# Patient Record
Sex: Female | Born: 1970 | Race: Black or African American | Hispanic: No | Smoking: Never smoker
Health system: Southern US, Community
[De-identification: ages and names within clinical notes are randomized; demographics above are authoritative.]

## PROBLEM LIST (undated history)

## (undated) DIAGNOSIS — M26629 Arthralgia of temporomandibular joint, unspecified side: Secondary | ICD-10-CM

## (undated) DIAGNOSIS — E559 Vitamin D deficiency, unspecified: Secondary | ICD-10-CM

## (undated) DIAGNOSIS — I2699 Other pulmonary embolism without acute cor pulmonale: Secondary | ICD-10-CM

## (undated) DIAGNOSIS — E669 Obesity, unspecified: Secondary | ICD-10-CM

## (undated) DIAGNOSIS — G9332 Myalgic encephalomyelitis/chronic fatigue syndrome: Secondary | ICD-10-CM

## (undated) DIAGNOSIS — M2550 Pain in unspecified joint: Secondary | ICD-10-CM

## (undated) DIAGNOSIS — D649 Anemia, unspecified: Secondary | ICD-10-CM

## (undated) DIAGNOSIS — F419 Anxiety disorder, unspecified: Secondary | ICD-10-CM

## (undated) DIAGNOSIS — H15009 Unspecified scleritis, unspecified eye: Secondary | ICD-10-CM

## (undated) DIAGNOSIS — F329 Major depressive disorder, single episode, unspecified: Secondary | ICD-10-CM

## (undated) DIAGNOSIS — R0600 Dyspnea, unspecified: Secondary | ICD-10-CM

## (undated) DIAGNOSIS — R600 Localized edema: Secondary | ICD-10-CM

## (undated) DIAGNOSIS — E119 Type 2 diabetes mellitus without complications: Secondary | ICD-10-CM

## (undated) DIAGNOSIS — M549 Dorsalgia, unspecified: Secondary | ICD-10-CM

## (undated) DIAGNOSIS — D219 Benign neoplasm of connective and other soft tissue, unspecified: Secondary | ICD-10-CM

## (undated) DIAGNOSIS — R5382 Chronic fatigue, unspecified: Secondary | ICD-10-CM

## (undated) DIAGNOSIS — J45909 Unspecified asthma, uncomplicated: Secondary | ICD-10-CM

## (undated) DIAGNOSIS — K219 Gastro-esophageal reflux disease without esophagitis: Secondary | ICD-10-CM

## (undated) DIAGNOSIS — K5900 Constipation, unspecified: Secondary | ICD-10-CM

## (undated) DIAGNOSIS — F32A Depression, unspecified: Secondary | ICD-10-CM

## (undated) DIAGNOSIS — G43909 Migraine, unspecified, not intractable, without status migrainosus: Secondary | ICD-10-CM

## (undated) DIAGNOSIS — R7303 Prediabetes: Secondary | ICD-10-CM

## (undated) DIAGNOSIS — E8881 Metabolic syndrome: Secondary | ICD-10-CM

## (undated) DIAGNOSIS — M1710 Unilateral primary osteoarthritis, unspecified knee: Secondary | ICD-10-CM

## (undated) HISTORY — DX: Localized edema: R60.0

## (undated) HISTORY — DX: Myalgic encephalomyelitis/chronic fatigue syndrome: G93.32

## (undated) HISTORY — DX: Prediabetes: R73.03

## (undated) HISTORY — DX: Dyspnea, unspecified: R06.00

## (undated) HISTORY — PX: SHOULDER SURGERY: SHX246

## (undated) HISTORY — DX: Depression, unspecified: F32.A

## (undated) HISTORY — DX: Constipation, unspecified: K59.00

## (undated) HISTORY — DX: Unspecified scleritis, unspecified eye: H15.009

## (undated) HISTORY — DX: Pain in unspecified joint: M25.50

## (undated) HISTORY — DX: Unilateral primary osteoarthritis, unspecified knee: M17.10

## (undated) HISTORY — DX: Vitamin D deficiency, unspecified: E55.9

## (undated) HISTORY — DX: Migraine, unspecified, not intractable, without status migrainosus: G43.909

## (undated) HISTORY — DX: Anxiety disorder, unspecified: F41.9

## (undated) HISTORY — DX: Metabolic syndrome: E88.810

## (undated) HISTORY — PX: ABLATION: SHX5711

## (undated) HISTORY — DX: Gastro-esophageal reflux disease without esophagitis: K21.9

## (undated) HISTORY — DX: Chronic fatigue, unspecified: R53.82

## (undated) HISTORY — DX: Metabolic syndrome: E88.81

## (undated) HISTORY — DX: Dorsalgia, unspecified: M54.9

---
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
## 1997-11-25 ENCOUNTER — Emergency Department (HOSPITAL_COMMUNITY): Admission: EM | Admit: 1997-11-25 | Discharge: 1997-11-25 | Payer: Self-pay | Admitting: Emergency Medicine

## 1997-11-25 ENCOUNTER — Encounter: Payer: Self-pay | Admitting: Emergency Medicine

## 1999-05-06 ENCOUNTER — Encounter: Payer: Self-pay | Admitting: Obstetrics & Gynecology

## 1999-05-06 ENCOUNTER — Ambulatory Visit (HOSPITAL_COMMUNITY): Admission: RE | Admit: 1999-05-06 | Discharge: 1999-05-06 | Payer: Self-pay | Admitting: Obstetrics & Gynecology

## 1999-07-21 ENCOUNTER — Inpatient Hospital Stay (HOSPITAL_COMMUNITY): Admission: AD | Admit: 1999-07-21 | Discharge: 1999-07-21 | Payer: Self-pay | Admitting: *Deleted

## 1999-07-22 ENCOUNTER — Inpatient Hospital Stay (HOSPITAL_COMMUNITY): Admission: AD | Admit: 1999-07-22 | Discharge: 1999-07-24 | Payer: Self-pay | Admitting: Obstetrics and Gynecology

## 1999-08-29 ENCOUNTER — Emergency Department (HOSPITAL_COMMUNITY): Admission: EM | Admit: 1999-08-29 | Discharge: 1999-08-29 | Payer: Self-pay | Admitting: *Deleted

## 2000-02-18 ENCOUNTER — Emergency Department (HOSPITAL_COMMUNITY): Admission: EM | Admit: 2000-02-18 | Discharge: 2000-02-18 | Payer: Self-pay | Admitting: Emergency Medicine

## 2000-02-18 ENCOUNTER — Encounter: Payer: Self-pay | Admitting: Emergency Medicine

## 2000-09-07 ENCOUNTER — Other Ambulatory Visit: Admission: RE | Admit: 2000-09-07 | Discharge: 2000-09-07 | Payer: Self-pay | Admitting: Obstetrics and Gynecology

## 2000-10-04 ENCOUNTER — Inpatient Hospital Stay (HOSPITAL_COMMUNITY): Admission: AD | Admit: 2000-10-04 | Discharge: 2000-10-04 | Payer: Self-pay | Admitting: Obstetrics and Gynecology

## 2000-10-16 ENCOUNTER — Inpatient Hospital Stay (HOSPITAL_COMMUNITY): Admission: AD | Admit: 2000-10-16 | Discharge: 2000-10-16 | Payer: Self-pay | Admitting: Obstetrics and Gynecology

## 2000-11-27 ENCOUNTER — Ambulatory Visit (HOSPITAL_COMMUNITY): Admission: RE | Admit: 2000-11-27 | Discharge: 2000-11-27 | Payer: Self-pay | Admitting: Obstetrics and Gynecology

## 2000-11-27 ENCOUNTER — Encounter: Payer: Self-pay | Admitting: Obstetrics and Gynecology

## 2001-01-03 HISTORY — PX: TUBAL LIGATION: SHX77

## 2001-01-12 ENCOUNTER — Encounter: Payer: Self-pay | Admitting: Obstetrics and Gynecology

## 2001-01-12 ENCOUNTER — Ambulatory Visit (HOSPITAL_COMMUNITY): Admission: RE | Admit: 2001-01-12 | Discharge: 2001-01-12 | Payer: Self-pay | Admitting: Obstetrics and Gynecology

## 2001-03-09 ENCOUNTER — Encounter: Payer: Self-pay | Admitting: Obstetrics and Gynecology

## 2001-03-09 ENCOUNTER — Ambulatory Visit (HOSPITAL_COMMUNITY): Admission: RE | Admit: 2001-03-09 | Discharge: 2001-03-09 | Payer: Self-pay | Admitting: Obstetrics and Gynecology

## 2001-04-26 ENCOUNTER — Inpatient Hospital Stay (HOSPITAL_COMMUNITY): Admission: AD | Admit: 2001-04-26 | Discharge: 2001-04-28 | Payer: Self-pay | Admitting: Obstetrics and Gynecology

## 2001-06-15 ENCOUNTER — Ambulatory Visit (HOSPITAL_COMMUNITY): Admission: RE | Admit: 2001-06-15 | Discharge: 2001-06-15 | Payer: Self-pay | Admitting: Obstetrics and Gynecology

## 2001-08-26 ENCOUNTER — Emergency Department (HOSPITAL_COMMUNITY): Admission: EM | Admit: 2001-08-26 | Discharge: 2001-08-26 | Payer: Self-pay | Admitting: Emergency Medicine

## 2002-07-09 ENCOUNTER — Inpatient Hospital Stay (HOSPITAL_COMMUNITY): Admission: AD | Admit: 2002-07-09 | Discharge: 2002-07-09 | Payer: Self-pay | Admitting: Obstetrics and Gynecology

## 2003-04-11 ENCOUNTER — Emergency Department (HOSPITAL_COMMUNITY): Admission: EM | Admit: 2003-04-11 | Discharge: 2003-04-11 | Payer: Self-pay | Admitting: Emergency Medicine

## 2003-04-17 ENCOUNTER — Encounter: Admission: RE | Admit: 2003-04-17 | Discharge: 2003-05-01 | Payer: Self-pay | Admitting: Orthopedic Surgery

## 2005-03-19 ENCOUNTER — Emergency Department (HOSPITAL_COMMUNITY): Admission: AD | Admit: 2005-03-19 | Discharge: 2005-03-19 | Payer: Self-pay | Admitting: Family Medicine

## 2008-01-31 ENCOUNTER — Emergency Department (HOSPITAL_COMMUNITY): Admission: EM | Admit: 2008-01-31 | Discharge: 2008-01-31 | Payer: Self-pay | Admitting: Emergency Medicine

## 2008-02-01 ENCOUNTER — Emergency Department (HOSPITAL_COMMUNITY): Admission: EM | Admit: 2008-02-01 | Discharge: 2008-02-01 | Payer: Self-pay | Admitting: Family Medicine

## 2008-02-01 ENCOUNTER — Emergency Department (HOSPITAL_COMMUNITY): Admission: EM | Admit: 2008-02-01 | Discharge: 2008-02-01 | Payer: Self-pay | Admitting: Emergency Medicine

## 2008-12-29 ENCOUNTER — Encounter: Admission: RE | Admit: 2008-12-29 | Discharge: 2008-12-29 | Payer: Self-pay | Admitting: Internal Medicine

## 2009-10-19 ENCOUNTER — Emergency Department (HOSPITAL_COMMUNITY): Admission: EM | Admit: 2009-10-19 | Discharge: 2009-10-20 | Payer: Self-pay | Admitting: Emergency Medicine

## 2010-05-21 NOTE — Op Note (Signed)
[facility]  Patient:    Jaime Castaneda, Jaime Castaneda Visit Number: [id number] MRN: [id number]          Service Type: DSU Location: [facility] Attending Physician:  Jaymes Graff A Dictated by:   Pierre Bali Normand Sloop, M.D. Proc. Date: 06/15/01 Admit Date:  06/15/2001                             Operative Report  PREOPERATIVE DIAGNOSIS:       Multiparity, desires sterilization.  POSTOPERATIVE DIAGNOSIS:      Multiparity, desires sterilization.  PROCEDURE:                    Tubal ligation with bipolar cautery using Klepingers.  SURGEON:                      Naima A. Normand Sloop, M.D.  ANESTHESIA:                   General endotracheal.  ESTIMATED BLOOD LOSS:         Minimal.  COMPLICATIONS:                None.  FINDINGS:                     Normal pelvic anatomy.  Normal-appearing, anteverted uterus.  Normal-appearing tubes and ovaries.  Normal appendix. Normal-appearing abdominal anatomy.  Normal-appearing liver.  On exam, the vulva and vagina were within normal limits.  The patient had a normal-sized, anteverted uterus with no adnexal masses.  PROCEDURE IN DETAIL:          The patient was taken to the operating room, where she was given general anesthesia, placed in the dorsal lithotomy position, prepped and draped in a normal sterile fashion.  A bivalve speculum was placed into the vagina.  The anterior lip of the cervix was grasped with a single-tooth tenaculum.  An acorn manipulator was then placed into the cervix as a means of manipulating the uterus.  The bivalve speculum was then removed.  Attention was then turned to the patients abdomen, where a 10-mm infraumbilical horizontal incision was made with a knife.  A Veress needle was placed into the abdominal cavity while tenting the abdominal wall at a 45-degree angle.  Intra-abdominal placement was confirmed with a fluid-filled syringe and decrease in CO2 pressure.  The patient was insufflated with about 4  L of CO2 gas.  A 10-mm trocar was then placed without difficulty. Intra-abdominal pressure was confirmed with the laparoscope.  Before the incision was made, 7 cc of 0.25% Marcaine was injected into the skin incision site about 2 cm above the symphysis pubis.  A total of 3 cc of 0.25% Marcaine was injected.  A small, 5-mm infraumbilical incision was made and a 5-mm trocar was placed under direct visualization with the laparoscope.  The abdominal anatomy was as above.  The right fallopian tube was identified, followed out to the fimbriated end and cauterized x3 in the midisthmic portion of the tube.  Hemostasis was assured.  The left fallopian tube was identified, followed out to the fimbriated end and, in a similar fashion, was cauterized x3.  Hemostasis was assured.  All instruments were removed from the abdomen. The trocars were removed under direct visualization with the laparoscope and noted to be hemostatic.  The umbilical fascia was closed with figure-of-eight stitch.  The skin incisions were  closed with subcuticular stitches of 3-0 Vicryl.  Again, the patient had normal tubes, ovaries and normal uterus. Sponge, lap and needle counts were correct x2.  The tenaculum and acorn were removed, with good hemostasis noted at the cervix and tenaculum site.  The patient went to the recovery room in stable condition. Dictated by:   Pierre Bali. Normand Sloop, M.D. Attending Physician:  Jaymes Graff A DD:  06/15/01 TD:  06/17/01 Job: [id number]

## 2010-05-21 NOTE — H&P (Signed)
[facility]  Patient:    Jaime Castaneda, Jaime Castaneda Visit Number: [id number] MRN: [id number]          Service Type: OBS Location: 910A 9118 01 Attending Physician:  Leonard Schwartz Dictated by:   Saverio Danker, C.N.M. Admit Date:  04/26/2001 Discharge Date: 04/28/2001                           History and Physical  HISTORY OF PRESENT ILLNESS:   Jaime Castaneda is a 40 year old married black female, gravida 2, para 1-0-0-1, at 39-5/7ths weeks, who presents complaining of uterine contractions every 3-5 minutes from about 2 a.m. and leaking clear fluid since about 9 p.m.  She denies any nausea, vomiting, headaches or visual disturbances.  She reports positive fetal movement.  Her pregnancy has been followed at [facility] by the certified nurse midwife service and has been at risk for: 1. Obesity.  2. History of rapid labor.  3. History of questionable LMP and irregular cycles.  4. History of hyperemesis with this pregnancy.  5. History of placenta previa, now resolved.  Her group B strep is negative with this pregnancy.  OBSTETRICAL/GYNECOLOGICAL HISTORY:                      She is a gravida 2, para 1-0-0-1 with an EDC of April 28, 2001, by ultrasound.  She delivered a viable female infant in July of 2001 who weighed 8 pounds 5 ounces at 41-2/7ths weeks after a five-hour labor. She delivered vaginally with no complications and actually delivered precipitously and was attended in labor by Dr. Trudie Reed.  OTHER GYNECOLOGICAL HISTORY:  She reports a history of abnormal Pap several years ago; her repeat, however, was normal and no further treatment was necessary.  She reports occasional yeast infections.  GENERAL MEDICAL HISTORY:      She has no known drug allergies.  She reports having had the usual childhood diseases.  She has a history of anemia in the past and her only surgery was a hernia repair at age 20 months and her  only hospitalization was for childbirth.  FAMILY HISTORY:               Significant only for maternal grandfather with mitral valve prolapse and mother and aunt with chronic hypertension.  GENETIC HISTORY:              Negative.  SOCIAL HISTORY:               She is married to Dionicia Abler who is involved and supportive.  They are both employed full time.  They are of the [place] faith.  They deny any illicit drug use, alcohol or smoking with this pregnancy.  PRENATAL LABORATORY:          Her blood type is A positive.  Her antibody screen is negative.  Sickle cell trait is negative.  Syphilis is nonreactive. Rubella is immune.  Hepatitis B surface antigen is negative.  GC and Chlamydia are both negative.  Pap was within normal limits.  Her one-hour Glucola is 101 and her 36-week beta strep was negative.  PHYSICAL EXAMINATION:  VITAL SIGNS:                  Her vital signs are stable, she is afebrile.  HEENT:  Grossly within normal limits.  HEART:                        Regular rhythm and rate.  CHEST:                        Clear.  BREASTS:                      Soft and nontender.  ABDOMEN:                      Gravid with uterine contractions every 3-5 minutes.  Her fetal heart rate is reactive and reassuring.  PELVIC EXAM:                  Reveals clear fluid, fern and Nitrazine positive and the cervix is 3 cm, 50%, vertex at a -2 station.  EXTREMITIES:                  Within normal limits.  ASSESSMENT:                   1. Intrauterine pregnancy at term.                               2. Spontaneous rupture of membranes since about                                  9 p.m. with clear fluid.                               3. Negative group B Streptococcus.                               4. Early labor.  PLAN:                         Admit to labor and delivery, to follow routine CNM orders, to allow the patient to ambulate at this time to augment  labor, and the patient plans on an epidural as she progresses in labor.  We also notified Dr. Janine Limbo of patients admission. Dictated by:   Vance Gather Duplantis, C.N.M. Attending Physician:  Leonard Schwartz DD:  04/26/01 TD:  04/26/01 Job: [phone] [id number]

## 2010-05-21 NOTE — H&P (Signed)
[facility]  Patient:    Jaime Castaneda, Jaime Castaneda Visit Number: [id number] MRN: [id number]          Service Type: OBS Location: 910A 9118 01 Attending Physician:  Leonard Schwartz Dictated by:   Pierre Bali Normand Sloop, M.D. Admit Date:  04/26/2001 Discharge Date: 04/28/2001                           History and Physical  CHIEF COMPLAINT:              1. Desires sterilization.                               2. Status post vaginal delivery on                                  April 26, 2001.  HISTORY OF PRESENT ILLNESS:   The patient is a 40 year old, G2, P2, who desires sterilization.  All forms of birth control were reviewed, but the patient still desires sterilization.  ALLERGIES:                    No known drug allergies.  MEDICATIONS:                  Prenatal vitamins and iron sulfate.  PAST MEDICAL HISTORY:         Unremarkable.  FAMILY HISTORY:               Unremarkable.  PHYSICAL EXAMINATION:  VITAL SIGNS:                  Temperature 97.5, pulse 88, respiratory rate 16, blood pressure 139/66.  GENERAL:                      She is a well-developed, well-nourished female in no apparent distress.  HEENT:                        Head is normocephalic, atraumatic.  NECK:                         Free range of motion.  LUNGS:                        Clear to auscultation bilaterally.  HEART:                        Regular rate and rhythm.  ABDOMEN:                      Soft and nontender.  GENITAL:                      Deferred into OR but found to be normal in the office at her last exam.  Vulva and vaginal exam was normal.  Cervix without lesions, normal-appearing.  Uterus is normal size, anteverted, and nontender. Adnexa has no masses bilaterally.  EXTREMITIES:                  No clubbing, cyanosis, or edema.  SKIN:  Moist mucous membranes.  IMPRESSION:                   The patient is multiparity and  desires sterilization.  PLAN:                         Bilateral tubal ligation by way of laparoscopy. The risk of failure rate of about 1 in 200 to 1 in 300 was discussed with the patient.  The risk of the surgery was discussed with the patient as they are, but not limited to, bleeding, infection, damage to internal organs such as bowel and bladder.  All forms of birth control were reviewed.  Once again, the patient still chose to have tubal ligation. Dictated by:   Pierre Bali. Normand Sloop, M.D. Attending Physician:  Leonard Schwartz DD:  06/15/01 TD:  06/15/01 Job: [id number]

## 2010-05-21 NOTE — H&P (Signed)
[facility]  Patient:    Jaime Castaneda, Jaime Castaneda                        MRN: [id number] Adm. Date:  [id number] Attending:  Shaune Spittle Dictator:   Mack Guise, C.N.M.                         History and Physical  HISTORY OF PRESENT ILLNESS:   Ms. Jaime Castaneda is a 40 year old gravida 1, para 0 at 41-3/7th weeks, EDD July 13, 1999, who presents with increasing labor.  She is scheduled for induction of labor already at 6 a.m. this morning.  The patient had been seen previously in MAU at approximately midnight and was 2 cm at that time and went home with Ambien.  She reports positive fetal movement, no bleeding, no rupture of membranes.  Her pregnancy has been followed by the M.D. service at [facility] and has been unremarkable. She transferred to care to our practice in the second trimester and is group B strep negative.  This patient was initially evaluated at the office of [facility] on December 02, 1998 at approximately [redacted] weeks gestation.  Her pregnancy was confirmed with pregnancy ultrasonography and has remained essentially unremarkable.  She transferred care to [facility] in the early second trimester and her pregnancy has been unremarkable throughout the prenatal course.  The growth of the uterine fundus has been noted to be compatible in size with her dates.  Systolic blood pressure has ranged from 100 to 130 and diastolic blood pressure has ranged from 60 to 80.  There has been no significant proteinuria. At 36 weeks culture of the vaginal tract is negative for group B strep.  MEDICAL HISTORY:              Unremarkable.  OBJECTIVE DATA  VITAL SIGNS:                  Stable.  Afebrile.  HEENT:                        Unremarkable.  HEART:                        Regular rate and rhythm.  LUNGS:                        Clear.  ABDOMEN:                      Gravid in its contour.  Uterine fundus is noted to extend 40 cm above  the level of the pubic symphysis.  Leopold maneuver finds the infant to be in longitudinal lie, cephalic presentation and the estimated fetal weight is 8 pounds.  CERVICAL EXAM:                Digital exam of the cervix by R.N. in MAU finds it to be 3 to 4 cm dilated, 90% effaced with cephalic presenting part at a -1 station.  The baseline of the fetal heart rate monitor is 130 to 140 with average long-term variability.  Reactivity present with no periodic changes. The patient is contracting every 2 to 5 minutes.  ASSESSMENT:                   1. Intrauterine pregnancy at 41-3/7th weeks.  2. Early labor.  PLAN:                         Admit per Dr. Dierdre Forth.  The patient may ambulate.  She may have Stadol 1 mg plus Phenergan 12.5 mg IV x 1 as needed for pain.  Routine M.D. orders.  If no labor progress by 6 a.m., will begin Pitocin for augmentation of labor. DD:  07/22/99 TD:  07/22/99 Job: [id number]

## 2010-10-18 DIAGNOSIS — R030 Elevated blood-pressure reading, without diagnosis of hypertension: Secondary | ICD-10-CM | POA: Insufficient documentation

## 2010-11-08 ENCOUNTER — Emergency Department (HOSPITAL_BASED_OUTPATIENT_CLINIC_OR_DEPARTMENT_OTHER)
Admission: EM | Admit: 2010-11-08 | Discharge: 2010-11-08 | Disposition: A | Discharge status: Home / Self Care | Payer: Medicaid Other | Attending: Emergency Medicine | Admitting: Emergency Medicine

## 2010-11-08 ENCOUNTER — Encounter: Payer: Self-pay | Admitting: *Deleted

## 2010-11-08 ENCOUNTER — Emergency Department (INDEPENDENT_AMBULATORY_CARE_PROVIDER_SITE_OTHER): Payer: Medicaid Other

## 2010-11-08 DIAGNOSIS — R29810 Facial weakness: Secondary | ICD-10-CM

## 2010-11-08 HISTORY — DX: Arthralgia of temporomandibular joint, unspecified side: M26.629

## 2010-11-08 LAB — CBC
Hemoglobin: 9.7 g/dL — ABNORMAL LOW (ref 12.0–15.0)
MCH: 23.1 pg — ABNORMAL LOW (ref 26.0–34.0)
MCV: 76.4 fL — ABNORMAL LOW (ref 78.0–100.0)
RBC: 4.2 MIL/uL (ref 3.87–5.11)

## 2010-11-08 LAB — COMPREHENSIVE METABOLIC PANEL
Alkaline Phosphatase: 60 U/L (ref 39–117)
BUN: 6 mg/dL (ref 6–23)
CO2: 26 mEq/L (ref 19–32)
GFR calc Af Amer: >90 mL/min (ref >90)
GFR calc non Af Amer: >90 mL/min (ref >90)
Glucose, Bld: 93 mg/dL (ref 70–99)
Potassium: 3.2 mEq/L — ABNORMAL LOW (ref 3.5–5.1)
Total Protein: 8.5 g/dL — ABNORMAL HIGH (ref 6.0–8.3)

## 2010-11-08 LAB — DIFFERENTIAL
Basophils Relative: 0 % (ref 0–1)
Eosinophils Absolute: 0.1 10*3/uL (ref 0.0–0.7)
Eosinophils Relative: 1 % (ref 0–5)
Lymphs Abs: 2 10*3/uL (ref 0.7–4.0)
Monocytes Absolute: 0.3 10*3/uL (ref 0.1–1.0)
Monocytes Relative: 4 % (ref 3–12)
Neutrophils Relative %: 68 % (ref 43–77)

## 2010-11-08 MED ORDER — ALPRAZOLAM 0.5 MG PO TABS: 0.5000 mg | ORAL_TABLET | Freq: Every evening | ORAL | Status: AC | PRN

## 2010-11-08 NOTE — ED Notes (Signed)
MD at bedside. 

## 2010-11-08 NOTE — ED Provider Notes (Addendum)
History    Scribed for Geoffery Lyons, MD, the patient was seen in room MH08/MH08. This chart was scribed by Katha Cabal.   [name]: [id number] Arrival date & time: 11/08/2010  4:35 PM   First MD Initiated Contact with Patient 11/08/10 1638      Chief Complaint  Patient presents with  . Facial Droop    (Consider location/radiation/quality/duration/timing/severity/associated sxs/prior treatment) HPI Jaime Castaneda is a 40 y.o. female who presents to the Emergency Department complaining of sudden onset of mild right facial droop last night.  Episode is unchanged.  Patient denies all pain, numbness and weakness.  Patient denies blurred vision but repots seeing a "light on right side in my peripheral vision."    Patient states "jawline just wants to go that way"   Patient reports dental work last week with several numbing injections.  Patient evaluated by PCP who told patient she had elevated BP.  Patient reports elevated stress with small children at home and stress at her job.  Patient states "feel like I need a good night rest." Patient was unable to get appointment today and was referred to ED by [facility].  Patient was diagnosed with TMJ several years ago.       Past Medical History  Diagnosis Date  . TMJ arthralgia     Past Surgical History  Procedure Date  . Tubal ligation   . Shoulder surgery     No family history on file.  History  Substance Use Topics  . Smoking status: Never Smoker   . Smokeless tobacco: Not on file  . Alcohol Use: No    OB History    Grav Para Term Preterm Abortions TAB SAB Ect Mult Living                  Review of Systems  All other systems reviewed and are negative.    Allergies  Review of patient's allergies indicates no known allergies.  Home Medications   Current Outpatient Rx  Name Route Sig Dispense Refill  . HAIR/SKIN/NAILS PO TABS Oral Take 1 tablet by mouth daily.        BP 136/85  Pulse 76  Temp(Src) 98 F  (36.7 C) (Oral)  Resp 20  Ht 5\' 8"  (1.727 m)  Wt 293 lb (132.904 kg)  BMI 44.55 kg/m2  SpO2 100%  LMP 11/08/2010  Physical Exam  Constitutional: She is oriented to person, place, and time. She appears well-developed and well-nourished. No distress.  HENT:  Head: Normocephalic and atraumatic.  Mouth/Throat: Oropharynx is clear and moist and mucous membranes are normal.  Eyes: Conjunctivae and EOM are normal. Pupils are equal, round, and reactive to light.  Neck: Normal range of motion. Neck supple.  Cardiovascular: Normal rate and regular rhythm.   Pulmonary/Chest: Effort normal and breath sounds normal. No respiratory distress.  Abdominal: There is no tenderness.  Musculoskeletal: Normal range of motion.  Neurological: She is alert and oriented to person, place, and time. She has normal strength. No cranial nerve deficit. Coordination normal.       No facial droop, no asymmetry, teeth aligned,   Skin: Skin is warm, dry and intact.  Psychiatric: She has a normal mood and affect. Her speech is normal and behavior is normal.    ED Course  Procedures (including critical care time)   DIAGNOSTIC STUDIES: Oxygen Saturation is 100% on room air, normal by my interpretation.    COORDINATION OF CARE:  4:55 PM  Physical exam complete.  Will order CT Head.   Orders Placed This Encounter  Procedures  . CT Head Wo Contrast  . CBC  . Differential  . Comprehensive metabolic panel    LABS / RADIOLOGY:   Labs Reviewed  CBC - Abnormal; Notable for the following:    Hemoglobin 9.7 (*)    HCT 32.1 (*)    MCV 76.4 (*)    MCH 23.1 (*)    RDW 16.7 (*)    Platelets 407 (*)    All other components within normal limits  COMPREHENSIVE METABOLIC PANEL - Abnormal; Notable for the following:    Potassium 3.2 (*)    Total Protein 8.5 (*)    Total Bilirubin 0.2 (*)    All other components within normal limits  DIFFERENTIAL   Ct Head Wo Contrast  11/08/2010  *RADIOLOGY REPORT*  Clinical  Data: Left facial droop  CT HEAD WITHOUT CONTRAST  Technique:  Contiguous axial images were obtained from the base of the skull through the vertex without contrast.  Comparison: CT sinus dated 12/29/2008  Findings: Motion degraded images.  No evidence of parenchymal hemorrhage or extra-axial fluid collection. No mass lesion, mass effect, or midline shift.  No CT evidence of acute infarction.  The visualized paranasal sinuses are essentially clear. The mastoid air cells are unopacified.  No evidence of calvarial fracture.  IMPRESSION: Motion degraded images.  No evidence of acute intracranial abnormality.  Original Report Authenticated By: Charline Bills, M.D.         MDM   MDM:  There are no neurological deficits on exam.  This does not appear to be a Bell's, or a CVA.  Labs, CT appear normal except for anemia which she normally has.  Will discharge, follow up with pcp if not improving.     MEDICATIONS GIVEN IN THE E.D. Scheduled Meds:   Continuous Infusions:       IMPRESSION: No diagnosis found.   DISCHARGE MEDICATIONS: New Prescriptions   No medications on file      I personally performed the services described in this documentation, which was scribed in my presence. The recorded information has been reviewed and considered.   Scribe            Geoffery Lyons, MD 11/08/10 1806  Geoffery Lyons, MD 11/08/10 [phone]

## 2010-11-08 NOTE — ED Notes (Signed)
Pt c/o right side facial drawing that began last night and has worsen today. Pt reports numbness/tingling in her left hand only. Pt sts she is having to bite down to keep her face from drawing. Pt also c/o slight dizziness and blurred vision.

## 2010-11-08 NOTE — ED Notes (Signed)
Pt sts hse has been under a lot of stress lately and was sent here by her PMD ([facility]).

## 2011-05-15 ENCOUNTER — Encounter (HOSPITAL_BASED_OUTPATIENT_CLINIC_OR_DEPARTMENT_OTHER): Payer: Self-pay | Admitting: Emergency Medicine

## 2011-05-15 ENCOUNTER — Emergency Department (HOSPITAL_BASED_OUTPATIENT_CLINIC_OR_DEPARTMENT_OTHER)
Admission: EM | Admit: 2011-05-15 | Discharge: 2011-05-15 | Disposition: A | Discharge status: Home / Self Care | Payer: BC Managed Care – PPO | Attending: Emergency Medicine | Admitting: Emergency Medicine

## 2011-05-15 DIAGNOSIS — J069 Acute upper respiratory infection, unspecified: Secondary | ICD-10-CM

## 2011-05-15 DIAGNOSIS — J029 Acute pharyngitis, unspecified: Secondary | ICD-10-CM | POA: Insufficient documentation

## 2011-05-15 DIAGNOSIS — J3489 Other specified disorders of nose and nasal sinuses: Secondary | ICD-10-CM | POA: Insufficient documentation

## 2011-05-15 DIAGNOSIS — R059 Cough, unspecified: Secondary | ICD-10-CM | POA: Insufficient documentation

## 2011-05-15 DIAGNOSIS — R05 Cough: Secondary | ICD-10-CM

## 2011-05-15 LAB — RAPID STREP SCREEN (MED CTR MEBANE ONLY): Streptococcus, Group A Screen (Direct): NEGATIVE

## 2011-05-15 MED ORDER — BENZONATATE 100 MG PO CAPS: 100.0000 mg | ORAL_CAPSULE | Freq: Three times a day (TID) | ORAL | Status: AC

## 2011-05-15 NOTE — Discharge Instructions (Signed)
Cough, Adult   A cough is a reflex that helps clear your throat and airways. It can help heal the body or may be a reaction to an irritated airway. A cough may only last 2 or 3 weeks (acute) or may last more than 8 weeks (chronic).   CAUSES  Acute cough:  · Viral or bacterial infections.  Chronic cough:  · Infections.  · Allergies.  · Asthma.  · Post-nasal drip.  · Smoking.  · Heartburn or acid reflux.  · Some medicines.  · Chronic lung problems (COPD).  · Cancer.  SYMPTOMS   · Cough.  · Fever.  · Chest pain.  · Increased breathing rate.  · High-pitched whistling sound when breathing (wheezing).  · Colored mucus that you cough up (sputum).  TREATMENT   · A bacterial cough may be treated with antibiotic medicine.  · A viral cough must run its course and will not respond to antibiotics.  · Your caregiver may recommend other treatments if you have a chronic cough.  HOME CARE INSTRUCTIONS   · Only take over-the-counter or prescription medicines for pain, discomfort, or fever as directed by your caregiver. Use cough suppressants only as directed by your caregiver.  · Use a cold steam vaporizer or humidifier in your bedroom or home to help loosen secretions.  · Sleep in a semi-upright position if your cough is worse at night.  · Rest as needed.  · Stop smoking if you smoke.  SEEK IMMEDIATE MEDICAL CARE IF:   · You have pus in your sputum.  · Your cough starts to worsen.  · You cannot control your cough with suppressants and are losing sleep.  · You begin coughing up blood.  · You have difficulty breathing.  · You develop pain which is getting worse or is uncontrolled with medicine.  · You have a fever.  MAKE SURE YOU:   · Understand these instructions.  · Will watch your condition.  · Will get help right away if you are not doing well or get worse.  Document Released: 06/18/2010 Document Revised: 12/09/2010 Document Reviewed: 06/18/2010  ExitCare® Patient Information ©2012 [employer].  Upper Respiratory Infection,  Adult  An upper respiratory infection (URI) is also sometimes known as the common cold. The upper respiratory tract includes the nose, sinuses, throat, trachea, and bronchi. Bronchi are the airways leading to the lungs. Most people improve within 1 week, but symptoms can last up to 2 weeks. A residual cough may last even longer.   CAUSES  Many different viruses can infect the tissues lining the upper respiratory tract. The tissues become irritated and inflamed and often become very moist. Mucus production is also common. A cold is contagious. You can easily spread the virus to others by oral contact. This includes kissing, sharing a glass, coughing, or sneezing. Touching your mouth or nose and then touching a surface, which is then touched by another person, can also spread the virus.  SYMPTOMS   Symptoms typically develop 1 to 3 days after you come in contact with a cold virus. Symptoms vary from person to person. They may include:  · Runny nose.  · Sneezing.  · Nasal congestion.  · Sinus irritation.  · Sore throat.  · Loss of voice (laryngitis).  · Cough.  · Fatigue.  · Muscle aches.  · Loss of appetite.  · Headache.  · Low-grade fever.  DIAGNOSIS   You might diagnose your own cold based on familiar symptoms, since   most people get a cold 2 to 3 times a year. Your caregiver can confirm this based on your exam. Most importantly, your caregiver can check that your symptoms are not due to another disease such as strep throat, sinusitis, pneumonia, asthma, or epiglottitis. Blood tests, throat tests, and X-rays are not necessary to diagnose a common cold, but they may sometimes be helpful in excluding other more serious diseases. Your caregiver will decide if any further tests are required.  RISKS AND COMPLICATIONS   You may be at risk for a more severe case of the common cold if you smoke cigarettes, have chronic heart disease (such as heart failure) or lung disease (such as asthma), or if you have a weakened immune  system. The very young and very old are also at risk for more serious infections. Bacterial sinusitis, middle ear infections, and bacterial pneumonia can complicate the common cold. The common cold can worsen asthma and chronic obstructive pulmonary disease (COPD). Sometimes, these complications can require emergency medical care and may be life-threatening.  PREVENTION   The best way to protect against getting a cold is to practice good hygiene. Avoid oral or hand contact with people with cold symptoms. Wash your hands often if contact occurs. There is no clear evidence that vitamin C, vitamin E, echinacea, or exercise reduces the chance of developing a cold. However, it is always recommended to get plenty of rest and practice good nutrition.  TREATMENT   Treatment is directed at relieving symptoms. There is no cure. Antibiotics are not effective, because the infection is caused by a virus, not by bacteria. Treatment may include:  · Increased fluid intake. Sports drinks offer valuable electrolytes, sugars, and fluids.  · Breathing heated mist or steam (vaporizer or shower).  · Eating chicken soup or other clear broths, and maintaining good nutrition.  · Getting plenty of rest.  · Using gargles or lozenges for comfort.  · Controlling fevers with ibuprofen or acetaminophen as directed by your caregiver.  · Increasing usage of your inhaler if you have asthma.  Zinc gel and zinc lozenges, taken in the first 24 hours of the common cold, can shorten the duration and lessen the severity of symptoms. Pain medicines may help with fever, muscle aches, and throat pain. A variety of non-prescription medicines are available to treat congestion and runny nose. Your caregiver can make recommendations and may suggest nasal or lung inhalers for other symptoms.   HOME CARE INSTRUCTIONS   · Only take over-the-counter or prescription medicines for pain, discomfort, or fever as directed by your caregiver.  · Use a warm mist humidifier  or inhale steam from a shower to increase air moisture. This may keep secretions moist and make it easier to breathe.  · Drink enough water and fluids to keep your urine clear or pale yellow.  · Rest as needed.  · Return to work when your temperature has returned to normal or as your caregiver advises. You may need to stay home longer to avoid infecting others. You can also use a face mask and careful hand washing to prevent spread of the virus.  SEEK MEDICAL CARE IF:   · After the first few days, you feel you are getting worse rather than better.  · You need your caregiver's advice about medicines to control symptoms.  · You develop chills, worsening shortness of breath, or brown or red sputum. These may be signs of pneumonia.  · You develop yellow or brown nasal discharge or   pain in the face, especially when you bend forward. These may be signs of sinusitis.  · You develop a fever, swollen neck glands, pain with swallowing, or white areas in the back of your throat. These may be signs of strep throat.  SEEK IMMEDIATE MEDICAL CARE IF:   · You have a fever.  · You develop severe or persistent headache, ear pain, sinus pain, or chest pain.  · You develop wheezing, a prolonged cough, cough up blood, or have a change in your usual mucus (if you have chronic lung disease).  · You develop sore muscles or a stiff neck.  Document Released: 06/15/2000 Document Revised: 12/09/2010 Document Reviewed: 04/23/2010  ExitCare® Patient Information ©2012 [employer].

## 2011-05-15 NOTE — ED Provider Notes (Signed)
History     [name]: [id number]  Arrival date & time 05/15/11  1300   First MD Initiated Contact with Patient 05/15/11 1325      Chief Complaint  Patient presents with  . Cough    (Consider location/radiation/quality/duration/timing/severity/associated sxs/prior treatment) HPI Patient is a 41 year old female in no history of medical problems who presents today complaining of cough for 4 days. She also complains of nasal congestion and sore throat. Patient had left over amoxicillin at home and has been treating herself with this. She is also here with her 4 year old child who has similar upper respiratory symptoms. Patient denies any fevers. She has no GI or GU symptoms. She does feel that her antibiotics have made her better. Patient does feel she is improving. She reports 4/10 discomfort. There are no other associated or modifying factors. Past Medical History  Diagnosis Date  . TMJ arthralgia     Past Surgical History  Procedure Date  . Tubal ligation   . Shoulder surgery     History reviewed. No pertinent family history.  History  Substance Use Topics  . Smoking status: Never Smoker   . Smokeless tobacco: Not on file  . Alcohol Use: No    OB History    Grav Para Term Preterm Abortions TAB SAB Ect Mult Living                  Review of Systems  Constitutional: Positive for fatigue.  HENT: Positive for congestion and sore throat.   Eyes: Negative.   Respiratory: Positive for cough.   Cardiovascular: Negative.   Gastrointestinal: Negative.   Genitourinary: Negative.   Musculoskeletal: Negative.   Skin: Negative.   Neurological: Negative.   Hematological: Negative.   Psychiatric/Behavioral: Negative.   All other systems reviewed and are negative.    Allergies  Review of patient's allergies indicates no known allergies.  Home Medications   Current Outpatient Rx  Name Route Sig Dispense Refill  . BENZONATATE 100 MG PO CAPS Oral Take 1 capsule (100 mg  total) by mouth every 8 (eight) hours. 21 capsule 0  . HAIR/SKIN/NAILS PO TABS Oral Take 1 tablet by mouth daily.        BP 151/84  Pulse 88  Temp(Src) 98.2 F (36.8 C) (Oral)  Resp 16  SpO2 100%  LMP 05/06/2011  Physical Exam  Nursing note and vitals reviewed. GEN: Well-developed, well-nourished female in no distress HEENT: Atraumatic, normocephalic. Oropharynx with symmetric posterior swelling with occasional exudate noted on tonsils. Swab collected  EYES: PERRLA BL, no scleral icterus. NECK: Trachea midline, no meningismus CV: regular rate and rhythm. No murmurs, rubs, or gallops PULM: No respiratory distress.  No crackles, wheezes, or rales. GI: soft, non-tender. No guarding, rebound, or tenderness. + bowel sounds  GU: deferred Neuro: cranial nerves 2-12 intact, no abnormalities of strength or sensation, A and O x 3 MSK: Patient moves all 4 extremities symmetrically, no deformity, edema, or injury noted Skin: No rashes petechiae, purpura, or jaundice Psych: no abnormality of mood   ED Course  Procedures (including critical care time)   Labs Reviewed  RAPID STREP SCREEN   No results found.   1. Acute URI   2. Cough       MDM  Patient was evaluated by myself. Based on clinical exam swab for was performed. This was negative. Patient is already been self treating at home with amoxicillin. I do think patient's symptoms are likely viral. She does appear to be improving per  her report over the past 4 days. Patient was told that symptoms can last up to 7-10 days. She was given a prescription for [facility] for her cough to aid with sleeping at night. Patient did not require further testing today. She was discharged in good condition.        Cyndra Numbers, MD 05/15/11 [phone]

## 2011-05-15 NOTE — ED Notes (Signed)
Pt c/o cough, congestion since Thurs

## 2012-06-09 DIAGNOSIS — B3731 Acute candidiasis of vulva and vagina: Secondary | ICD-10-CM | POA: Insufficient documentation

## 2012-09-26 DIAGNOSIS — F5102 Adjustment insomnia: Secondary | ICD-10-CM | POA: Insufficient documentation

## 2012-10-23 DIAGNOSIS — D509 Iron deficiency anemia, unspecified: Secondary | ICD-10-CM

## 2013-01-27 ENCOUNTER — Encounter (HOSPITAL_COMMUNITY): Payer: Self-pay | Admitting: Emergency Medicine

## 2013-01-27 ENCOUNTER — Emergency Department (HOSPITAL_COMMUNITY)
Admission: EM | Admit: 2013-01-27 | Discharge: 2013-01-27 | Disposition: A | Discharge status: Home / Self Care | Payer: Medicaid Other | Source: Home / Self Care | Attending: Family Medicine | Admitting: Family Medicine

## 2013-01-27 DIAGNOSIS — J069 Acute upper respiratory infection, unspecified: Secondary | ICD-10-CM

## 2013-01-27 MED ORDER — DEXTROMETHORPHAN POLISTIREX 30 MG/5ML PO LQCR: 30.0000 mg | Freq: Two times a day (BID) | ORAL | Status: DC | PRN

## 2013-01-27 MED ORDER — IPRATROPIUM BROMIDE 0.06 % NA SOLN: 2.0000 | Freq: Four times a day (QID) | NASAL | Status: DC

## 2013-01-27 NOTE — Discharge Instructions (Signed)
Antibiotic Nonuse   Your caregiver felt that the infection or problem was not one that would be helped with an antibiotic.  Infections may be caused by viruses or bacteria. Only a caregiver can tell which one of these is the likely cause of an illness. A cold is the most common cause of infection in both adults and children. A cold is a virus. Antibiotic treatment will have no effect on a viral infection. Viruses can lead to many lost days of work caring for sick children and many missed days of school. Children may catch as many as 10 "colds" or "flus" per year during which they can be tearful, cranky, and uncomfortable. The goal of treating a virus is aimed at keeping the ill person comfortable.  Antibiotics are medications used to help the body fight bacterial infections. There are relatively few types of bacteria that cause infections but there are hundreds of viruses. While both viruses and bacteria cause infection they are very different types of germs. A viral infection will typically go away by itself within 7 to 10 days. Bacterial infections may spread or get worse without antibiotic treatment.  Examples of bacterial infections are:   Sore throats (like strep throat or tonsillitis).   Infection in the lung (pneumonia).   Ear and skin infections.  Examples of viral infections are:   Colds or flus.   Most coughs and bronchitis.   Sore throats not caused by Strep.   Runny noses.  It is often best not to take an antibiotic when a viral infection is the cause of the problem. Antibiotics can kill off the helpful bacteria that we have inside our body and allow harmful bacteria to start growing. Antibiotics can cause side effects such as allergies, nausea, and diarrhea without helping to improve the symptoms of the viral infection. Additionally, repeated uses of antibiotics can cause bacteria inside of our body to become resistant. That resistance can be passed onto harmful bacterial. The next time you have  an infection it may be harder to treat if antibiotics are used when they are not needed. Not treating with antibiotics allows our own immune system to develop and take care of infections more efficiently. Also, antibiotics will work better for us when they are prescribed for bacterial infections.  Treatments for a child that is ill may include:   Give extra fluids throughout the day to stay hydrated.   Get plenty of rest.   Only give your child over-the-counter or prescription medicines for pain, discomfort, or fever as directed by your caregiver.   The use of a cool mist humidifier may help stuffy noses.   Cold medications if suggested by your caregiver.  Your caregiver may decide to start you on an antibiotic if:   The problem you were seen for today continues for a longer length of time than expected.   You develop a secondary bacterial infection.  SEEK MEDICAL CARE IF:   Fever lasts longer than 5 days.   Symptoms continue to get worse after 5 to 7 days or become severe.   Difficulty in breathing develops.   Signs of dehydration develop (poor drinking, rare urinating, dark colored urine).   Changes in behavior or worsening tiredness (listlessness or lethargy).  Document Released: 02/28/2001 Document Revised: 03/14/2011 Document Reviewed: 08/27/2008  ExitCare Patient Information 2014 [employer].

## 2013-01-27 NOTE — ED Provider Notes (Signed)
[name]: [id number]     Arrival date & time 01/27/13  1128 History   First MD Initiated Contact with Patient 01/27/13 1226     Chief Complaint  Patient presents with  . URI   (Consider location/radiation/quality/duration/timing/severity/associated sxs/prior Treatment) HPI Comments: 3 days of cough rhinorrhea, nasal congestion sore throat. Multiple family members ill with same. No fever. Non-smoker. No myalgias, N/V/D.  PCP: [facility], [place]  Patient is a 43 y.o. female presenting with URI. The history is provided by the patient.  URI Presenting symptoms: congestion, cough, rhinorrhea and sore throat   Presenting symptoms: no ear pain   Associated symptoms: no myalgias, no sneezing and no wheezing     Past Medical History  Diagnosis Date  . TMJ arthralgia    Past Surgical History  Procedure Laterality Date  . Tubal ligation    . Shoulder surgery     No family history on file. History  Substance Use Topics  . Smoking status: Never Smoker   . Smokeless tobacco: Not on file  . Alcohol Use: No   OB History   Grav Para Term Preterm Abortions TAB SAB Ect Mult Living                 Review of Systems  Constitutional: Negative.   HENT: Positive for congestion, rhinorrhea and sore throat. Negative for ear pain, mouth sores, sinus pressure, sneezing, tinnitus and voice change.   Eyes: Negative.   Respiratory: Positive for cough. Negative for chest tightness and wheezing.   Cardiovascular: Negative.   Gastrointestinal: Negative.   Musculoskeletal: Negative for myalgias.  Skin: Negative.   Allergic/Immunologic: Negative for immunocompromised state.  Neurological: Negative.     Allergies  Review of patient's allergies indicates no known allergies.  Home Medications   Current Outpatient Rx  Name  Route  Sig  Dispense  Refill  . Multiple Vitamins-Minerals (HAIR/SKIN/NAILS) TABS   Oral   Take 1 tablet by mouth daily.            BP 162/84  Pulse 78   Temp(Src) 98.2 F (36.8 C) (Oral)  Resp 24  SpO2 97%  LMP 12/16/2012 Physical Exam  Nursing note and vitals reviewed. Constitutional: She is oriented to person, place, and time. She appears well-developed and well-nourished. No distress.  HENT:  Head: Normocephalic and atraumatic.  Right Ear: Hearing, tympanic membrane, external ear and ear canal normal.  Left Ear: Hearing, tympanic membrane, external ear and ear canal normal.  Nose: Nose normal. Right sinus exhibits no maxillary sinus tenderness and no frontal sinus tenderness. Left sinus exhibits no maxillary sinus tenderness and no frontal sinus tenderness.  Mouth/Throat: Uvula is midline, oropharynx is clear and moist and mucous membranes are normal.  Eyes: Conjunctivae are normal. Right eye exhibits no discharge. Left eye exhibits no discharge. No scleral icterus.  Neck: Normal range of motion. Neck supple. No thyromegaly present.  Cardiovascular: Normal rate, regular rhythm and normal heart sounds.   Pulmonary/Chest: Effort normal and breath sounds normal. No respiratory distress. She has no wheezes.  Abdominal: Soft. Bowel sounds are normal. She exhibits no distension. There is no tenderness.  Musculoskeletal: Normal range of motion.  Lymphadenopathy:    She has no cervical adenopathy.  Neurological: She is alert and oriented to person, place, and time.  Skin: Skin is warm and dry.  Psychiatric: She has a normal mood and affect. Her behavior is normal.    ED Course  Procedures (including critical care time) Labs  Review Labs Reviewed - No data to display Imaging Review No results found.  EKG Interpretation    Date/Time:    Ventricular Rate:    PR Interval:    QRS Duration:   QT Interval:    QTC Calculation:   R Axis:     Text Interpretation:              MDM  Exam consistent with URI. Will treat with atrovent nasal spray and delsym for cough. Advise PCP follow up if no improvement in 7-10 days or fever.      [place], [place] 01/27/13 1327

## 2013-01-27 NOTE — ED Notes (Signed)
States whole family has been ill with URIs and finally were placed on abx.  C/O productive cough with SOB since 1/22.  Unsure if fevers.  Has tried several OTC meds without relief.  Faint expiratory wheeze and ronchi noted on right side.

## 2013-01-27 NOTE — ED Notes (Signed)
Comfort measures offered.  Updated on wait.

## 2013-01-28 NOTE — ED Provider Notes (Signed)
Medical screening examination/treatment/procedure(s) were performed by resident physician or non-physician practitioner and as supervising physician I was immediately available for consultation/collaboration.   Pauline Good MD.   Billy Fischer, MD 01/28/13 [phone]

## 2013-01-30 ENCOUNTER — Emergency Department (HOSPITAL_BASED_OUTPATIENT_CLINIC_OR_DEPARTMENT_OTHER)
Admission: EM | Admit: 2013-01-30 | Discharge: 2013-01-30 | Disposition: A | Discharge status: Home / Self Care | Payer: Medicaid Other | Attending: Emergency Medicine | Admitting: Emergency Medicine

## 2013-01-30 ENCOUNTER — Emergency Department (HOSPITAL_BASED_OUTPATIENT_CLINIC_OR_DEPARTMENT_OTHER): Payer: Medicaid Other

## 2013-01-30 ENCOUNTER — Encounter (HOSPITAL_BASED_OUTPATIENT_CLINIC_OR_DEPARTMENT_OTHER): Payer: Self-pay | Admitting: Emergency Medicine

## 2013-01-30 DIAGNOSIS — Z8719 Personal history of other diseases of the digestive system: Secondary | ICD-10-CM | POA: Insufficient documentation

## 2013-01-30 DIAGNOSIS — Z79899 Other long term (current) drug therapy: Secondary | ICD-10-CM | POA: Insufficient documentation

## 2013-01-30 DIAGNOSIS — J40 Bronchitis, not specified as acute or chronic: Secondary | ICD-10-CM | POA: Insufficient documentation

## 2013-01-30 MED ORDER — ALBUTEROL SULFATE HFA 108 (90 BASE) MCG/ACT IN AERS
2.0000 | INHALATION_SPRAY | RESPIRATORY_TRACT | Status: DC | PRN
  Administered 2013-01-30: 2 via RESPIRATORY_TRACT
  Filled 2013-01-30: qty 6.7

## 2013-01-30 NOTE — ED Provider Notes (Signed)
[name]: [id number]     Arrival date & time 01/30/13  1119 History   First MD Initiated Contact with Patient 01/30/13 1142     Chief Complaint  Patient presents with  . URI   (Consider location/radiation/quality/duration/timing/severity/associated sxs/prior Treatment) Patient is a 43 y.o. female presenting with URI.  URI  Pt with no significant PMH reports 2-3 weeks of URI symptoms, initially severe but has improved some however she continues to have nagging cough. Seen at [facility] earlier this week for same, given Atrovent nasal spray and delsym which has helped some but other people in the household have been given Abx and she is concerned about Pneumonia. No fever, no CP no SOB. Symptoms are worse at night.   Past Medical History  Diagnosis Date  . TMJ arthralgia    Past Surgical History  Procedure Laterality Date  . Tubal ligation    . Shoulder surgery     No family history on file. History  Substance Use Topics  . Smoking status: Never Smoker   . Smokeless tobacco: Not on file  . Alcohol Use: No   OB History   Grav Para Term Preterm Abortions TAB SAB Ect Mult Living                 Review of Systems All other systems reviewed and are negative except as noted in HPI.   Allergies  Review of patient's allergies indicates no known allergies.  Home Medications   Current Outpatient Rx  Name  Route  Sig  Dispense  Refill  . dextromethorphan (DELSYM) 30 MG/5ML liquid   Oral   Take 5 mLs (30 mg total) by mouth 2 (two) times daily as needed for cough.   148 mL   0   . ipratropium (ATROVENT) 0.06 % nasal spray   Each Nare   Place 2 sprays into both nostrils 4 (four) times daily.   15 mL   0   . Multiple Vitamins-Minerals (HAIR/SKIN/NAILS) TABS   Oral   Take 1 tablet by mouth daily.            BP 150/86  Pulse 81  Temp(Src) 97.9 F (36.6 C) (Oral)  Resp 18  Ht 5' 6.5" (1.689 m)  Wt 309 lb (140.161 kg)  BMI 49.13 kg/m2  SpO2 100%  LMP 01/16/2013 Physical  Exam  Nursing note and vitals reviewed. Constitutional: She is oriented to person, place, and time. She appears well-developed and well-nourished.  HENT:  Head: Normocephalic and atraumatic.  Eyes: EOM are normal. Pupils are equal, round, and reactive to light.  Neck: Normal range of motion. Neck supple.  Cardiovascular: Normal rate, normal heart sounds and intact distal pulses.   Pulmonary/Chest: Effort normal and breath sounds normal. No respiratory distress. She has no wheezes. She has no rales.  Abdominal: Bowel sounds are normal. She exhibits no distension. There is no tenderness.  Musculoskeletal: Normal range of motion. She exhibits no edema and no tenderness.  Neurological: She is alert and oriented to person, place, and time. She has normal strength. No cranial nerve deficit or sensory deficit.  Skin: Skin is warm and dry. No rash noted.  Psychiatric: She has a normal mood and affect.    ED Course  Procedures (including critical care time) Labs Review Labs Reviewed - No data to display Imaging Review Dg Chest 2 View  01/30/2013   CLINICAL DATA:  Cough and congestion  EXAM: CHEST  2 VIEW  COMPARISON:  09/20/2012  FINDINGS: The  heart size and mediastinal contours are within normal limits. Both lungs are clear. The visualized skeletal structures are unremarkable.  IMPRESSION: No active cardiopulmonary disease.   Electronically Signed   By: Franchot Gallo M.D.   On: 01/30/2013 11:47    EKG Interpretation   None       MDM   1. Bronchitis     CXR neg. No PNA. Given Albuterol HFA, advised to continue symptomatic care at home. No indication for Abx, likely viral bronchitis with expected persistent cough.    Charles B. Karle Starch, MD 01/30/13 1208

## 2013-01-30 NOTE — Discharge Instructions (Signed)

## 2013-01-30 NOTE — ED Notes (Signed)
C/o cough, sob x 2 weeks-was seen at urgent care 3 days ago-dx with URI

## 2013-02-14 ENCOUNTER — Emergency Department (HOSPITAL_BASED_OUTPATIENT_CLINIC_OR_DEPARTMENT_OTHER)
Admission: EM | Admit: 2013-02-14 | Discharge: 2013-02-15 | Disposition: A | Discharge status: Home / Self Care | Payer: Medicaid Other | Attending: Emergency Medicine | Admitting: Emergency Medicine

## 2013-02-14 ENCOUNTER — Encounter (HOSPITAL_BASED_OUTPATIENT_CLINIC_OR_DEPARTMENT_OTHER): Payer: Self-pay | Admitting: Emergency Medicine

## 2013-02-14 DIAGNOSIS — L02412 Cutaneous abscess of left axilla: Secondary | ICD-10-CM

## 2013-02-14 DIAGNOSIS — Z8719 Personal history of other diseases of the digestive system: Secondary | ICD-10-CM | POA: Insufficient documentation

## 2013-02-14 DIAGNOSIS — IMO0002 Reserved for concepts with insufficient information to code with codable children: Secondary | ICD-10-CM | POA: Insufficient documentation

## 2013-02-14 NOTE — ED Notes (Signed)
Abscess under her left axilla.

## 2013-02-15 MED ORDER — HYDROCODONE-ACETAMINOPHEN 5-325 MG PO TABS: 1.0000 | ORAL_TABLET | Freq: Four times a day (QID) | ORAL | Status: DC | PRN

## 2013-02-15 NOTE — Discharge Instructions (Signed)
Abscess An abscess is an infected area that contains a collection of pus and debris.It can occur in almost any part of the body. An abscess is also known as a furuncle or boil. CAUSES  An abscess occurs when tissue gets infected. This can occur from blockage of oil or sweat glands, infection of hair follicles, or a minor injury to the skin. As the body tries to fight the infection, pus collects in the area and creates pressure under the skin. This pressure causes pain. People with weakened immune systems have difficulty fighting infections and get certain abscesses more often.  SYMPTOMS Usually an abscess develops on the skin and becomes a painful mass that is red, warm, and tender. If the abscess forms under the skin, you may feel a moveable soft area under the skin. Some abscesses break open (rupture) on their own, but most will continue to get worse without care. The infection can spread deeper into the body and eventually into the bloodstream, causing you to feel ill.  DIAGNOSIS  Your caregiver will take your medical history and perform a physical exam. A sample of fluid may also be taken from the abscess to determine what is causing your infection. TREATMENT  Your caregiver may prescribe antibiotic medicines to fight the infection. However, taking antibiotics alone usually does not cure an abscess. Your caregiver may need to make a small cut (incision) in the abscess to drain the pus. In some cases, gauze is packed into the abscess to reduce pain and to continue draining the area. HOME CARE INSTRUCTIONS   Only take over-the-counter or prescription medicines for pain, discomfort, or fever as directed by your caregiver.  If you were prescribed antibiotics, take them as directed. Finish them even if you start to feel better.  If gauze is used, follow your caregiver's directions for changing the gauze.  To avoid spreading the infection:  Keep your draining abscess covered with a  bandage.  Wash your hands well.  Do not share personal care items, towels, or whirlpools with others.  Avoid skin contact with others.  Keep your skin and clothes clean around the abscess.  Keep all follow-up appointments as directed by your caregiver. SEEK MEDICAL CARE IF:   You have increased pain, swelling, redness, fluid drainage, or bleeding.  You have muscle aches, chills, or a general ill feeling.  You have a fever. MAKE SURE YOU:   Understand these instructions.  Will watch your condition.  Will get help right away if you are not doing well or get worse. Document Released: 09/29/2004 Document Revised: 06/21/2011 Document Reviewed: 03/04/2011 [employer] Patient Information 2014 [employer].  

## 2013-02-15 NOTE — ED Provider Notes (Signed)
[name]: [id number]     Arrival date & time 02/14/13  2155 History   First MD Initiated Contact with Patient 02/15/13 0058     Chief Complaint  Patient presents with  . Abscess     (Consider location/radiation/quality/duration/timing/severity/associated sxs/prior Treatment) HPI This is a 43 year old female with a four-day history of a painful tender lump under her left axilla. She has had some drainage which was initially white and has become purulent. There is moderate to severe tenderness associated with the pain which radiates to her left shoulder area. She has had no fever or chills. She has no history of abscesses in the past.  Past Medical History  Diagnosis Date  . TMJ arthralgia    Past Surgical History  Procedure Laterality Date  . Tubal ligation    . Shoulder surgery     No family history on file. History  Substance Use Topics  . Smoking status: Never Smoker   . Smokeless tobacco: Not on file  . Alcohol Use: No   OB History   Grav Para Term Preterm Abortions TAB SAB Ect Mult Living                 Review of Systems  All other systems reviewed and are negative.      Allergies  Review of patient's allergies indicates no known allergies.  Home Medications   Current Outpatient Rx  Name  Route  Sig  Dispense  Refill  . dextromethorphan (DELSYM) 30 MG/5ML liquid   Oral   Take 5 mLs (30 mg total) by mouth 2 (two) times daily as needed for cough.   148 mL   0   . ipratropium (ATROVENT) 0.06 % nasal spray   Each Nare   Place 2 sprays into both nostrils 4 (four) times daily.   15 mL   0   . Multiple Vitamins-Minerals (HAIR/SKIN/NAILS) TABS   Oral   Take 1 tablet by mouth daily.            BP 149/88  Pulse 80  Temp(Src) 98.3 F (36.8 C) (Oral)  Resp 18  Ht 5' 6.5" (1.689 m)  Wt 309 lb (140.161 kg)  BMI 49.13 kg/m2  SpO2 100%  LMP 01/16/2013  Physical Exam General: Well-developed, well-nourished female in no acute distress; appearance  consistent with age of record HENT: normocephalic; atraumatic Eyes: pupils equal, round and reactive to light; extraocular muscles intact Neck: supple Heart: regular rate and rhythm Lungs: clear to auscultation bilaterally Abdomen: soft; nondistended Extremities: No deformity; full range of motion Neurologic: Awake, alert and oriented; motor function intact in all extremities and symmetric; no facial droop Skin: Warm and dry; abscess of left axilla without surrounding cellulitis Psychiatric: Normal mood and affect    ED Course  Procedures (including critical care time)  INCISION AND DRAINAGE Performed by: Shanon Rosser L Consent: Verbal consent obtained. Risks and benefits: risks, benefits and alternatives were discussed Type: abscess  Body area: Left axilla  Anesthesia: local infiltration  Incision was made with a scalpel.  Local anesthetic: lidocaine 2 % with epinephrine  Anesthetic total: 3 ml  Complexity: complex Blunt dissection to break up loculations  Drainage: purulent  Drainage amount: Copious   Packing material: Sterile gauze pledget   Patient tolerance: Patient tolerated the procedure well with no immediate complications.     MDM  Patient advised to remove gauze pledget and 2-3 days.   Wynetta Fines, MD 02/15/13 [phone]

## 2013-06-13 ENCOUNTER — Emergency Department (HOSPITAL_COMMUNITY)
Admission: EM | Admit: 2013-06-13 | Discharge: 2013-06-13 | Disposition: A | Discharge status: Home / Self Care | Payer: Medicaid Other | Source: Home / Self Care

## 2013-06-13 ENCOUNTER — Other Ambulatory Visit (HOSPITAL_COMMUNITY)
Admission: RE | Admit: 2013-06-13 | Discharge: 2013-06-13 | Disposition: A | Discharge status: Home / Self Care | Payer: Medicaid Other | Source: Ambulatory Visit | Attending: Family Medicine | Admitting: Family Medicine

## 2013-06-13 ENCOUNTER — Encounter (HOSPITAL_COMMUNITY): Payer: Self-pay | Admitting: Emergency Medicine

## 2013-06-13 DIAGNOSIS — Z113 Encounter for screening for infections with a predominantly sexual mode of transmission: Secondary | ICD-10-CM | POA: Insufficient documentation

## 2013-06-13 DIAGNOSIS — R350 Frequency of micturition: Secondary | ICD-10-CM

## 2013-06-13 DIAGNOSIS — N760 Acute vaginitis: Secondary | ICD-10-CM | POA: Insufficient documentation

## 2013-06-13 DIAGNOSIS — N898 Other specified noninflammatory disorders of vagina: Secondary | ICD-10-CM

## 2013-06-13 LAB — POCT URINALYSIS DIP (DEVICE)
BILIRUBIN URINE: NEGATIVE
GLUCOSE, UA: NEGATIVE mg/dL
HGB URINE DIPSTICK: NEGATIVE
Ketones, ur: NEGATIVE mg/dL
Nitrite: NEGATIVE
PH: 6 (ref 5.0–8.0)
Protein, ur: NEGATIVE mg/dL
Urobilinogen, UA: 0.2 mg/dL (ref 0.0–1.0)

## 2013-06-13 LAB — POCT PREGNANCY, URINE: Preg Test, Ur: NEGATIVE

## 2013-06-13 NOTE — Discharge Instructions (Signed)
Urinary Frequency The number of times a normal person urinates depends upon how much liquid they take in and how much liquid they are losing. If the temperature is hot and there is high humidity then the person will sweat more and usually breathe a little more frequently. These factors decrease the amount of frequency of urination that would be considered normal. The amount you drink is easily determined, but the amount of fluid lost is sometimes more difficult to calculate.  Fluid is lost in two ways:  Sensible fluid loss is usually measured by the amount of urine that you get rid of. Losses of fluid can also occur with diarrhea.  Insensible fluid loss is more difficult to measure. It is caused by evaporation. Insensible loss of fluid occurs through breathing and sweating. It usually ranges from a little less than a quart to a little more than a quart of fluid a day. In normal temperatures and activity levels the average person may urinate 4 to 7 times in a 24-hour period. Needing to urinate more often than that could indicate a problem. If one urinates 4 to 7 times in 24 hours and has large volumes each time, that could indicate a different problem from one who urinates 4 to 7 times a day and has small volumes. The time of urinating is also an important. Most urinating should be done during the waking hours. Getting up at night to urinate frequently can indicate some problems. CAUSES  The bladder is the organ in your lower abdomen that holds urine. Like a balloon, it swells some as it fills up. Your nerves sense this and tell you it is time to head for the bathroom. There are a number of reasons that you might feel the need to urinate more often than usual. They include:  Urinary tract infection. This is usually associated with other signs such as burning when you urinate.  In men, problems with the prostate (a walnut-size gland that is located near the tube that carries urine out of your body).  There are two reasons why the prostate can cause an increased frequency of urination:  An enlarged prostate that does not let the bladder empty well. If the bladder only half empties when you urinate then it only has half the capacity to fill before you have to urinate again.  The nerves in the bladder become more hypersensitive with an increased size of the prostate even if the bladder empties completely.  Pregnancy.  Obesity. Excess weight is more likely to cause a problem for women more than for men.  Bladder stones or other bladder problems.  Caffeine.  Alcohol.  Medications. For example, drugs that help the body get rid of extra fluid (diuretics) increase urine production. Some other medicines must be taken with lots of fluids.  Muscle or nerve weakness. This might be the result of a spinal cord injury, a stroke, multiple sclerosis or Parkinson's disease.  Long-standing diabetes can decrease the sensation of the bladder. This loss of sensation makes it harder to sense the bladder needs to be emptied. Over a period of years the bladder is stretched out by constant overfilling. This weakens the bladder muscles so that the bladder does not empty well and has less capacity to fill with new urine.  Interstitial cystitis (also called painful bladder syndrome). This condition develops because the tissues that line the insider of the bladder are inflamed (inflammation is the body's way of reacting to injury or infection). It causes pain  and frequent urination. It occurs in women more often than in men. DIAGNOSIS   To decide what might be causing your urinary frequency, your healthcare provider will probably:  Ask about symptoms you have noticed.  Ask about your overall health. This will include questions about any medications you are taking.  Do a physical examination.  Order some tests. These might include:  A blood test to check for diabetes or other health issues that could be  contributing to the problem.  Urine testing. This could measure the flow of urine and the pressure on the bladder.  A test of your neurological system (the brain, spinal cord and nerves). This is the system that senses the need to urinate.  A bladder test to check whether it is emptying completely when you urinate.  Cytoscopy. This test uses a thin tube with a tiny camera on it. It offers a look inside your urethra and bladder to see if there are problems.  Imaging tests. You might be given a contrast dye and then asked to urinate. X-rays are taken to see how your bladder is working. TREATMENT  It is important for you to be evaluated to determine if the amount or frequency that you have is unusual or abnormal. If it is found to be abnormal the cause should be determined and this can usually be found out easily. Depending upon the cause treatment could include medication, stimulation of the nerves, or surgery. There are not too many things that you can do as an individual to change your urinary frequency. It is important that you balance the amount of fluid intake needed to compensate for your activity and the temperature. Medical problems will be diagnosed and taken care of by your physician. There is no particular bladder training such as Kegel's exercises that you can do to help urinary frequency. This is an exercise this is usually done for people who have leaking of urine when they laugh cough or sneeze. HOME CARE INSTRUCTIONS   Take any medications your healthcare provider prescribed or suggested. Follow the directions carefully.  Practice any lifestyle changes that are recommended. These might include:  Drinking less fluid or drinking at different times of the day. If you need to urinate often during the night, for example, you may need to stop drinking fluids early in the evening.  Cutting down on caffeine or alcohol. They both can make you need to urinate more often than normal.  Caffeine is found in coffee, tea and sodas.  Losing weight, if that is recommended.  Keep a journal or a log. You might be asked to record how much you drink and when and when you feel the need to urinate. This will also help evaluate how well the treatment provided by your physician is working. SEEK MEDICAL CARE IF:   Your need to urinate often gets worse.  You feel increased pain or irritation when you urinate.  You notice blood in your urine.  You have questions about any medications that your healthcare provider recommended.  You notice blood, pus or swelling at the site of any test or treatment procedure.  You develop a fever of more than 100.5 F (38.1 C). SEEK IMMEDIATE MEDICAL CARE IF:  You develop a fever of more than 102.0 F (38.9 C). Document Released: 10/16/2008 Document Revised: 03/14/2011 Document Reviewed: 10/16/2008 [facility] Patient Information 2014 [place].

## 2013-06-13 NOTE — ED Notes (Signed)
Pt  Reports   Symptoms  Of  A  Urinary  Tract  Infection    As  Well  As   Some  Cold  Symptoms        X  4  Days       History  Of  Asthma           Pt  Reports    Vaginal   Discharge          No  Discoloration

## 2013-06-13 NOTE — ED Provider Notes (Signed)
[name]: [id number]     Arrival date & time 06/13/13  1411 History   First MD Initiated Contact with Patient 06/13/13 1443     Chief Complaint  Patient presents with  . Urinary Frequency   (Consider location/radiation/quality/duration/timing/severity/associated sxs/prior Treatment) HPI  Past Medical History  Diagnosis Date  . TMJ arthralgia    Past Surgical History  Procedure Laterality Date  . Tubal ligation    . Shoulder surgery     No family history on file. History  Substance Use Topics  . Smoking status: Never Smoker   . Smokeless tobacco: Not on file  . Alcohol Use: No   OB History   Grav Para Term Preterm Abortions TAB SAB Ect Mult Living                 Review of Systems  Allergies  Review of patient's allergies indicates no known allergies.  Home Medications   Prior to Admission medications   Medication Sig Start Date End Date Taking? Authorizing Provider  dextromethorphan (DELSYM) 30 MG/5ML liquid Take 5 mLs (30 mg total) by mouth 2 (two) times daily as needed for cough. 01/27/13   Lahoma Rocker, PA  HYDROcodone-acetaminophen (NORCO/VICODIN) 5-325 MG per tablet Take 1-2 tablets by mouth every 6 (six) hours as needed for moderate pain. 02/15/13   John L Molpus, MD  ipratropium (ATROVENT) 0.06 % nasal spray Place 2 sprays into both nostrils 4 (four) times daily. 01/27/13   Lahoma Rocker, PA  Multiple Vitamins-Minerals (HAIR/SKIN/NAILS) TABS Take 1 tablet by mouth daily.      Historical Provider, MD   BP 162/75  Pulse 97  Temp(Src) 98.5 F (36.9 C) (Oral)  Resp 16  SpO2 100%  LMP 06/01/2013 Physical Exam  ED Course  Procedures (including critical care time) Labs Review Labs Reviewed  POCT URINALYSIS DIP (DEVICE) - Abnormal; Notable for the following:    Leukocytes, UA SMALL (*)    All other components within normal limits  URINE CULTURE  POCT PREGNANCY, URINE    Imaging Review No results found. Results for orders placed during the  hospital encounter of 06/13/13  POCT URINALYSIS DIP (DEVICE)      Result Value Ref Range   Glucose, UA NEGATIVE  NEGATIVE mg/dL   Bilirubin Urine NEGATIVE  NEGATIVE   Ketones, ur NEGATIVE  NEGATIVE mg/dL   Specific Gravity, Urine >=1.030  1.005 - 1.030   Hgb urine dipstick NEGATIVE  NEGATIVE   pH 6.0  5.0 - 8.0   Protein, ur NEGATIVE  NEGATIVE mg/dL   Urobilinogen, UA 0.2  0.0 - 1.0 mg/dL   Nitrite NEGATIVE  NEGATIVE   Leukocytes, UA SMALL (*) NEGATIVE  POCT PREGNANCY, URINE      Result Value Ref Range   Preg Test, Ur NEGATIVE  NEGATIVE     MDM   1. Urinary frequency   2. Vaginal discharge     See your GYN as your request Pending urine culture If worse may return. Pt declined further tx or exam. After discharge papers written pt is concerned about STD . Will agree to urine ancillary.    Janne Napoleon, NP 06/13/13 1517

## 2013-06-14 LAB — URINE CULTURE

## 2013-06-19 NOTE — ED Provider Notes (Signed)
Medical screening examination/treatment/procedure(s) were performed by a resident physician or non-physician practitioner and as the supervising physician I was immediately available for consultation/collaboration.  Lynne Leader, MD   Gregor Hams, MD 06/19/13 [phone]

## 2013-06-24 ENCOUNTER — Telehealth (HOSPITAL_COMMUNITY): Payer: Self-pay

## 2013-06-24 NOTE — ED Notes (Signed)
Patient requesting lab result.  DOB and address verified.  Patient was given results that were all negative.

## 2013-08-01 ENCOUNTER — Ambulatory Visit: Payer: Medicaid Other | Admitting: Obstetrics

## 2013-09-05 ENCOUNTER — Encounter (HOSPITAL_COMMUNITY): Payer: Self-pay | Admitting: Emergency Medicine

## 2013-09-05 ENCOUNTER — Emergency Department (HOSPITAL_COMMUNITY)
Admission: EM | Admit: 2013-09-05 | Discharge: 2013-09-05 | Disposition: A | Discharge status: Home / Self Care | Payer: Medicaid Other | Source: Home / Self Care | Attending: Family Medicine | Admitting: Family Medicine

## 2013-09-05 DIAGNOSIS — J40 Bronchitis, not specified as acute or chronic: Secondary | ICD-10-CM

## 2013-09-05 HISTORY — DX: Unspecified asthma, uncomplicated: J45.909

## 2013-09-05 MED ORDER — PHENYLEPH-PROMETHAZINE-COD 5-6.25-10 MG/5ML PO SYRP: 5.0000 mL | ORAL_SOLUTION | ORAL | Status: DC | PRN

## 2013-09-05 MED ORDER — ALBUTEROL SULFATE HFA 108 (90 BASE) MCG/ACT IN AERS
2.0000 | INHALATION_SPRAY | RESPIRATORY_TRACT | Status: DC | PRN
  Administered 2013-09-05: 2 via RESPIRATORY_TRACT

## 2013-09-05 MED ORDER — AZITHROMYCIN 250 MG PO TABS: 250.0000 mg | ORAL_TABLET | Freq: Every day | ORAL | Status: DC

## 2013-09-05 MED ORDER — PREDNISONE 20 MG PO TABS
ORAL_TABLET | ORAL | Status: AC
  Filled 2013-09-05: qty 3

## 2013-09-05 MED ORDER — PREDNISONE 20 MG PO TABS
60.0000 mg | ORAL_TABLET | Freq: Once | ORAL | Status: AC
  Administered 2013-09-05: 60 mg via ORAL

## 2013-09-05 MED ORDER — ALBUTEROL SULFATE HFA 108 (90 BASE) MCG/ACT IN AERS
INHALATION_SPRAY | RESPIRATORY_TRACT | Status: AC
  Filled 2013-09-05: qty 6.7

## 2013-09-05 MED ORDER — PREDNISONE 10 MG PO TABS: ORAL_TABLET | ORAL | Status: DC

## 2013-09-05 NOTE — ED Provider Notes (Signed)
[name]: [id number]     Arrival date & time 09/05/13  30 History   First MD Initiated Contact with Patient 09/05/13 1836     Chief Complaint  Patient presents with  . Cough   (Consider location/radiation/quality/duration/timing/severity/associated sxs/prior Treatment) Patient is a 43 y.o. female presenting with cough. The history is provided by the patient.  Cough Cough characteristics:  Non-productive Onset quality:  Gradual Duration:  3 days Timing:  Sporadic Progression:  Worsening Chronicity:  New Smoker: no   Relieved by:  Nothing Worsened by:  Activity and lying down Ineffective treatments:  Beta-agonist inhaler Associated symptoms: chills   Associated symptoms: no chest pain and no rash  Fever: low grade. Shortness of breath: with cough.    Jaime Castaneda is a 43 y.o. female who presents to the [facility] with cough and feeling short of breath for the past few days. She states that she is living in housing where there is mole and she has had problems off and on is trying to find another place. She reports low grade fever and worsening symptoms the past 2 days. She is working and going to school and states she needs medication to help her get better. She has used other family members inhaler and nebulizer for some relief. She is scheduled to start care with a PCP 09/13/13 but felt like she needed something before then.  Past Medical History  Diagnosis Date  . TMJ arthralgia   . Asthma    Past Surgical History  Procedure Laterality Date  . Tubal ligation    . Shoulder surgery     History reviewed. No pertinent family history. History  Substance Use Topics  . Smoking status: Never Smoker   . Smokeless tobacco: Not on file  . Alcohol Use: No   OB History   Grav Para Term Preterm Abortions TAB SAB Ect Mult Living                 Review of Systems  Constitutional: Positive for chills. Fever: low grade.  HENT: Positive for congestion.   Respiratory: Positive for cough.  Shortness of breath: with cough.   Cardiovascular: Negative for chest pain and palpitations.  Gastrointestinal: Negative for nausea, vomiting and abdominal pain.  Skin: Negative for rash.  Psychiatric/Behavioral: The patient is not nervous/anxious.     Allergies  Review of patient's allergies indicates no known allergies.  Home Medications   Prior to Admission medications   Medication Sig Start Date End Date Taking? Authorizing Provider  ALBUTEROL IN Inhale into the lungs.   Yes Historical Provider, MD  dextromethorphan (DELSYM) 30 MG/5ML liquid Take 5 mLs (30 mg total) by mouth 2 (two) times daily as needed for cough. 01/27/13   Lutricia Feil, PA  HYDROcodone-acetaminophen (NORCO/VICODIN) 5-325 MG per tablet Take 1-2 tablets by mouth every 6 (six) hours as needed for moderate pain. 02/15/13   John L Molpus, MD  ipratropium (ATROVENT) 0.06 % nasal spray Place 2 sprays into both nostrils 4 (four) times daily. 01/27/13   Lutricia Feil, PA  Multiple Vitamins-Minerals (HAIR/SKIN/NAILS) TABS Take 1 tablet by mouth daily.      Historical Provider, MD   BP 143/84  Pulse 84  Temp(Src) 99.9 F (37.7 C) (Oral)  Resp 20  SpO2 100%  LMP 09/03/2013 Physical Exam  Nursing note and vitals reviewed. Constitutional: She is oriented to person, place, and time. No distress.  obese  HENT:  Head: Normocephalic.  Eyes: Conjunctivae and EOM are  normal. Pupils are equal, round, and reactive to light.  Neck: Normal range of motion. Neck supple.  Cardiovascular: Normal rate and regular rhythm.   Pulmonary/Chest: Effort normal. She has wheezes. She has no rales.  Musculoskeletal: Normal range of motion.  Neurological: She is alert and oriented to person, place, and time. No cranial nerve deficit.  Skin: Skin is warm and dry.  Psychiatric: She has a normal mood and affect. Her behavior is normal.    ED Course  Procedures Albuterol 2 puffs and instructions on use, Prednisone 60 mg PO  prior to d/c.  MDM  43 y.o. female with cough and congestion. Stable for discharge without respiratory distress. O2 SAT 100% on R/A. Discussed with the patient clinical findings and plan of care. All questioned fully answered. She will return if any problems arise.    Medication List    TAKE these medications       azithromycin 250 MG tablet  Commonly known as:  ZITHROMAX  Take 1 tablet (250 mg total) by mouth daily. Take first 2 tablets together, then 1 every day until finished.     Phenyleph-Promethazine-Cod 5-6.25-10 MG/5ML Syrp  Take 5 mLs by mouth every 4 (four) hours as needed.     predniSONE 10 MG tablet  Commonly known as:  DELTASONE  Starting tomorrow 09/06/13, take 20 mg PO BID      ASK your doctor about these medications       ALBUTEROL IN  Inhale into the lungs.     dextromethorphan 30 MG/5ML liquid  Commonly known as:  DELSYM  Take 5 mLs (30 mg total) by mouth 2 (two) times daily as needed for cough.     HAIR/SKIN/NAILS Tabs  Take 1 tablet by mouth daily.     HYDROcodone-acetaminophen 5-325 MG per tablet  Commonly known as:  NORCO/VICODIN  Take 1-2 tablets by mouth every 6 (six) hours as needed for moderate pain.     ipratropium 0.06 % nasal spray  Commonly known as:  ATROVENT  Place 2 sprays into both nostrils 4 (four) times daily.          [place], [place] 09/05/13 1905

## 2013-09-05 NOTE — ED Notes (Signed)
C/o a persistant nonproductive cough x 3 days.  No relief with albuterol inhaler or nebulizer.  States "feels like i'm not getting enough air".  Denies fever,n/v/d.  States upcoming appt on Friday with [facility].

## 2013-09-06 MED ORDER — TRAMADOL HCL 50 MG PO TABS: ORAL_TABLET | ORAL | Status: DC

## 2013-09-06 NOTE — ED Provider Notes (Signed)
Medical screening examination/treatment/procedure(s) were performed by a resident physician or non-physician practitioner and as the supervising physician I was immediately available for consultation/collaboration.  Lynne Leader, MD   Gregor Hams, MD 09/06/13 [phone]

## 2013-12-05 ENCOUNTER — Encounter (HOSPITAL_COMMUNITY): Payer: Self-pay | Admitting: Family Medicine

## 2013-12-05 ENCOUNTER — Emergency Department (HOSPITAL_COMMUNITY)
Admission: EM | Admit: 2013-12-05 | Discharge: 2013-12-05 | Disposition: A | Discharge status: Home / Self Care | Payer: Medicaid Other | Source: Home / Self Care | Attending: Family Medicine | Admitting: Family Medicine

## 2013-12-05 DIAGNOSIS — B3731 Acute candidiasis of vulva and vagina: Secondary | ICD-10-CM

## 2013-12-05 DIAGNOSIS — B373 Candidiasis of vulva and vagina: Secondary | ICD-10-CM

## 2013-12-05 MED ORDER — FLUCONAZOLE 150 MG PO TABS: 150.0000 mg | ORAL_TABLET | Freq: Every day | ORAL | Status: DC

## 2013-12-05 NOTE — ED Provider Notes (Signed)
[name]: [id number]     Arrival date & time 12/05/13  1321 History   First MD Initiated Contact with Patient 12/05/13 1341     Chief Complaint  Patient presents with  . Vaginitis   (Consider location/radiation/quality/duration/timing/severity/associated sxs/prior Treatment) HPI  4 days ago developed vaginal irritation and white thick discharge. Not sexually active. Denies burning, foul odor, abd pain, dysuria, frequency. Denies recent ABX use. Monostat w/o benefit.   HTN: no previous elevated readings. Not on medications.   Past Medical History  Diagnosis Date  . TMJ arthralgia   . Asthma    Past Surgical History  Procedure Laterality Date  . Tubal ligation    . Shoulder surgery     No family history on file. History  Substance Use Topics  . Smoking status: Never Smoker   . Smokeless tobacco: Not on file  . Alcohol Use: No   OB History    No data available     Review of Systems Per HPI with all other pertinent systems negative.   Allergies  Review of patient's allergies indicates no known allergies.  Home Medications   Prior to Admission medications   Medication Sig Start Date End Date Taking? Authorizing Provider  ALBUTEROL IN Inhale into the lungs.    Historical Provider, MD  dextromethorphan (DELSYM) 30 MG/5ML liquid Take 5 mLs (30 mg total) by mouth 2 (two) times daily as needed for cough. 01/27/13   Audelia Hives Presson, PA  fluconazole (DIFLUCAN) 150 MG tablet Take 1 tablet (150 mg total) by mouth daily. Repeat dose in 3 days 12/05/13   Waldemar Dickens, MD  ipratropium (ATROVENT) 0.06 % nasal spray Place 2 sprays into both nostrils 4 (four) times daily. 01/27/13   Lutricia Feil, PA  Multiple Vitamins-Minerals (HAIR/SKIN/NAILS) TABS Take 1 tablet by mouth daily.      Historical Provider, MD  Phenyleph-Promethazine-Cod 5-6.25-10 MG/5ML SYRP Take 5 mLs by mouth every 4 (four) hours as needed. 09/05/13   Hope Bunnie Pion, NP   BP 151/61 mmHg  Pulse 88  Temp(Src)  98.2 F (36.8 C) (Oral)  Resp 22  SpO2 99% Physical Exam  Constitutional: She is oriented to person, place, and time. She appears well-developed.  HENT:  Head: Normocephalic and atraumatic.  Eyes: EOM are normal.  Neck: Normal range of motion.  Cardiovascular: Normal rate.   Pulmonary/Chest: Effort normal. No respiratory distress.  Musculoskeletal: Normal range of motion. She exhibits no tenderness.  Neurological: She is alert and oriented to person, place, and time. No cranial nerve deficit. Coordination normal.  Skin: Skin is warm. No rash noted.  Psychiatric: She has a normal mood and affect. Her behavior is normal. Judgment and thought content normal.    ED Course  Procedures (including critical care time) Labs Review Labs Reviewed - No data to display  Imaging Review No results found.   MDM   1. Yeast vaginitis    Diflucan 150mg  PO x1 then repeat in 3 days F/u precautions given and all questions answered  Linna Darner, MD Family Medicine 12/05/2013, 1:54 PM     Waldemar Dickens, MD 12/05/13 [phone]

## 2013-12-05 NOTE — Discharge Instructions (Signed)
You likely have a yeast vaginal infection Please start the diflucan - follow the dosage instructions Please come back if you develop any further symptoms

## 2013-12-05 NOTE — ED Notes (Signed)
Pt states that she has had a thick white discharge with no relief from OTC monistat

## 2014-02-14 DIAGNOSIS — F4322 Adjustment disorder with anxiety: Secondary | ICD-10-CM | POA: Insufficient documentation

## 2014-02-14 DIAGNOSIS — N760 Acute vaginitis: Secondary | ICD-10-CM | POA: Insufficient documentation

## 2014-04-10 ENCOUNTER — Emergency Department (HOSPITAL_COMMUNITY)
Admission: EM | Admit: 2014-04-10 | Discharge: 2014-04-10 | Disposition: A | Discharge status: Home / Self Care | Payer: Medicaid Other | Source: Home / Self Care | Attending: Family Medicine | Admitting: Family Medicine

## 2014-04-10 ENCOUNTER — Encounter (HOSPITAL_COMMUNITY): Payer: Self-pay | Admitting: Emergency Medicine

## 2014-04-10 DIAGNOSIS — R300 Dysuria: Secondary | ICD-10-CM | POA: Diagnosis not present

## 2014-04-10 LAB — POCT URINALYSIS DIP (DEVICE)
BILIRUBIN URINE: NEGATIVE
GLUCOSE, UA: NEGATIVE mg/dL
Hgb urine dipstick: NEGATIVE
KETONES UR: NEGATIVE mg/dL
LEUKOCYTES UA: NEGATIVE
NITRITE: NEGATIVE
Protein, ur: NEGATIVE mg/dL
Specific Gravity, Urine: 1.025 (ref 1.005–1.030)
Urobilinogen, UA: 0.2 mg/dL (ref 0.0–1.0)
pH: 6 (ref 5.0–8.0)

## 2014-04-10 NOTE — Discharge Instructions (Signed)
Thank you for coming in today.  If your belly pain worsens, or you have high fever, bad vomiting, blood in your stool or black tarry stool go to the Emergency Room.    Dysuria Dysuria is the medical term for pain with urination. There are many causes for dysuria, but urinary tract infection is the most common. If a urinalysis was performed it can show that there is a urinary tract infection. A urine culture confirms that you or your child is sick. You will need to follow up with a healthcare provider because:  If a urine culture was done you will need to know the culture results and treatment recommendations.  If the urine culture was positive, you or your child will need to be put on antibiotics or know if the antibiotics prescribed are the right antibiotics for your urinary tract infection.  If the urine culture is negative (no urinary tract infection), then other causes may need to be explored or antibiotics need to be stopped. Today laboratory work may have been done and there does not seem to be an infection. If cultures were done they will take at least 24 to 48 hours to be completed. Today x-rays may have been taken and they read as normal. No cause can be found for the problems. The x-rays may be re-read by a radiologist and you will be contacted if additional findings are made. You or your child may have been put on medications to help with this problem until you can see your primary caregiver. If the problems get better, see your primary caregiver if the problems return. If you were given antibiotics (medications which kill germs), take all of the mediations as directed for the full course of treatment.  If laboratory work was done, you need to find the results. Leave a telephone number where you can be reached. If this is not possible, make sure you find out how you are to get test results. HOME CARE INSTRUCTIONS   Drink lots of fluids. For adults, drink eight, 8 ounce glasses of clear  juice or water a day. For children, replace fluids as suggested by your caregiver.  Empty the bladder often. Avoid holding urine for long periods of time.  After a bowel movement, women should cleanse front to back, using each tissue only once.  Empty your bladder before and after sexual intercourse.  Take all the medicine given to you until it is gone. You may feel better in a few days, but TAKE ALL MEDICINE.  Avoid caffeine, tea, alcohol and carbonated beverages, because they tend to irritate the bladder.  In men, alcohol may irritate the prostate.  Only take over-the-counter or prescription medicines for pain, discomfort, or fever as directed by your caregiver.  If your caregiver has given you a follow-up appointment, it is very important to keep that appointment. Not keeping the appointment could result in a chronic or permanent injury, pain, and disability. If there is any problem keeping the appointment, you must call back to this facility for assistance. SEEK IMMEDIATE MEDICAL CARE IF:   Back pain develops.  A fever develops.  There is nausea (feeling sick to your stomach) or vomiting (throwing up).  Problems are no better with medications or are getting worse. MAKE SURE YOU:   Understand these instructions.  Will watch your condition.  Will get help right away if you are not doing well or get worse. Document Released: 09/18/2003 Document Revised: 03/14/2011 Document Reviewed: 07/26/2007 [employer] Patient Information 2015  [employer]. This information is not intended to replace advice given to you by your health care provider. Make sure you discuss any questions you have with your health care provider.

## 2014-04-10 NOTE — ED Provider Notes (Signed)
Jaime Castaneda is a 44 y.o. female who presents to Urgent Care today for urinary frequency urgency and dysuria present for 3 days. Patient was treated a month and a half ago for urinary tract infection and notes continued mild dysuria. Her symptoms worsened 2 days ago. No vaginal discharge fevers chills nausea vomiting or diarrhea. No abdominal pain.   Past Medical History  Diagnosis Date  . TMJ arthralgia   . Asthma    Past Surgical History  Procedure Laterality Date  . Tubal ligation    . Shoulder surgery     History  Substance Use Topics  . Smoking status: Never Smoker   . Smokeless tobacco: Not on file  . Alcohol Use: No   ROS as above Medications: No current facility-administered medications for this encounter.   Current Outpatient Prescriptions  Medication Sig Dispense Refill  . ALBUTEROL IN Inhale into the lungs.    Marland Kitchen dextromethorphan (DELSYM) 30 MG/5ML liquid Take 5 mLs (30 mg total) by mouth 2 (two) times daily as needed for cough. 148 mL 0  . fluconazole (DIFLUCAN) 150 MG tablet Take 1 tablet (150 mg total) by mouth daily. Repeat dose in 3 days 2 tablet 0  . ipratropium (ATROVENT) 0.06 % nasal spray Place 2 sprays into both nostrils 4 (four) times daily. 15 mL 0  . Multiple Vitamins-Minerals (HAIR/SKIN/NAILS) TABS Take 1 tablet by mouth daily.      Marland Kitchen Phenyleph-Promethazine-Cod 5-6.25-10 MG/5ML SYRP Take 5 mLs by mouth every 4 (four) hours as needed. 120 mL 0   No Known Allergies   Exam:  BP 162/109 mmHg  Pulse 100  Temp(Src) 97.7 F (36.5 C) (Oral)  Resp 18  SpO2 100%  LMP 03/19/2014 Gen: Well NAD HEENT: EOMI,  MMM Lungs: Normal work of breathing. CTABL Heart: RRR no MRG Abd: NABS, Soft. Nondistended, Nontender no CV angle tenderness to percussion Exts: Brisk capillary refill, warm and well perfused.   Results for orders placed or performed during the hospital encounter of 04/10/14 (from the past 24 hour(s))  POCT urinalysis dip (device)     Status: None    Collection Time: 04/10/14  4:30 PM  Result Value Ref Range   Glucose, UA NEGATIVE NEGATIVE mg/dL   Bilirubin Urine NEGATIVE NEGATIVE   Ketones, ur NEGATIVE NEGATIVE mg/dL   Specific Gravity, Urine 1.025 1.005 - 1.030   Hgb urine dipstick NEGATIVE NEGATIVE   pH 6.0 5.0 - 8.0   Protein, ur NEGATIVE NEGATIVE mg/dL   Urobilinogen, UA 0.2 0.0 - 1.0 mg/dL   Nitrite NEGATIVE NEGATIVE   Leukocytes, UA NEGATIVE NEGATIVE   No results found.  Assessment and Plan: 44 y.o. female with dysuria. Culture pending. Patient declines pelvic exam. Will call in antibiotics as needed. Follow-up with PCP.  Discussed warning signs or symptoms. Please see discharge instructions. Patient expresses understanding.     Gregor Hams, MD 04/10/14 [phone]

## 2014-04-10 NOTE — ED Notes (Signed)
C/o  Urinary frequency x 3 days.  Denies any abnormal discharge or irritation.  No pelvic or abdominal pain.   No otc meds. Tried.

## 2014-04-12 LAB — URINE CULTURE
COLONY COUNT: NO GROWTH
Culture: NO GROWTH
SPECIAL REQUESTS: NORMAL

## 2014-04-17 ENCOUNTER — Telehealth (HOSPITAL_COMMUNITY): Payer: Self-pay | Admitting: *Deleted

## 2014-04-17 NOTE — ED Notes (Signed)
Pt. called for her lab results. Pt. verified x 2 and given neg. result on urine culture-no growth. Roselyn Meier 04/17/2014

## 2014-06-28 ENCOUNTER — Encounter (HOSPITAL_COMMUNITY): Payer: Self-pay | Admitting: *Deleted

## 2014-06-28 ENCOUNTER — Inpatient Hospital Stay (HOSPITAL_COMMUNITY): Payer: Medicaid Other

## 2014-06-28 ENCOUNTER — Inpatient Hospital Stay (HOSPITAL_COMMUNITY)
Admission: AD | Admit: 2014-06-28 | Discharge: 2014-06-28 | Disposition: A | Discharge status: Home / Self Care | Payer: Medicaid Other | Source: Ambulatory Visit | Attending: Obstetrics & Gynecology | Admitting: Obstetrics & Gynecology

## 2014-06-28 DIAGNOSIS — N920 Excessive and frequent menstruation with regular cycle: Secondary | ICD-10-CM | POA: Diagnosis not present

## 2014-06-28 DIAGNOSIS — D259 Leiomyoma of uterus, unspecified: Secondary | ICD-10-CM | POA: Diagnosis not present

## 2014-06-28 HISTORY — DX: Anemia, unspecified: D64.9

## 2014-06-28 LAB — URINALYSIS, ROUTINE W REFLEX MICROSCOPIC
Bilirubin Urine: NEGATIVE
Glucose, UA: NEGATIVE mg/dL
Ketones, ur: NEGATIVE mg/dL
LEUKOCYTES UA: NEGATIVE
NITRITE: NEGATIVE
Protein, ur: NEGATIVE mg/dL
Specific Gravity, Urine: >1.03 — ABNORMAL HIGH (ref 1.005–1.030)
Urobilinogen, UA: 0.2 mg/dL (ref 0.0–1.0)
pH: 5.5 (ref 5.0–8.0)

## 2014-06-28 LAB — URINE MICROSCOPIC-ADD ON

## 2014-06-28 LAB — WET PREP, GENITAL
Clue Cells Wet Prep HPF POC: NONE SEEN
Trich, Wet Prep: NONE SEEN
Yeast Wet Prep HPF POC: NONE SEEN

## 2014-06-28 LAB — POCT PREGNANCY, URINE: Preg Test, Ur: NEGATIVE

## 2014-06-28 LAB — CBC
HEMATOCRIT: 27.8 % — AB (ref 36.0–46.0)
HEMOGLOBIN: 8 g/dL — AB (ref 12.0–15.0)
MCH: 20.4 pg — AB (ref 26.0–34.0)
MCHC: 28.8 g/dL — ABNORMAL LOW (ref 30.0–36.0)
MCV: 70.9 fL — AB (ref 78.0–100.0)
Platelets: 372 10*3/uL (ref 150–400)
RBC: 3.92 MIL/uL (ref 3.87–5.11)
RDW: 18.1 % — AB (ref 11.5–15.5)
WBC: 6.3 10*3/uL (ref 4.0–10.5)

## 2014-06-28 MED ORDER — MEGESTROL ACETATE 20 MG PO TABS: 80.0000 mg | ORAL_TABLET | Freq: Every day | ORAL | Status: DC

## 2014-06-28 MED ORDER — DOCUSATE SODIUM 100 MG PO CAPS: 100.0000 mg | ORAL_CAPSULE | Freq: Two times a day (BID) | ORAL | Status: DC | PRN

## 2014-06-28 MED ORDER — MEGESTROL ACETATE 40 MG PO TABS
40.0000 mg | ORAL_TABLET | Freq: Once | ORAL | Status: AC
  Administered 2014-06-28: 40 mg via ORAL
  Filled 2014-06-28: qty 1

## 2014-06-28 MED ORDER — FERROUS SULFATE 325 (65 FE) MG PO TABS: 325.0000 mg | ORAL_TABLET | Freq: Two times a day (BID) | ORAL | Status: DC

## 2014-06-28 NOTE — MAU Note (Addendum)
I have asthma. I have persistent cough for 3 days and started vomiting from cough. Having my period and bleeding heavily for couple yrs. 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 Sometimes I feel faint and have hx of anemia. Using 4-6 pads daily. Passing nickle -sized clots. Thinks has hx of fibroids on cervix and no pap smear in 37yrs

## 2014-06-28 NOTE — MAU Provider Note (Signed)
Chief Complaint: Vaginal Bleeding and Abdominal Pain   First Provider Initiated Contact with Patient 06/28/14 0133      SUBJECTIVE HPI: Jaime Castaneda is a 44 y.o. [id number] female who presents to [facility] reporting heavy periods since tubal ligation in 2012. LMP started 2 days ago 06/26/14, heavy, initially, but lighter today. Reports fatigue and mild dizziness. Soaks 4-6 pads per day at the beginning of per cycle. Regular monthly cycles. Has gotten significantly anemic from menstrual bleeding in the past, but never needed a transfusion. Hasn't been to a Editor, commissioning since [place] and thinks she hasn't has a Pap in 13 years.   Thinks she may have fibroids. She states she is in a mutually monogamous relationship.  Past Medical History  Diagnosis Date  . TMJ arthralgia   . Asthma   . Anemia    OB History  Gravida Para Term Preterm AB SAB TAB Ectopic Multiple Living  2 2 2       2     # Outcome Date GA Lbr Len/2nd Weight Sex Delivery Anes PTL Lv  2 Term           1 Term              Past Surgical History  Procedure Laterality Date  . Tubal ligation    . Shoulder surgery     History   Social History  . Marital Status: Married    Spouse Name: N/A  . Number of Children: N/A  . Years of Education: N/A   Occupational History  . Not on file.   Social History Main Topics  . Smoking status: Never Smoker   . Smokeless tobacco: Not on file  . Alcohol Use: No  . Drug Use: No  . Sexual Activity: Not on file   Other Topics Concern  . Not on file   Social History Narrative   No current facility-administered medications on file prior to encounter.   Current Outpatient Prescriptions on File Prior to Encounter  Medication Sig Dispense Refill  . ALBUTEROL IN Inhale into the lungs.    Marland Kitchen dextromethorphan (DELSYM) 30 MG/5ML liquid Take 5 mLs (30 mg total) by mouth 2 (two) times daily as needed for cough. 148 mL 0  . fluconazole (DIFLUCAN) 150 MG tablet Take 1 tablet (150 mg  total) by mouth daily. Repeat dose in 3 days 2 tablet 0  . ipratropium (ATROVENT) 0.06 % nasal spray Place 2 sprays into both nostrils 4 (four) times daily. 15 mL 0  . Multiple Vitamins-Minerals (HAIR/SKIN/NAILS) TABS Take 1 tablet by mouth daily.      Marland Kitchen Phenyleph-Promethazine-Cod 5-6.25-10 MG/5ML SYRP Take 5 mLs by mouth every 4 (four) hours as needed. 120 mL 0   No Known Allergies  Review of Systems  Constitutional: Positive for malaise/fatigue. Negative for fever and chills.  Respiratory: Negative for hemoptysis.   Gastrointestinal: Positive for abdominal pain (cramping w/ menses). Negative for nausea, vomiting, diarrhea, constipation and blood in stool.  Genitourinary: Negative for dysuria, urgency, frequency and hematuria.  Musculoskeletal: Negative for falls.  Neurological: Positive for dizziness. Negative for loss of consciousness and weakness.  Endo/Heme/Allergies: Does not bruise/bleed easily.    OBJECTIVE Blood pressure 150/82, pulse 89, temperature 98.4 F (36.9 C), resp. rate 20, height 5\' 6"  (1.676 m), weight 313 lb 3.2 oz (142.067 kg), last menstrual period 06/26/2014. GENERAL: Well-developed, well-nourished, obese female in no acute distress.  HEART: normal rate RESP: normal effort GI: Abdomen soft, non-tender. Positive bowel sounds 4.  MS: Nontender, no edema NEURO: Alert and oriented SPECULUM EXAM: NEFG, moderate amount of dark red blood. No clots. No odor. Cervix clean, non-friable. BIMANUAL: cervix closed; uterine size difficult to assess due to body habitus, no adnexal tenderness or masses. No CMT.   LAB RESULTS Results for orders placed or performed during the hospital encounter of 06/28/14 (from the past 24 hour(s))  Urinalysis, Routine w reflex microscopic (not at [facility])     Status: Abnormal   Collection Time: 06/28/14 12:50 AM  Result Value Ref Range   Color, Urine YELLOW YELLOW   APPearance CLEAR CLEAR   Specific Gravity, Urine >1.030 (H) 1.005 - 1.030    pH 5.5 5.0 - 8.0   Glucose, UA NEGATIVE NEGATIVE mg/dL   Hgb urine dipstick LARGE (A) NEGATIVE   Bilirubin Urine NEGATIVE NEGATIVE   Ketones, ur NEGATIVE NEGATIVE mg/dL   Protein, ur NEGATIVE NEGATIVE mg/dL   Urobilinogen, UA 0.2 0.0 - 1.0 mg/dL   Nitrite NEGATIVE NEGATIVE   Leukocytes, UA NEGATIVE NEGATIVE  Urine microscopic-add on     Status: Abnormal   Collection Time: 06/28/14 12:50 AM  Result Value Ref Range   Squamous Epithelial / LPF RARE RARE   WBC, UA 0-2 <3 WBC/hpf   RBC / HPF 21-50 <3 RBC/hpf   Bacteria, UA FEW (A) RARE  Pregnancy, urine POC     Status: None   Collection Time: 06/28/14  1:07 AM  Result Value Ref Range   Preg Test, Ur NEGATIVE NEGATIVE  CBC     Status: Abnormal   Collection Time: 06/28/14  1:35 AM  Result Value Ref Range   WBC 6.3 4.0 - 10.5 K/uL   RBC 3.92 3.87 - 5.11 MIL/uL   Hemoglobin 8.0 (L) 12.0 - 15.0 g/dL   HCT 27.8 (L) 36.0 - 46.0 %   MCV 70.9 (L) 78.0 - 100.0 fL   MCH 20.4 (L) 26.0 - 34.0 pg   MCHC 28.8 (L) 30.0 - 36.0 g/dL   RDW 18.1 (H) 11.5 - 15.5 %   Platelets 372 150 - 400 K/uL    IMAGING No results found.  MAU COURSE CBC, UPT, UA.   First dose Megace given.  Pt anemic, but only mildly symptomatic and bleeding improving spontaneously. Will Rx Megace to stay on until F/U visit to give H/H a chance to recover. Also instructed to take PO iron and increase dietary iron and PO fluids.     ASSESSMENT 1. Uterine leiomyoma, unspecified location   2. Menorrhagia with regular cycle    PLAN Discharge home in stable condition. Bleeding precautions.   Discussed management options for menorrhagia including oral Provera, Depo Provera, Mirena IUD, endometrial ablation (Novasure/Hydrothermal Ablation/Thermachoice) or hysterectomy as definitive surgical management. Discussed risks and benefits of each method. May need EBX. Will discuss w/ Gyn Follow-up Information    Follow up with [facility].   Specialty:   Obstetrics and Gynecology   Why:  For gynecology care   Contact information:   [address] [place] [address] [phone]      Follow up with [facility].   Why:  As needed in emergencies   Contact information:   [address] [id number] [place] [phone]        Medication List    TAKE these medications        ALBUTEROL IN  Inhale into the lungs.     dextromethorphan 30 MG/5ML  liquid  Commonly known as:  DELSYM  Take 5 mLs (30 mg total) by mouth 2 (two) times daily as needed for cough.     docusate sodium 100 MG capsule  Commonly known as:  COLACE  Take 1 capsule (100 mg total) by mouth 2 (two) times daily as needed.     ferrous sulfate 325 (65 FE) MG tablet  Commonly known as:  FERROUSUL  Take 1 tablet (325 mg total) by mouth 2 (two) times daily with a meal.     fluconazole 150 MG tablet  Commonly known as:  DIFLUCAN  Take 1 tablet (150 mg total) by mouth daily. Repeat dose in 3 days     HAIR/SKIN/NAILS Tabs  Take 1 tablet by mouth daily.     ipratropium 0.06 % nasal spray  Commonly known as:  ATROVENT  Place 2 sprays into both nostrils 4 (four) times daily.     megestrol 20 MG tablet  Commonly known as:  MEGACE  Take 4 tablets (80 mg total) by mouth daily.     Phenyleph-Promethazine-Cod 5-6.25-10 MG/5ML Syrp  Take 5 mLs by mouth every 4 (four) hours as needed.       Hedrick, CNM 06/28/2014  1:30 AM

## 2014-06-28 NOTE — Discharge Instructions (Signed)
Abnormal Uterine Bleeding Abnormal uterine bleeding can affect women at various stages in life, including teenagers, women in their reproductive years, pregnant women, and women who have reached menopause. Several kinds of uterine bleeding are considered abnormal, including:  Bleeding or spotting between periods.   Bleeding after sexual intercourse.   Bleeding that is heavier or more than normal.   Periods that last longer than usual.  Bleeding after menopause.  Many cases of abnormal uterine bleeding are minor and simple to treat, while others are more serious. Any type of abnormal bleeding should be evaluated by your health care provider. Treatment will depend on the cause of the bleeding. HOME CARE INSTRUCTIONS Monitor your condition for any changes. The following actions may help to alleviate any discomfort you are experiencing:  Avoid the use of tampons and douches as directed by your health care provider.  Change your pads frequently. You should get regular pelvic exams and Pap tests. Keep all follow-up appointments for diagnostic tests as directed by your health care provider.  SEEK MEDICAL CARE IF:   Your bleeding lasts more than 1 week.   You feel dizzy at times.  SEEK IMMEDIATE MEDICAL CARE IF:   You pass out.   You are changing pads every 15 to 30 minutes.   You have abdominal pain.  You have a fever.   You become sweaty or weak.   You are passing large blood clots from the vagina.   You start to feel nauseous and vomit. MAKE SURE YOU:   Understand these instructions.  Will watch your condition.  Will get help right away if you are not doing well or get worse. Document Released: 12/20/2004 Document Revised: 12/25/2012 Document Reviewed: 07/19/2012 [facility] Patient Information 2015 [place], [place]. This information is not intended to replace advice given to you by your health care provider. Make sure you discuss any questions you have with your  health care provider.  Levonorgestrel intrauterine device (IUD) What is this medicine? LEVONORGESTREL IUD (LEE voe nor jes trel) is a contraceptive (birth control) device. The device is placed inside the uterus by a healthcare professional. It is used to prevent pregnancy and can also be used to treat heavy bleeding that occurs during your period. Depending on the device, it can be used for 3 to 5 years. This medicine may be used for other purposes; ask your health care provider or pharmacist if you have questions. COMMON BRAND NAME(S): Verda Cumins What should I tell my health care provider before I take this medicine? They need to know if you have any of these conditions: -abnormal Pap smear -cancer of the breast, uterus, or cervix -diabetes -endometritis -genital or pelvic infection now or in the past -have more than one sexual partner or your partner has more than one partner -heart disease -history of an ectopic or tubal pregnancy -immune system problems -IUD in place -liver disease or tumor -problems with blood clots or take blood-thinners -use intravenous drugs -uterus of unusual shape -vaginal bleeding that has not been explained -an unusual or allergic reaction to levonorgestrel, other hormones, silicone, or polyethylene, medicines, foods, dyes, or preservatives -pregnant or trying to get pregnant -breast-feeding How should I use this medicine? This device is placed inside the uterus by a health care professional. Talk to your pediatrician regarding the use of this medicine in children. Special care may be needed. Overdosage: If you think you have taken too much of this medicine contact a poison control center or emergency room at  once. NOTE: This medicine is only for you. Do not share this medicine with others. What if I miss a dose? This does not apply. What may interact with this medicine? Do not take this medicine with any of the following  medications: -amprenavir -bosentan -fosamprenavir This medicine may also interact with the following medications: -aprepitant -barbiturate medicines for inducing sleep or treating seizures -bexarotene -griseofulvin -medicines to treat seizures like carbamazepine, ethotoin, felbamate, oxcarbazepine, phenytoin, topiramate -modafinil -pioglitazone -rifabutin -rifampin -rifapentine -some medicines to treat HIV infection like atazanavir, indinavir, lopinavir, nelfinavir, tipranavir, ritonavir -St. John's wort -warfarin This list may not describe all possible interactions. Give your health care provider a list of all the medicines, herbs, non-prescription drugs, or dietary supplements you use. Also tell them if you smoke, drink alcohol, or use illegal drugs. Some items may interact with your medicine. What should I watch for while using this medicine? Visit your doctor or health care professional for regular check ups. See your doctor if you or your partner has sexual contact with others, becomes HIV positive, or gets a sexual transmitted disease. This product does not protect you against HIV infection (AIDS) or other sexually transmitted diseases. You can check the placement of the IUD yourself by reaching up to the top of your vagina with clean fingers to feel the threads. Do not pull on the threads. It is a good habit to check placement after each menstrual period. Call your doctor right away if you feel more of the IUD than just the threads or if you cannot feel the threads at all. The IUD may come out by itself. You may become pregnant if the device comes out. If you notice that the IUD has come out use a backup birth control method like condoms and call your health care provider. Using tampons will not change the position of the IUD and are okay to use during your period. What side effects may I notice from receiving this medicine? Side effects that you should report to your doctor or  health care professional as soon as possible: -allergic reactions like skin rash, itching or hives, swelling of the face, lips, or tongue -fever, flu-like symptoms -genital sores -high blood pressure -no menstrual period for 6 weeks during use -pain, swelling, warmth in the leg -pelvic pain or tenderness -severe or sudden headache -signs of pregnancy -stomach cramping -sudden shortness of breath -trouble with balance, talking, or walking -unusual vaginal bleeding, discharge -yellowing of the eyes or skin Side effects that usually do not require medical attention (report to your doctor or health care professional if they continue or are bothersome): -acne -breast pain -change in sex drive or performance -changes in weight -cramping, dizziness, or faintness while the device is being inserted -headache -irregular menstrual bleeding within first 3 to 6 months of use -nausea This list may not describe all possible side effects. Call your doctor for medical advice about side effects. You may report side effects to FDA at 1-800-FDA-1088. Where should I keep my medicine? This does not apply. NOTE: This sheet is a summary. It may not cover all possible information. If you have questions about this medicine, talk to your doctor, pharmacist, or health care provider.  2015, Elsevier/Gold Standard. (2011-01-20 13:54:04)   Endometrial Ablation Endometrial ablation removes the lining of the uterus (endometrium). It is usually a same-day, outpatient treatment. Ablation helps avoid major surgery, such as surgery to remove the cervix and uterus (hysterectomy). After endometrial ablation, you will have little or no menstrual  bleeding and may not be able to have children. However, if you are premenopausal, you will need to use a reliable method of birth control following the procedure because of the small chance that pregnancy can occur. There are different reasons to have this procedure, which  include:  Heavy periods.  Bleeding that is causing anemia.  Irregular bleeding.  Bleeding fibroids on the lining inside the uterus if they are smaller than 3 centimeters. This procedure should not be done if:  You want children in the future.  You have severe cramps with your menstrual period.  You have precancerous or cancerous cells in your uterus.  You were recently pregnant.  You have gone through menopause.  You have had major surgery on the uterus, such as a cesarean delivery. LET [facility] CARE PROVIDER KNOW ABOUT:  Any allergies you have.  All medicines you are taking, including vitamins, herbs, eye drops, creams, and over-the-counter medicines.  Previous problems you or members of your family have had with the use of anesthetics.  Any blood disorders you have.  Previous surgeries you have had.  Medical conditions you have. RISKS AND COMPLICATIONS  Generally, this is a safe procedure. However, as with any procedure, complications can occur. Possible complications include:  Perforation of the uterus.  Bleeding.  Infection of the uterus, bladder, or vagina.  Injury to surrounding organs.  An air bubble to the lung (air embolus).  Pregnancy following the procedure.  Failure of the procedure to help the problem, requiring hysterectomy.  Decreased ability to diagnose cancer in the lining of the uterus. BEFORE THE PROCEDURE  The lining of the uterus must be tested to make sure there is no pre-cancerous or cancer cells present.  An ultrasound may be performed to look at the size of the uterus and to check for abnormalities.  Medicines may be given to thin the lining of the uterus. PROCEDURE  During the procedure, your health care provider will use a tool called a resectoscope to help see inside your uterus. There are different ways to remove the lining of your uterus.   Radiofrequency - This method uses a radiofrequency-alternating electric current to  remove the lining of the uterus.  Cryotherapy - This method uses extreme cold to freeze the lining of the uterus.  Heated-Free Liquid - This method uses heated salt (saline) solution to remove the lining of the uterus.  Microwave - This method uses high-energy microwaves to heat up the lining of the uterus to remove it.  Thermal balloon - This method involves inserting a catheter with a balloon tip into the uterus. The balloon tip is filled with heated fluid to remove the lining of the uterus. AFTER THE PROCEDURE  After your procedure, do not have sexual intercourse or insert anything into your vagina until permitted by your health care provider. After the procedure, you may experience:  Cramps.  Vaginal discharge.  Frequent urination. Document Released: 10/30/2003 Document Revised: 08/22/2012 Document Reviewed: 05/23/2012 [facility] Patient Information 2015 [place], [place]. This information is not intended to replace advice given to you by your health care provider. Make sure you discuss any questions you have with your health care provider.

## 2014-06-28 NOTE — Progress Notes (Signed)
Marlou Porch CNM in to discuss test results and d/c plan. Written and verbal d/c instructions given and understanding voiced.

## 2014-06-30 LAB — GC/CHLAMYDIA PROBE AMP (~~LOC~~) NOT AT ARMC
Chlamydia: NEGATIVE
Neisseria Gonorrhea: NEGATIVE

## 2014-07-10 ENCOUNTER — Encounter (HOSPITAL_COMMUNITY): Payer: Self-pay | Admitting: *Deleted

## 2014-07-10 ENCOUNTER — Inpatient Hospital Stay (HOSPITAL_COMMUNITY)
Admission: AD | Admit: 2014-07-10 | Discharge: 2014-07-10 | Disposition: A | Discharge status: Home / Self Care | Payer: Medicaid Other | Source: Ambulatory Visit | Attending: Family Medicine | Admitting: Family Medicine

## 2014-07-10 DIAGNOSIS — N939 Abnormal uterine and vaginal bleeding, unspecified: Secondary | ICD-10-CM | POA: Diagnosis not present

## 2014-07-10 LAB — URINALYSIS, ROUTINE W REFLEX MICROSCOPIC
Bilirubin Urine: NEGATIVE
Glucose, UA: NEGATIVE mg/dL
Ketones, ur: NEGATIVE mg/dL
NITRITE: NEGATIVE
Protein, ur: 30 mg/dL — AB
SPECIFIC GRAVITY, URINE: 1.02 (ref 1.005–1.030)
UROBILINOGEN UA: 0.2 mg/dL (ref 0.0–1.0)
pH: 5 (ref 5.0–8.0)

## 2014-07-10 LAB — CBC WITH DIFFERENTIAL/PLATELET
BASOS ABS: 0 10*3/uL (ref 0.0–0.1)
Basophils Relative: 1 % (ref 0–1)
Eosinophils Absolute: 0.1 10*3/uL (ref 0.0–0.7)
Eosinophils Relative: 2 % (ref 0–5)
HEMATOCRIT: 30.3 % — AB (ref 36.0–46.0)
HEMOGLOBIN: 8.5 g/dL — AB (ref 12.0–15.0)
LYMPHS PCT: 44 % (ref 12–46)
Lymphs Abs: 2 10*3/uL (ref 0.7–4.0)
MCH: 20.1 pg — ABNORMAL LOW (ref 26.0–34.0)
MCHC: 28.1 g/dL — ABNORMAL LOW (ref 30.0–36.0)
MCV: 71.6 fL — AB (ref 78.0–100.0)
MONO ABS: 0.2 10*3/uL (ref 0.1–1.0)
MONOS PCT: 5 % (ref 3–12)
NEUTROS ABS: 2.1 10*3/uL (ref 1.7–7.7)
Neutrophils Relative %: 47 % (ref 43–77)
Platelets: 407 10*3/uL — ABNORMAL HIGH (ref 150–400)
RBC: 4.23 MIL/uL (ref 3.87–5.11)
RDW: 18.1 % — ABNORMAL HIGH (ref 11.5–15.5)
WBC: 4.5 10*3/uL (ref 4.0–10.5)

## 2014-07-10 LAB — URINE MICROSCOPIC-ADD ON

## 2014-07-10 LAB — POCT PREGNANCY, URINE: PREG TEST UR: NEGATIVE

## 2014-07-10 MED ORDER — TRANEXAMIC ACID 650 MG PO TABS: 1300.0000 mg | ORAL_TABLET | Freq: Three times a day (TID) | ORAL | Status: DC

## 2014-07-10 NOTE — MAU Note (Signed)
Bleeding has continued since she was last here.  Megace did not help, seemed to get heavier- so she stopped taking. Feeling of fatigue and dizziness increased. Still passing clots.

## 2014-07-10 NOTE — Discharge Instructions (Signed)
Abnormal Uterine Bleeding Abnormal uterine bleeding can affect women at various stages in life, including teenagers, women in their reproductive years, pregnant women, and women who have reached menopause. Several kinds of uterine bleeding are considered abnormal, including:  Bleeding or spotting between periods.   Bleeding after sexual intercourse.   Bleeding that is heavier or more than normal.   Periods that last longer than usual.  Bleeding after menopause.  Many cases of abnormal uterine bleeding are minor and simple to treat, while others are more serious. Any type of abnormal bleeding should be evaluated by your health care provider. Treatment will depend on the cause of the bleeding. HOME CARE INSTRUCTIONS Monitor your condition for any changes. The following actions may help to alleviate any discomfort you are experiencing:  Avoid the use of tampons and douches as directed by your health care provider.  Change your pads frequently. You should get regular pelvic exams and Pap tests. Keep all follow-up appointments for diagnostic tests as directed by your health care provider.  SEEK MEDICAL CARE IF:   Your bleeding lasts more than 1 week.   You feel dizzy at times.  SEEK IMMEDIATE MEDICAL CARE IF:   You pass out.   You are changing pads every 15 to 30 minutes.   You have abdominal pain.  You have a fever.   You become sweaty or weak.   You are passing large blood clots from the vagina.   You start to feel nauseous and vomit. MAKE SURE YOU:   Understand these instructions.  Will watch your condition.  Will get help right away if you are not doing well or get worse. Document Released: 12/20/2004 Document Revised: 12/25/2012 Document Reviewed: 07/19/2012 [employer] Patient Information 2015 [employer]. This information is not intended to replace advice given to you by your health care provider. Make sure you discuss any questions you have with your  health care provider.  

## 2014-07-10 NOTE — MAU Provider Note (Signed)
History     [name]: [id number]  Arrival date and time: 07/10/14 1029   First Provider Initiated Contact with Patient 07/10/14 1104      Chief Complaint  Patient presents with  . Vaginal Bleeding   HPI Jaime Castaneda 44 y.o. [id number] presents for vaginal bleeding.  LMP was 6/23.  She came to MAU on 6/25.  She was given Megace which she used 4 tabs daily and noted an increase in bleeding and blood clots.  This was continued until yesterday.  She stopped because she felt it was making her bleeding worse overall.  The bleeding has not yet changed.  She soaks a pad every 2-3 hours.  She has associated cramping that is slight.  Usually her menses are heavy but only 4 days max.  She denies CP, SOB, dysuria, odor or discharge.   OB History    Gravida Para Term Preterm AB TAB SAB Ectopic Multiple Living   2 2 2       2       Past Medical History  Diagnosis Date  . TMJ arthralgia   . Asthma   . Anemia     Past Surgical History  Procedure Laterality Date  . Tubal ligation    . Shoulder surgery      History reviewed. No pertinent family history.  History  Substance Use Topics  . Smoking status: Never Smoker   . Smokeless tobacco: Not on file  . Alcohol Use: No    Allergies: No Known Allergies  Prescriptions prior to admission  Medication Sig Dispense Refill Last Dose  . ALBUTEROL IN Inhale into the lungs.   09/05/2013 at Unknown time  . dextromethorphan (DELSYM) 30 MG/5ML liquid Take 5 mLs (30 mg total) by mouth 2 (two) times daily as needed for cough. 148 mL 0 Unknown at Unknown time  . docusate sodium (COLACE) 100 MG capsule Take 1 capsule (100 mg total) by mouth 2 (two) times daily as needed. 30 capsule 2   . ferrous sulfate (FERROUSUL) 325 (65 FE) MG tablet Take 1 tablet (325 mg total) by mouth 2 (two) times daily with a meal. 60 tablet 1   . fluconazole (DIFLUCAN) 150 MG tablet Take 1 tablet (150 mg total) by mouth daily. Repeat dose in 3 days 2 tablet 0   . ipratropium  (ATROVENT) 0.06 % nasal spray Place 2 sprays into both nostrils 4 (four) times daily. 15 mL 0 Unknown at Unknown time  . megestrol (MEGACE) 20 MG tablet Take 4 tablets (80 mg total) by mouth daily. 60 tablet 3   . Multiple Vitamins-Minerals (HAIR/SKIN/NAILS) TABS Take 1 tablet by mouth daily.     Unknown at Unknown time  . Phenyleph-Promethazine-Cod 5-6.25-10 MG/5ML SYRP Take 5 mLs by mouth every 4 (four) hours as needed. 120 mL 0     ROS Pertinent ROS in HPI.  All other systems are negative.   Physical Exam   Blood pressure 154/83, pulse 87, temperature 98.3 F (36.8 C), temperature source Oral, resp. rate 18, last menstrual period 06/26/2014.  Physical Exam  Constitutional: She is oriented to person, place, and time. She appears well-developed and well-nourished. No distress.  HENT:  Head: Normocephalic and atraumatic.  Eyes: EOM are normal.  Neck: Normal range of motion.  Cardiovascular: Normal rate and regular rhythm.   Respiratory: Effort normal and breath sounds normal. No respiratory distress.  GI: Soft. She exhibits no distension. There is no tenderness.  Genitourinary:  Small amt of active bleeding  from cervical os.  No CMT. No adnexal tenderness Cannot appreciate fundus/adnexal mass - poor exam secondary to body habitus  Musculoskeletal: Normal range of motion.  Neurological: She is alert and oriented to person, place, and time.  Skin: Skin is warm and dry.  Psychiatric: She has a normal mood and affect.    MAU Course  Procedures  MDM Discussed with Dr. Kennon Rounds- 44 year old overweight, african american with elevated BP having continued AUB despite megace therapy.  She advises for Tranexamic acid 1300mg  TID x 5 days.  Followed by appt in clinic.   Agreeable to discharge.    Assessment and Plan  A:  1. Abnormal uterine bleeding (AUB)    P: Discharge to home Rx for Tranexamic acid See PCP asap to then get referral to GYN asap Patient may return to MAU as needed or  if her condition were to change or worsen   Paticia Stack 07/10/2014, 11:05 AM

## 2014-08-16 ENCOUNTER — Encounter (HOSPITAL_BASED_OUTPATIENT_CLINIC_OR_DEPARTMENT_OTHER): Payer: Self-pay | Admitting: Emergency Medicine

## 2014-08-16 ENCOUNTER — Emergency Department (HOSPITAL_BASED_OUTPATIENT_CLINIC_OR_DEPARTMENT_OTHER)
Admission: EM | Admit: 2014-08-16 | Discharge: 2014-08-16 | Disposition: A | Discharge status: Home / Self Care | Payer: Medicaid Other | Attending: Physician Assistant | Admitting: Physician Assistant

## 2014-08-16 DIAGNOSIS — N390 Urinary tract infection, site not specified: Secondary | ICD-10-CM

## 2014-08-16 DIAGNOSIS — Z79899 Other long term (current) drug therapy: Secondary | ICD-10-CM | POA: Insufficient documentation

## 2014-08-16 DIAGNOSIS — D649 Anemia, unspecified: Secondary | ICD-10-CM | POA: Diagnosis not present

## 2014-08-16 DIAGNOSIS — J45909 Unspecified asthma, uncomplicated: Secondary | ICD-10-CM | POA: Insufficient documentation

## 2014-08-16 DIAGNOSIS — R109 Unspecified abdominal pain: Secondary | ICD-10-CM | POA: Diagnosis present

## 2014-08-16 DIAGNOSIS — Z8739 Personal history of other diseases of the musculoskeletal system and connective tissue: Secondary | ICD-10-CM | POA: Diagnosis not present

## 2014-08-16 DIAGNOSIS — Z3202 Encounter for pregnancy test, result negative: Secondary | ICD-10-CM | POA: Insufficient documentation

## 2014-08-16 LAB — URINE MICROSCOPIC-ADD ON

## 2014-08-16 LAB — URINALYSIS, ROUTINE W REFLEX MICROSCOPIC
Bilirubin Urine: NEGATIVE
GLUCOSE, UA: NEGATIVE mg/dL
Hgb urine dipstick: NEGATIVE
KETONES UR: NEGATIVE mg/dL
Nitrite: NEGATIVE
PH: 5.5 (ref 5.0–8.0)
Protein, ur: NEGATIVE mg/dL
Specific Gravity, Urine: 1.021 (ref 1.005–1.030)
Urobilinogen, UA: 0.2 mg/dL (ref 0.0–1.0)

## 2014-08-16 LAB — PREGNANCY, URINE: Preg Test, Ur: NEGATIVE

## 2014-08-16 MED ORDER — PHENAZOPYRIDINE HCL 95 MG PO TABS: 95.0000 mg | ORAL_TABLET | Freq: Three times a day (TID) | ORAL | Status: DC | PRN

## 2014-08-16 MED ORDER — FLUCONAZOLE 50 MG PO TABS
150.0000 mg | ORAL_TABLET | Freq: Once | ORAL | Status: AC
  Administered 2014-08-16: 150 mg via ORAL
  Filled 2014-08-16 (×2): qty 1

## 2014-08-16 MED ORDER — CEPHALEXIN 250 MG PO CAPS
500.0000 mg | ORAL_CAPSULE | Freq: Two times a day (BID) | ORAL | Status: DC
  Administered 2014-08-16: 500 mg via ORAL
  Filled 2014-08-16: qty 2

## 2014-08-16 MED ORDER — CEPHALEXIN 500 MG PO CAPS: 500.0000 mg | ORAL_CAPSULE | Freq: Two times a day (BID) | ORAL | Status: DC

## 2014-08-16 NOTE — ED Notes (Signed)
Patient states that she is having frequent urination and pain in her lower back.

## 2014-08-16 NOTE — ED Provider Notes (Addendum)
[name]: [id number]     Arrival date & time 08/16/14  1313 History   First MD Initiated Contact with Patient 08/16/14 1334     Chief Complaint  Patient presents with  . Flank Pain     (Consider location/radiation/quality/duration/timing/severity/associated sxs/prior Treatment) HPI   Patient is a 44 year old female presenting today with symptoms of urinary tract infection. Patient has pain with urination and feels the urge to go frequently. She got mild  back pain as well. She is able to eat and drink normally. No fevers. Patient had this multiple times in the past. Patient also states she usually gets treated for yeast at the same time. Not sexually active. She's been taking iron pills for anemia and has had to do manual disimpactions  Past Medical History  Diagnosis Date  . TMJ arthralgia   . Asthma   . Anemia    Past Surgical History  Procedure Laterality Date  . Tubal ligation    . Shoulder surgery     History reviewed. No pertinent family history. Social History  Substance Use Topics  . Smoking status: Never Smoker   . Smokeless tobacco: None  . Alcohol Use: No   OB History    Gravida Para Term Preterm AB TAB SAB Ectopic Multiple Living   2 2 2       2      Review of Systems  Constitutional: Negative for activity change.  Respiratory: Negative for shortness of breath.   Cardiovascular: Negative for chest pain.  Gastrointestinal: Negative for abdominal pain.  Genitourinary: Positive for dysuria, frequency and flank pain.      Allergies  Review of patient's allergies indicates no known allergies.  Home Medications   Prior to Admission medications   Medication Sig Start Date End Date Taking? Authorizing Provider  docusate sodium (COLACE) 100 MG capsule Take 1 capsule (100 mg total) by mouth 2 (two) times daily as needed. Patient not taking: Reported on 07/10/2014 06/28/14   [name], CNM  ferrous sulfate (FERROUSUL) 325 (65 FE) MG tablet Take 1 tablet (325 mg  total) by mouth 2 (two) times daily with a meal. Patient not taking: Reported on 07/10/2014 06/28/14   [name], CNM  ipratropium (ATROVENT) 0.06 % nasal spray Place 2 sprays into both nostrils 4 (four) times daily. Patient not taking: Reported on 07/10/2014 01/27/13   Lutricia Feil, PA  Multiple Vitamin (MULTIVITAMIN WITH MINERALS) TABS tablet Take 1 tablet by mouth daily.    Historical Provider, MD  tranexamic acid (LYSTEDA) 650 MG TABS tablet Take 2 tablets (1,300 mg total) by mouth 3 (three) times daily. 07/10/14   [name], PA-C   BP 137/80 mmHg  Pulse 76  Temp(Src) 99.5 F (37.5 C) (Oral)  Resp 18  Ht 5\' 7"  (1.702 m)  Wt 290 lb (131.543 kg)  BMI 45.41 kg/m2  SpO2 100% Physical Exam  Constitutional: She is oriented to person, place, and time. She appears well-developed and well-nourished.  HENT:  Head: Normocephalic and atraumatic.  Eyes: Right eye exhibits no discharge.  Cardiovascular: Normal rate and normal heart sounds.   No murmur heard. Pulmonary/Chest: Effort normal and breath sounds normal.  Abdominal: Soft. She exhibits no distension. There is no tenderness.  Neurological: She is oriented to person, place, and time.  Skin: Skin is warm and dry. She is not diaphoretic.  Psychiatric: She has a normal mood and affect.  Nursing note and vitals reviewed.   ED Course  Procedures (including critical  care time) Labs Review Labs Reviewed  PREGNANCY, URINE  URINALYSIS, ROUTINE W REFLEX MICROSCOPIC (NOT AT [facility])    Imaging Review No results found. I, Beaulieu, personally reviewed and evaluated these images and lab results as part of my medical decision-making.   EKG Interpretation None      MDM   Final diagnoses:  None   patient is an otherwise healthy 44 year old female presenting with symptoms of urinary tract infection. We will check her urine today. Patient eating drinking normally so shel willl be able to take antibiotics at  home. We'll give her Diflucan as well per patient request.  Serapio Edelson Julio Alm, MD 08/16/14 [place], MD 08/16/14 [place], MD 11/05/14 0745

## 2014-09-06 ENCOUNTER — Emergency Department (HOSPITAL_COMMUNITY)
Admission: EM | Admit: 2014-09-06 | Discharge: 2014-09-06 | Disposition: A | Discharge status: Home / Self Care | Payer: No Typology Code available for payment source | Attending: Emergency Medicine | Admitting: Emergency Medicine

## 2014-09-06 ENCOUNTER — Encounter (HOSPITAL_COMMUNITY): Payer: Self-pay | Admitting: *Deleted

## 2014-09-06 DIAGNOSIS — Y998 Other external cause status: Secondary | ICD-10-CM | POA: Insufficient documentation

## 2014-09-06 DIAGNOSIS — Y9389 Activity, other specified: Secondary | ICD-10-CM | POA: Insufficient documentation

## 2014-09-06 DIAGNOSIS — Y92410 Unspecified street and highway as the place of occurrence of the external cause: Secondary | ICD-10-CM | POA: Diagnosis not present

## 2014-09-06 DIAGNOSIS — S0990XA Unspecified injury of head, initial encounter: Secondary | ICD-10-CM | POA: Diagnosis not present

## 2014-09-06 DIAGNOSIS — J45909 Unspecified asthma, uncomplicated: Secondary | ICD-10-CM | POA: Insufficient documentation

## 2014-09-06 DIAGNOSIS — D649 Anemia, unspecified: Secondary | ICD-10-CM | POA: Diagnosis not present

## 2014-09-06 DIAGNOSIS — Z8739 Personal history of other diseases of the musculoskeletal system and connective tissue: Secondary | ICD-10-CM | POA: Insufficient documentation

## 2014-09-06 DIAGNOSIS — Z79899 Other long term (current) drug therapy: Secondary | ICD-10-CM | POA: Insufficient documentation

## 2014-09-06 DIAGNOSIS — S3992XA Unspecified injury of lower back, initial encounter: Secondary | ICD-10-CM | POA: Diagnosis not present

## 2014-09-06 DIAGNOSIS — M5442 Lumbago with sciatica, left side: Secondary | ICD-10-CM

## 2014-09-06 MED ORDER — METHOCARBAMOL 500 MG PO TABS: 500.0000 mg | ORAL_TABLET | Freq: Two times a day (BID) | ORAL | Status: DC

## 2014-09-06 MED ORDER — HYDROCODONE-ACETAMINOPHEN 5-325 MG PO TABS: 2.0000 | ORAL_TABLET | ORAL | Status: DC | PRN

## 2014-09-06 MED ORDER — DIAZEPAM 5 MG PO TABS
5.0000 mg | ORAL_TABLET | Freq: Once | ORAL | Status: AC
  Administered 2014-09-06: 5 mg via ORAL
  Filled 2014-09-06: qty 1

## 2014-09-06 MED ORDER — IBUPROFEN 400 MG PO TABS
800.0000 mg | ORAL_TABLET | Freq: Once | ORAL | Status: AC
  Administered 2014-09-06: 800 mg via ORAL
  Filled 2014-09-06: qty 2

## 2014-09-06 MED ORDER — IBUPROFEN 800 MG PO TABS: 800.0000 mg | ORAL_TABLET | Freq: Three times a day (TID) | ORAL | Status: DC
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
## 2014-09-06 NOTE — ED Notes (Signed)
Declined wheel chair. Pt wants to walk.
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
## 2014-09-06 NOTE — Discharge Instructions (Signed)
Sciatica Sciatica is pain, weakness, numbness, or tingling along the path of the sciatic nerve. The nerve starts in the lower back and runs down the back of each leg. The nerve controls the muscles in the lower leg and in the back of the knee, while also providing sensation to the back of the thigh, lower leg, and the sole of your foot. Sciatica is a symptom of another medical condition. For instance, nerve damage or certain conditions, such as a herniated disk or bone spur on the spine, pinch or put pressure on the sciatic nerve. This causes the pain, weakness, or other sensations normally associated with sciatica. Generally, sciatica only affects one side of the body. CAUSES   Herniated or slipped disc.  Degenerative disk disease.  A pain disorder involving the narrow muscle in the buttocks (piriformis syndrome).  Pelvic injury or fracture.  Pregnancy.  Tumor (rare). SYMPTOMS  Symptoms can vary from mild to very severe. The symptoms usually travel from the low back to the buttocks and down the back of the leg. Symptoms can include:  Mild tingling or dull aches in the lower back, leg, or hip.  Numbness in the back of the calf or sole of the foot.  Burning sensations in the lower back, leg, or hip.  Sharp pains in the lower back, leg, or hip.  Leg weakness.  Severe back pain inhibiting movement. These symptoms may get worse with coughing, sneezing, laughing, or prolonged sitting or standing. Also, being overweight may worsen symptoms. DIAGNOSIS  Your caregiver will perform a physical exam to look for common symptoms of sciatica. He or she may ask you to do certain movements or activities that would trigger sciatic nerve pain. Other tests may be performed to find the cause of the sciatica. These may include:  Blood tests.  X-rays.  Imaging tests, such as an MRI or CT scan. TREATMENT  Treatment is directed at the cause of the sciatic pain. Sometimes, treatment is not necessary  and the pain and discomfort goes away on its own. If treatment is needed, your caregiver may suggest:  Over-the-counter medicines to relieve pain.  Prescription medicines, such as anti-inflammatory medicine, muscle relaxants, or narcotics.  Applying heat or ice to the painful area.  Steroid injections to lessen pain, irritation, and inflammation around the nerve.  Reducing activity during periods of pain.  Exercising and stretching to strengthen your abdomen and improve flexibility of your spine. Your caregiver may suggest losing weight if the extra weight makes the back pain worse.  Physical therapy.  Surgery to eliminate what is pressing or pinching the nerve, such as a bone spur or part of a herniated disk. HOME CARE INSTRUCTIONS   Only take over-the-counter or prescription medicines for pain or discomfort as directed by your caregiver.  Apply ice to the affected area for 20 minutes, 3-4 times a day for the first 48-72 hours. Then try heat in the same way.  Exercise, stretch, or perform your usual activities if these do not aggravate your pain.  Attend physical therapy sessions as directed by your caregiver.  Keep all follow-up appointments as directed by your caregiver.  Do not wear high heels or shoes that do not provide proper support.  Check your mattress to see if it is too soft. A firm mattress may lessen your pain and discomfort. SEEK IMMEDIATE MEDICAL CARE IF:   You lose control of your bowel or bladder (incontinence).  You have increasing weakness in the lower back, pelvis, buttocks,   or legs.  You have redness or swelling of your back.  You have a burning sensation when you urinate.  You have pain that gets worse when you lie down or awakens you at night.  Your pain is worse than you have experienced in the past.  Your pain is lasting longer than 4 weeks.  You are suddenly losing weight without reason. MAKE SURE YOU:  Understand these  instructions.  Will watch your condition.  Will get help right away if you are not doing well or get worse. Document Released: 12/14/2000 Document Revised: 06/21/2011 Document Reviewed: 05/01/2011 ExitCare Patient Information 2015 [employer]. This information is not intended to replace advice given to you by your health care provider. Make sure you discuss any questions you have with your health care provider.  

## 2014-09-10 ENCOUNTER — Other Ambulatory Visit: Payer: Self-pay | Admitting: Chiropractic Medicine

## 2014-09-10 ENCOUNTER — Ambulatory Visit
Admission: RE | Admit: 2014-09-10 | Discharge: 2014-09-10 | Disposition: A | Discharge status: Home / Self Care | Payer: Self-pay | Source: Ambulatory Visit | Attending: Chiropractic Medicine | Admitting: Chiropractic Medicine

## 2014-09-10 DIAGNOSIS — M542 Cervicalgia: Secondary | ICD-10-CM

## 2014-09-10 DIAGNOSIS — M545 Low back pain: Secondary | ICD-10-CM

## 2014-11-03 ENCOUNTER — Encounter: Payer: Self-pay | Admitting: Medical

## 2014-11-03 ENCOUNTER — Other Ambulatory Visit (HOSPITAL_COMMUNITY)
Admission: RE | Admit: 2014-11-03 | Discharge: 2014-11-03 | Disposition: A | Discharge status: Home / Self Care | Payer: Medicaid Other | Source: Ambulatory Visit | Attending: Medical | Admitting: Medical

## 2014-11-03 ENCOUNTER — Ambulatory Visit (INDEPENDENT_AMBULATORY_CARE_PROVIDER_SITE_OTHER): Payer: Medicaid Other | Admitting: Medical

## 2014-11-03 VITALS — BP 138/77 | HR 91 | Ht 66.0 in | Wt 318.5 lb

## 2014-11-03 DIAGNOSIS — Z1151 Encounter for screening for human papillomavirus (HPV): Secondary | ICD-10-CM | POA: Insufficient documentation

## 2014-11-03 DIAGNOSIS — Z01411 Encounter for gynecological examination (general) (routine) with abnormal findings: Secondary | ICD-10-CM | POA: Insufficient documentation

## 2014-11-03 DIAGNOSIS — Z113 Encounter for screening for infections with a predominantly sexual mode of transmission: Secondary | ICD-10-CM

## 2014-11-03 DIAGNOSIS — Z0000 Encounter for general adult medical examination without abnormal findings: Secondary | ICD-10-CM

## 2014-11-03 DIAGNOSIS — Z01419 Encounter for gynecological examination (general) (routine) without abnormal findings: Secondary | ICD-10-CM | POA: Diagnosis not present

## 2014-11-03 DIAGNOSIS — Z124 Encounter for screening for malignant neoplasm of cervix: Secondary | ICD-10-CM | POA: Diagnosis not present

## 2014-11-03 LAB — POCT URINALYSIS DIP (DEVICE)
BILIRUBIN URINE: NEGATIVE
Glucose, UA: NEGATIVE mg/dL
HGB URINE DIPSTICK: NEGATIVE
KETONES UR: NEGATIVE mg/dL
LEUKOCYTES UA: NEGATIVE
NITRITE: NEGATIVE
PH: 5.5 (ref 5.0–8.0)
Protein, ur: NEGATIVE mg/dL
Specific Gravity, Urine: >=1.03 (ref 1.005–1.030)
Urobilinogen, UA: 0.2 mg/dL (ref 0.0–1.0)

## 2014-11-03 NOTE — Patient Instructions (Signed)
Menorrhagia Menorrhagia is when your menstrual periods are heavy or last longer than usual.  HOME CARE  Only take medicine as told by your doctor.  Take any iron pills as told by your doctor. Heavy bleeding may cause low levels of iron in your body.  Do not take aspirin 1 week before or during your period. Aspirin can make the bleeding worse.  Lie down for a while if you change your tampon or pad more than once in 2 hours. This may help lessen the bleeding.  Eat a healthy diet and foods with iron. These foods include leafy green vegetables, meat, liver, eggs, and whole grain breads and cereals.  Do not try to lose weight. Wait until the heavy bleeding has stopped and your iron level is normal. GET HELP IF:  You soak through a pad or tampon every 1 or 2 hours, and this happens every time you have a period.  You need to use pads and tampons at the same time because you are bleeding so much.  You need to change your pad or tampon during the night.  You have a period that lasts for more than 8 days.  You pass clots bigger than 1 inch (2.5 cm) wide.  You have irregular periods that happen more or less often than once a month.  You feel dizzy or pass out (faint).  You feel very weak or tired.  You feel short of breath or feel your heart is beating too fast when you exercise.  You feel sick to your stomach (nausea) and you throw up (vomit) while you are taking your medicine.   You have watery poop (diarrhea) while you are taking your medicine.  You have any problems that may be related to the medicine you are taking.  GET HELP RIGHT AWAY IF:  You soak through 4 or more pads or tampons in 2 hours.  You have any bleeding while you are pregnant. MAKE SURE YOU:   Understand these instructions.  Will watch your condition.  Will get help right away if you are not doing well or get worse.   This information is not intended to replace advice given to you by your health care  provider. Make sure you discuss any questions you have with your health care provider.   Document Released: 09/29/2007 Document Revised: 08/22/2012 Document Reviewed: 06/21/2012 Elsevier Interactive Patient Education 2016 [employer]. Pap Test WHY AM I HAVING THIS TEST? A pap test is sometimes called a pap smear. It is a screening test that is used to check for signs of cancer of the vagina, cervix, and uterus. The test can also identify the presence of infection or precancerous changes. Your health care provider will likely recommend you have this test done on a regular basis. This test may be done:  Every 3 years, starting at age 85.  Every 5 years, in combination with testing for the presence of human papillomavirus (HPV).  More or less often depending on other medical conditions.  WHAT KIND OF SAMPLE IS TAKEN? Using a small cotton swab, plastic spatula, or brush, your health care provider will collect a sample of cells from the surface of your cervix. Your cervix is the opening to your uterus, also called a womb. Secretions from the cervix and vagina may also be collected. HOW DO I PREPARE FOR THE TEST?  Be aware of where you are in your menstrual cycle. You may be asked to reschedule the test if you are menstruating on  the day of the test.  You may need to reschedule if you have a known vaginal infection on the day of the test.  You may be asked to avoid douching or taking a bath the day before or the day of the test.  Some medicines can cause abnormal test results, such as digitalis and tetracycline. Talk with your health care provider before your test if you take one of these medicines. WHAT DO THE RESULTS MEAN? Abnormal test results may indicate a number of health conditions. These may include:  Cancer. Although pap test results cannot be used to diagnose cancer of the cervix, vagina, or uterus, they may suggest the possibility of cancer. Further tests would be required to  determine if cancer is present.  Sexually transmitted disease.  Fungal infection.  Parasite infection.  Herpes infection.  A condition causing or contributing to infertility. It is your responsibility to obtain your test results. Ask the lab or department performing the test when and how you will get your results. Contact your health care provider to discuss any questions you have about your results.   This information is not intended to replace advice given to you by your health care provider. Make sure you discuss any questions you have with your health care provider.   Document Released: 03/12/2002 Document Revised: 01/10/2014 Document Reviewed: 05/13/2013 Elsevier Interactive Patient Education [employer].

## 2014-11-03 NOTE — Progress Notes (Signed)
Patient ID: Jaime Castaneda, female   DOB: Oct 01, 1970, 44 y.o.   MRN: [id number]  History:  Ms. KEVA DARTY is a 44 y.o. [id number] who presents to clinic today for an annual exam. She was originally referred for menorrhagia. She states that she has regular periods lasting ~ 3 days per month. She states that her bleeding is heavy for 2 days. She denies intermenstrual bleeding. She states only mild occasional cramping with cycles relieved with  Ibuprofen. She denies any discharge or breast concerns today. She states that she has not been sexually active x 2-3 months since separating from her husband. She had her last mammogram last year at [facility] which was normal and plans to follow-up with them this year for her next mammogram. She does state that she was recently treated for a UTI and was told that we could check to see that infection was adequately treated.   The following portions of the patient's history were reviewed and updated as appropriate: allergies, current medications, family history, past medical history, social history, past surgical history and problem list.  Review of Systems:  Other than those mentioned in HPI all ROS negative  Objective:  Physical Exam BP 138/77 mmHg  Pulse 91  Ht 5\' 6"  (1.676 m)  Wt 318 lb 8 oz (144.471 kg)  BMI 51.43 kg/m2  LMP 10/20/2014 (Exact Date) Physical Exam  Constitutional: She is oriented to person, place, and time. She appears well-developed and well-nourished.  HENT:  Head: Normocephalic.  Cardiovascular: Normal rate.   Pulmonary/Chest: Effort normal.  Abdominal: Soft. She exhibits no distension and no mass. There is no tenderness. There is no rebound and no guarding.  Genitourinary: Uterus is not enlarged and not tender. Cervix exhibits no motion tenderness, no discharge and no friability. Right adnexum displays no mass and no tenderness. Left adnexum displays no mass and no tenderness. No erythema or bleeding in the vagina. Vaginal discharge  (scant thin white discharge) found.  Neurological: She is alert and oriented to person, place, and time.  Skin: Skin is warm and dry. No erythema.  Psychiatric: She has a normal mood and affect.   Labs and Imaging Results for orders placed or performed in visit on 11/03/14 (from the past 24 hour(s))  POCT urinalysis dip (device)     Status: None   Collection Time: 11/03/14  1:19 PM  Result Value Ref Range   Glucose, UA NEGATIVE NEGATIVE mg/dL   Bilirubin Urine NEGATIVE NEGATIVE   Ketones, ur NEGATIVE NEGATIVE mg/dL   Specific Gravity, Urine >=1.030 1.005 - 1.030   Hgb urine dipstick NEGATIVE NEGATIVE   pH 5.5 5.0 - 8.0   Protein, ur NEGATIVE NEGATIVE mg/dL   Urobilinogen, UA 0.2 0.0 - 1.0 mg/dL   Nitrite NEGATIVE NEGATIVE   Leukocytes, UA NEGATIVE NEGATIVE     Assessment & Plan:  Assessment: Well women visit with routine GYN exam  Plans: Pap smear, wet prep and UA today Patient will be contacted with any abnormal results Patient declines HIV, RPR and Hepatitis testing today Patient to return to [facility] in 1 year for annual exam or sooner if symptoms were to change or worsen  [name], PA-C 11/03/2014 1:29 PM

## 2014-11-04 ENCOUNTER — Other Ambulatory Visit: Payer: Self-pay | Admitting: Medical

## 2014-11-04 DIAGNOSIS — N760 Acute vaginitis: Principal | ICD-10-CM

## 2014-11-04 DIAGNOSIS — B9689 Other specified bacterial agents as the cause of diseases classified elsewhere: Secondary | ICD-10-CM

## 2014-11-04 LAB — WET PREP, GENITAL
TRICH WET PREP: NONE SEEN
Yeast Wet Prep HPF POC: NONE SEEN

## 2014-11-04 MED ORDER — METRONIDAZOLE 500 MG PO TABS: 500.0000 mg | ORAL_TABLET | Freq: Two times a day (BID) | ORAL | Status: DC

## 2014-11-05 ENCOUNTER — Telehealth: Payer: Self-pay | Admitting: *Deleted

## 2014-11-05 LAB — CYTOLOGY - PAP

## 2014-11-05 NOTE — Telephone Encounter (Signed)
Per Kerry Hough, PA, pt has BV and Rx has been sent to her pharmacy. I called pt and informed her of this information. She voiced understanding and had no questions.

## 2014-12-11 ENCOUNTER — Encounter: Payer: Self-pay | Admitting: Podiatry

## 2014-12-11 ENCOUNTER — Ambulatory Visit (INDEPENDENT_AMBULATORY_CARE_PROVIDER_SITE_OTHER): Payer: Medicaid Other | Admitting: Podiatry

## 2014-12-11 VITALS — BP 123/80 | HR 80 | Resp 12

## 2014-12-11 DIAGNOSIS — M7750 Other enthesopathy of unspecified foot: Secondary | ICD-10-CM

## 2014-12-11 DIAGNOSIS — L84 Corns and callosities: Secondary | ICD-10-CM

## 2014-12-11 DIAGNOSIS — M779 Enthesopathy, unspecified: Secondary | ICD-10-CM

## 2014-12-11 MED ORDER — TRIAMCINOLONE ACETONIDE 10 MG/ML IJ SUSP
10.0000 mg | Freq: Once | INTRAMUSCULAR | Status: AC
  Administered 2014-12-11: 10 mg

## 2014-12-11 NOTE — Progress Notes (Signed)
   Subjective:    Patient ID: Jaime Castaneda, female    DOB: April 14, 1970, 44 y.o.   MRN: [id number]  HPI  PT STATED RT FOOT HAVE HARD SKIN/PAINFUL FOR 6 MONTHS. FOOT IS GETTING WORSE WHEN PUTTING PRESSURE ON IT. TRIED TO PUT NEOSPORIN-NO RELIEF  Review of Systems  Constitutional: Positive for fatigue and unexpected weight change.  Respiratory: Positive for cough and wheezing.   Neurological: Positive for light-headedness.       Objective:   Physical Exam        Assessment & Plan:

## 2014-12-14 NOTE — Progress Notes (Signed)
Subjective:     Patient ID: Jaime Castaneda, female   DOB: May 24, 1970, 44 y.o.   MRN: [id number]  HPI patient presents with pain underneath the right foot of approximate 6 months duration. States that she's tried to pad it without relief and feels like there is fluid buildup in the area   Review of Systems  All other systems reviewed and are negative.      Objective:   Physical Exam  Constitutional: She is oriented to person, place, and time.  Cardiovascular: Intact distal pulses.   Musculoskeletal: Normal range of motion.  Neurological: She is oriented to person, place, and time.  Skin: Skin is warm and dry.  Nursing note and vitals reviewed.  neurovascular status was found to be intact with muscle strength adequate range of motion within normal limits with patient noted to have keratotic lesion underneath the right foot of approximate 6 months duration. There is fluid buildup around the second metatarsophalangeal joint associated with this and patient has difficulty walking     Assessment:     Laboratory keratotic lesion with fluid buildup in the sub-plantar capsular region    Plan:     H&P condition reviewed an x-ray reviewed. Today careful injection administered to the plantar capsule 3 mg [place] some Kenalog 5 mg Xylocaine debridement of bleeding and padding accomplished. If symptoms persist patient be seen back and may require more aggressive procedure

## 2015-03-21 ENCOUNTER — Emergency Department (HOSPITAL_BASED_OUTPATIENT_CLINIC_OR_DEPARTMENT_OTHER): Payer: Medicaid Other

## 2015-03-21 ENCOUNTER — Encounter (HOSPITAL_BASED_OUTPATIENT_CLINIC_OR_DEPARTMENT_OTHER): Payer: Self-pay | Admitting: Emergency Medicine

## 2015-03-21 DIAGNOSIS — Z792 Long term (current) use of antibiotics: Secondary | ICD-10-CM | POA: Diagnosis not present

## 2015-03-21 DIAGNOSIS — J069 Acute upper respiratory infection, unspecified: Secondary | ICD-10-CM | POA: Insufficient documentation

## 2015-03-21 DIAGNOSIS — Z791 Long term (current) use of non-steroidal anti-inflammatories (NSAID): Secondary | ICD-10-CM | POA: Insufficient documentation

## 2015-03-21 DIAGNOSIS — R05 Cough: Secondary | ICD-10-CM | POA: Diagnosis present

## 2015-03-21 DIAGNOSIS — Z79899 Other long term (current) drug therapy: Secondary | ICD-10-CM | POA: Insufficient documentation

## 2015-03-21 DIAGNOSIS — Z7984 Long term (current) use of oral hypoglycemic drugs: Secondary | ICD-10-CM | POA: Insufficient documentation

## 2015-03-21 DIAGNOSIS — J45909 Unspecified asthma, uncomplicated: Secondary | ICD-10-CM | POA: Insufficient documentation

## 2015-03-21 DIAGNOSIS — M791 Myalgia: Secondary | ICD-10-CM | POA: Insufficient documentation

## 2015-03-21 DIAGNOSIS — D649 Anemia, unspecified: Secondary | ICD-10-CM | POA: Diagnosis not present

## 2015-03-21 NOTE — ED Notes (Signed)
Pt in c/o cough, aches, chills onset a week ago. No treatment PTA.

## 2015-03-22 ENCOUNTER — Encounter (HOSPITAL_BASED_OUTPATIENT_CLINIC_OR_DEPARTMENT_OTHER): Payer: Self-pay | Admitting: Emergency Medicine

## 2015-03-22 ENCOUNTER — Emergency Department (HOSPITAL_BASED_OUTPATIENT_CLINIC_OR_DEPARTMENT_OTHER)
Admission: EM | Admit: 2015-03-22 | Discharge: 2015-03-22 | Disposition: A | Discharge status: Home / Self Care | Payer: Medicaid Other | Attending: Emergency Medicine | Admitting: Emergency Medicine

## 2015-03-22 DIAGNOSIS — J069 Acute upper respiratory infection, unspecified: Secondary | ICD-10-CM

## 2015-03-22 MED ORDER — FLUTICASONE PROPIONATE 50 MCG/ACT NA SUSP: 2.0000 | Freq: Every day | NASAL | Status: DC

## 2015-03-22 MED ORDER — BENZONATATE 100 MG PO CAPS: 100.0000 mg | ORAL_CAPSULE | Freq: Three times a day (TID) | ORAL | Status: DC

## 2015-03-22 MED ORDER — NAPROXEN 500 MG PO TABS: 500.0000 mg | ORAL_TABLET | Freq: Two times a day (BID) | ORAL | Status: DC

## 2015-03-22 MED ORDER — NAPROXEN 250 MG PO TABS
500.0000 mg | ORAL_TABLET | Freq: Once | ORAL | Status: AC
  Administered 2015-03-22: 500 mg via ORAL
  Filled 2015-03-22: qty 2

## 2015-03-22 MED ORDER — BENZONATATE 100 MG PO CAPS
200.0000 mg | ORAL_CAPSULE | Freq: Once | ORAL | Status: AC
  Administered 2015-03-22: 200 mg via ORAL
  Filled 2015-03-22: qty 2

## 2015-03-22 NOTE — ED Provider Notes (Signed)
[name]: [id number]     Arrival date & time 03/21/15  2142 History  By signing my name below, I, Jaime Castaneda, attest that this documentation has been prepared under the direction and in the presence of [name], MD. Electronically Signed: [name], ED Scribe. 03/22/2015. 12:59 AM.   Chief Complaint  Patient presents with  . Cough  . Generalized Body Aches    Patient is a 45 y.o. female presenting with URI. The history is provided by the patient. No language interpreter was used.  URI Presenting symptoms: congestion, cough and rhinorrhea   Presenting symptoms: no fever   Severity:  Mild Onset quality:  Gradual Timing:  Constant Progression:  Unchanged Chronicity:  New Relieved by:  Nothing Worsened by:  Nothing tried Ineffective treatments:  None tried Associated symptoms: myalgias   Associated symptoms: no neck pain   Risk factors: not elderly    HPI Comments: Jaime Castaneda is a 45 y.o. female who presents to the Emergency Department complaining of an intermittent, mild cough onset 1 week ago. Patient reports that her son became ill with similar symptoms 1 week ago and then she has been caring for him. She endorses associated nasal congestion, rhinorrhea, and post-nasal drip. She has not been seen for these symptoms. Denies fever.  Past Medical History  Diagnosis Date  . TMJ arthralgia   . Asthma   . Anemia    Past Surgical History  Procedure Laterality Date  . Tubal ligation    . Shoulder surgery     History reviewed. No pertinent family history. Social History  Substance Use Topics  . Smoking status: Never Smoker   . Smokeless tobacco: Never Used  . Alcohol Use: No   OB History    Gravida Para Term Preterm AB TAB SAB Ectopic Multiple Living   2 2 2       2      Review of Systems  Constitutional: Negative for fever.  HENT: Positive for congestion, postnasal drip and rhinorrhea.   Respiratory: Positive for cough.   Musculoskeletal: Positive  for myalgias. Negative for neck pain.  All other systems reviewed and are negative.     Allergies  Cephalexin and Moxifloxacin  Home Medications   Prior to Admission medications   Medication Sig Start Date End Date Taking? Authorizing Provider  cephALEXin (KEFLEX) 500 MG capsule Take 1 capsule (500 mg total) by mouth 2 (two) times daily. 08/16/14   [name], MD  docusate sodium (COLACE) 100 MG capsule Take 1 capsule (100 mg total) by mouth 2 (two) times daily as needed. 06/28/14   [name], CNM  ferrous sulfate (FERROUSUL) 325 (65 FE) MG tablet Take 1 tablet (325 mg total) by mouth 2 (two) times daily with a meal. 06/28/14   [name], CNM  ferrous sulfate (FERROUSUL) 325 (65 FE) MG tablet Take 325 mg by mouth. 06/28/14   Historical Provider, MD  HYDROcodone-acetaminophen (NORCO/VICODIN) 5-325 MG per tablet Take 2 tablets by mouth every 4 (four) hours as needed. 09/06/14   [name], PA-C  ibuprofen (ADVIL,MOTRIN) 800 MG tablet Take 1 tablet (800 mg total) by mouth 3 (three) times daily. 09/06/14   [name], PA-C  ipratropium (ATROVENT) 0.06 % nasal spray Place 2 sprays into both nostrils 4 (four) times daily. 01/27/13   Audelia Hives Presson, PA  metFORMIN (GLUCOPHAGE-XR) 500 MG 24 hr tablet Take 500 mg by mouth. 12/03/14 12/03/15  Historical Provider, MD  metFORMIN (GLUCOPHAGE-XR) 500 MG 24 hr tablet  12/03/14   Historical Provider, MD  methocarbamol (ROBAXIN) 500 MG tablet Take 1 tablet (500 mg total) by mouth 2 (two) times daily. 09/06/14   [name], PA-C  methocarbamol (ROBAXIN) 500 MG tablet Take 500 mg by mouth. 09/06/14   Historical Provider, MD  metroNIDAZOLE (FLAGYL) 500 MG tablet Take 1 tablet (500 mg total) by mouth 2 (two) times daily. 11/04/14   [name], PA-C  Multiple Vitamin (MULTIVITAMIN WITH MINERALS) TABS tablet Take 1 tablet by mouth daily.    Historical Provider, MD  nitrofurantoin, macrocrystal-monohydrate, (MACROBID) 100 MG capsule  12/01/14    Historical Provider, MD  phenazopyridine (PYRIDIUM) 95 MG tablet Take 1 tablet (95 mg total) by mouth 3 (three) times daily as needed for pain. 08/16/14   [name], MD  tranexamic acid (LYSTEDA) 650 MG TABS tablet Take 2 tablets (1,300 mg total) by mouth 3 (three) times daily. 07/10/14   [name], PA-C   BP 143/88 mmHg  Pulse 99  Temp(Src) 100.1 F (37.8 C) (Oral)  Resp 20  Ht 5\' 7"  (1.702 m)  Wt 320 lb (145.151 kg)  BMI 50.11 kg/m2  SpO2 100%  LMP  Physical Exam  Constitutional: She is oriented to person, place, and time. She appears well-developed and well-nourished. No distress.  HENT:  Head: Normocephalic and atraumatic.  Right Ear: Tympanic membrane normal.  Left Ear: Tympanic membrane normal.  Nose: Rhinorrhea present.  Eyes: Conjunctivae and EOM are normal.  Neck: Neck supple. No tracheal deviation present.  Cardiovascular: Normal rate and regular rhythm.   Pulmonary/Chest: Effort normal. No respiratory distress.  Lungs CTA.  Abdominal: Soft. Bowel sounds are normal.  Musculoskeletal: Normal range of motion.  Neurological: She is alert and oriented to person, place, and time. She has normal reflexes.  Skin: Skin is warm and dry.  Psychiatric: She has a normal mood and affect. Her behavior is normal.  Nursing note and vitals reviewed.   ED Course  Procedures   DIAGNOSTIC STUDIES: Oxygen Saturation is 100% on RA, normal by my interpretation.    COORDINATION OF CARE: 12:51 AM Discussed treatment plan with pt at bedside and pt agreed to plan.   Labs Review Labs Reviewed  PREGNANCY, URINE    Imaging Review Dg Chest 2 View  03/21/2015  CLINICAL DATA:  Acute onset of cough, aches and chills. Initial encounter. EXAM: CHEST  2 VIEW COMPARISON:  Chest radiograph performed 01/30/2013 FINDINGS: The lungs are well-aerated and clear. There is no evidence of focal opacification, pleural effusion or pneumothorax. The heart is normal in size; the  mediastinal contour is within normal limits. No acute osseous abnormalities are seen. IMPRESSION: No acute cardiopulmonary process seen. Electronically Signed   By: [name] M.D.   On: 03/21/2015 22:51   I have personally reviewed and evaluated these images and lab results as part of my medical decision-making.   EKG Interpretation None      MDM   Final diagnoses:  None    URI will treat symptomatically  I personally performed the services described in this documentation, which was scribed in my presence. The recorded information has been reviewed and is accurate.      Veatrice Kells, MD 03/22/15 2253

## 2015-03-22 NOTE — Discharge Instructions (Signed)
Cool Mist Vaporizers °Vaporizers may help relieve the symptoms of a cough and cold. They add moisture to the air, which helps mucus to become thinner and less sticky. This makes it easier to breathe and cough up secretions. Cool mist vaporizers do not cause serious [place] like hot mist vaporizers, which may also be called steamers or humidifiers. Vaporizers have not been proven to help with colds. You should not use a vaporizer if you are allergic to mold. °HOME CARE INSTRUCTIONS °· Follow the package instructions for the vaporizer. °· Do not use anything other than distilled water in the vaporizer. °· Do not run the vaporizer all of the time. This can cause mold or bacteria to grow in the vaporizer. °· Clean the vaporizer after each time it is used. °· Clean and dry the vaporizer well before storing it. °· Stop using the vaporizer if worsening respiratory symptoms develop. °  °This information is not intended to replace advice given to you by your health care provider. Make sure you discuss any questions you have with your health care provider. °  °Document Released: 09/17/2003 Document Revised: 12/25/2012 Document Reviewed: 05/09/2012 °Elsevier Interactive Patient Education ©2016 [employer] ° °Upper Respiratory Infection, Adult °Most upper respiratory infections (URIs) are a viral infection of the air passages leading to the lungs. A URI affects the nose, throat, and upper air passages. The most common type of URI is nasopharyngitis and is typically referred to as "the common cold." °URIs run their course and usually go away on their own. Most of the time, a URI does not require medical attention, but sometimes a bacterial infection in the upper airways can follow a viral infection. This is called a secondary infection. Sinus and middle ear infections are common types of secondary upper respiratory infections. °Bacterial pneumonia can also complicate a URI. A URI can worsen asthma and chronic obstructive  pulmonary disease (COPD). Sometimes, these complications can require emergency medical care and may be life threatening.  °CAUSES °Almost all URIs are caused by viruses. A virus is a type of germ and can spread from one person to another.  °RISKS FACTORS °You may be at risk for a URI if:  °· You smoke.   °· You have chronic heart or lung disease. °· You have a weakened defense (immune) system.   °· You are very young or very old.   °· You have nasal allergies or asthma. °· You work in crowded or poorly ventilated areas. °· You work in health care facilities or schools. °SIGNS AND SYMPTOMS  °Symptoms typically develop 2-3 days after you come in contact with a cold virus. Most viral URIs last 7-10 days. However, viral URIs from the influenza virus (flu virus) can last 14-18 days and are typically more severe. Symptoms may include:  °· Runny or stuffy (congested) nose.   °· Sneezing.   °· Cough.   °· Sore throat.   °· Headache.   °· Fatigue.   °· Fever.   °· Loss of appetite.   °· Pain in your forehead, behind your eyes, and over your cheekbones (sinus pain). °· Muscle aches.   °DIAGNOSIS  °Your health care provider may diagnose a URI by: °· Physical exam. °· Tests to check that your symptoms are not due to another condition such as: °¨ Strep throat. °¨ Sinusitis. °¨ Pneumonia. °¨ Asthma. °TREATMENT  °A URI goes away on its own with time. It cannot be cured with medicines, but medicines may be prescribed or recommended to relieve symptoms. Medicines may help: °· Reduce your fever. °· Reduce   your cough. °· Relieve nasal congestion. °HOME CARE INSTRUCTIONS  °· Take medicines only as directed by your health care provider.   °· Gargle warm saltwater or take cough drops to comfort your throat as directed by your health care provider. °· Use a warm mist humidifier or inhale steam from a shower to increase air moisture. This may make it easier to breathe. °· Drink enough fluid to keep your urine clear or pale yellow.   °· Eat  soups and other clear broths and maintain good nutrition.   °· Rest as needed.   °· Return to work when your temperature has returned to normal or as your health care provider advises. You may need to stay home longer to avoid infecting others. You can also use a face mask and careful hand washing to prevent spread of the virus. °· Increase the usage of your inhaler if you have asthma.   °· Do not use any tobacco products, including cigarettes, chewing tobacco, or electronic cigarettes. If you need help quitting, ask your health care provider. °PREVENTION  °The best way to protect yourself from getting a cold is to practice good hygiene.  °· Avoid oral or hand contact with people with cold symptoms.   °· Wash your hands often if contact occurs.   °There is no clear evidence that vitamin C, vitamin E, echinacea, or exercise reduces the chance of developing a cold. However, it is always recommended to get plenty of rest, exercise, and practice good nutrition.  °SEEK MEDICAL CARE IF:  °· You are getting worse rather than better.   °· Your symptoms are not controlled by medicine.   °· You have chills. °· You have worsening shortness of breath. °· You have brown or red mucus. °· You have yellow or brown nasal discharge. °· You have pain in your face, especially when you bend forward. °· You have a fever. °· You have swollen neck glands. °· You have pain while swallowing. °· You have white areas in the back of your throat. °SEEK IMMEDIATE MEDICAL CARE IF:  °· You have severe or persistent: °¨ Headache. °¨ Ear pain. °¨ Sinus pain. °¨ Chest pain. °· You have chronic lung disease and any of the following: °¨ Wheezing. °¨ Prolonged cough. °¨ Coughing up blood. °¨ A change in your usual mucus. °· You have a stiff neck. °· You have changes in your: °¨ Vision. °¨ Hearing. °¨ Thinking. °¨ Mood. °MAKE SURE YOU:  °· Understand these instructions. °· Will watch your condition. °· Will get help right away if you are not doing well or  get worse. °  °This information is not intended to replace advice given to you by your health care provider. Make sure you discuss any questions you have with your health care provider. °  °Document Released: 06/15/2000 Document Revised: 05/06/2014 Document Reviewed: 03/27/2013 °Elsevier Interactive Patient Education ©2016 Elsevier Inc. ° °

## 2015-04-13 DIAGNOSIS — J4520 Mild intermittent asthma, uncomplicated: Secondary | ICD-10-CM | POA: Insufficient documentation

## 2015-05-17 ENCOUNTER — Emergency Department (HOSPITAL_BASED_OUTPATIENT_CLINIC_OR_DEPARTMENT_OTHER)
Admission: EM | Admit: 2015-05-17 | Discharge: 2015-05-18 | Disposition: A | Discharge status: Home / Self Care | Payer: Medicaid Other | Attending: Emergency Medicine | Admitting: Emergency Medicine

## 2015-05-17 ENCOUNTER — Encounter (HOSPITAL_BASED_OUTPATIENT_CLINIC_OR_DEPARTMENT_OTHER): Payer: Self-pay | Admitting: Emergency Medicine

## 2015-05-17 DIAGNOSIS — L03116 Cellulitis of left lower limb: Secondary | ICD-10-CM | POA: Diagnosis not present

## 2015-05-17 DIAGNOSIS — J45909 Unspecified asthma, uncomplicated: Secondary | ICD-10-CM | POA: Diagnosis not present

## 2015-05-17 DIAGNOSIS — E669 Obesity, unspecified: Secondary | ICD-10-CM | POA: Insufficient documentation

## 2015-05-17 DIAGNOSIS — L02416 Cutaneous abscess of left lower limb: Secondary | ICD-10-CM | POA: Diagnosis present

## 2015-05-17 HISTORY — DX: Obesity, unspecified: E66.9

## 2015-05-17 MED ORDER — SULFAMETHOXAZOLE-TRIMETHOPRIM 800-160 MG PO TABS
1.0000 | ORAL_TABLET | Freq: Once | ORAL | Status: AC
  Administered 2015-05-17: 1 via ORAL
  Filled 2015-05-17: qty 1

## 2015-05-17 MED ORDER — ACETAMINOPHEN 325 MG PO TABS
650.0000 mg | ORAL_TABLET | Freq: Once | ORAL | Status: AC
  Administered 2015-05-17: 650 mg via ORAL
  Filled 2015-05-17: qty 2

## 2015-05-17 MED ORDER — LIDOCAINE-EPINEPHRINE (PF) 2 %-1:200000 IJ SOLN
20.0000 mL | Freq: Once | INTRAMUSCULAR | Status: AC
  Administered 2015-05-17: 20 mL
  Filled 2015-05-17: qty 20

## 2015-05-17 NOTE — ED Provider Notes (Signed)
[name]: [id number]     Arrival date & time 05/17/15  2154 History  By signing my name below, I, Stephania Fragmin, attest that this documentation has been prepared under the direction and in the presence of Jola Schmidt, MD. Electronically Signed: Stephania Fragmin, ED Scribe. 05/17/2015. 12:25 AM.    Chief Complaint  Patient presents with  . Insect Bite   The history is provided by the patient. No language interpreter was used.    HPI Comments: Jaime Castaneda is a 45 y.o. female with a history of obesity, who presents to the Emergency Department complaining of a gradual-onset, constant, gradually worsening area of pain, redness, and swelling on her anterior left thigh that she first noticed when she woke up 4 days ago. She also complains of associated chills last night. Patient notes she did have an axillary abscess once in the past, which was successfully resolved after an I&D procedure, but she has never had an abscess in the area. Patient states she has been taking ibuprofen with moderate relief. She notes she has also been applying warm compresses with no relief.She denies a history of DM. She denies any known fever. Patient has known allergies to Keflex and Moxifloxacin.   PCP: Dr. Shirlee More at [place]   Past Medical History  Diagnosis Date  . TMJ arthralgia   . Asthma   . Anemia   . Obesity    Past Surgical History  Procedure Laterality Date  . Tubal ligation    . Shoulder surgery     No family history on file. Social History  Substance Use Topics  . Smoking status: Never Smoker   . Smokeless tobacco: Never Used  . Alcohol Use: No   OB History    Gravida Para Term Preterm AB TAB SAB Ectopic Multiple Living   2 2 2       2      Review of Systems  Constitutional: Positive for chills. Negative for fever.  Skin: Positive for color change (area of pain, redness, and swelling on left anterior thigh).  All other systems reviewed and are  negative.   Allergies  Cephalexin and Moxifloxacin  Home Medications   Prior to Admission medications   Medication Sig Start Date End Date Taking? Authorizing Provider  benzonatate (TESSALON) 100 MG capsule Take 1 capsule (100 mg total) by mouth every 8 (eight) hours. 03/22/15   April Palumbo, MD  cephALEXin (KEFLEX) 500 MG capsule Take 1 capsule (500 mg total) by mouth 2 (two) times daily. 08/16/14   [name], MD  docusate sodium (COLACE) 100 MG capsule Take 1 capsule (100 mg total) by mouth 2 (two) times daily as needed. 06/28/14   [name], CNM  ferrous sulfate (FERROUSUL) 325 (65 FE) MG tablet Take 1 tablet (325 mg total) by mouth 2 (two) times daily with a meal. 06/28/14   [name], CNM  ferrous sulfate (FERROUSUL) 325 (65 FE) MG tablet Take 325 mg by mouth. 06/28/14   Historical Provider, MD  fluticasone (FLONASE) 50 MCG/ACT nasal spray Place 2 sprays into both nostrils daily. 03/22/15   April Palumbo, MD  HYDROcodone-acetaminophen (NORCO/VICODIN) 5-325 MG per tablet Take 2 tablets by mouth every 4 (four) hours as needed. 09/06/14   [name], PA-C  ibuprofen (ADVIL,MOTRIN) 800 MG tablet Take 1 tablet (800 mg total) by mouth 3 (three) times daily. 09/06/14   [name], PA-C  ipratropium (ATROVENT) 0.06 % nasal spray Place 2 sprays into both  nostrils 4 (four) times daily. 01/27/13   Audelia Hives Presson, PA  metFORMIN (GLUCOPHAGE-XR) 500 MG 24 hr tablet Take 500 mg by mouth. 12/03/14 12/03/15  Historical Provider, MD  metFORMIN (GLUCOPHAGE-XR) 500 MG 24 hr tablet  12/03/14   Historical Provider, MD  methocarbamol (ROBAXIN) 500 MG tablet Take 1 tablet (500 mg total) by mouth 2 (two) times daily. 09/06/14   [name], PA-C  methocarbamol (ROBAXIN) 500 MG tablet Take 500 mg by mouth. 09/06/14   Historical Provider, MD  metroNIDAZOLE (FLAGYL) 500 MG tablet Take 1 tablet (500 mg total) by mouth 2 (two) times daily. 11/04/14   [name], PA-C  Multiple Vitamin  (MULTIVITAMIN WITH MINERALS) TABS tablet Take 1 tablet by mouth daily.    Historical Provider, MD  naproxen (NAPROSYN) 500 MG tablet Take 1 tablet (500 mg total) by mouth 2 (two) times daily. 03/22/15   April Palumbo, MD  nitrofurantoin, macrocrystal-monohydrate, (MACROBID) 100 MG capsule  12/01/14   Historical Provider, MD  phenazopyridine (PYRIDIUM) 95 MG tablet Take 1 tablet (95 mg total) by mouth 3 (three) times daily as needed for pain. 08/16/14   [name], MD  tranexamic acid (LYSTEDA) 650 MG TABS tablet Take 2 tablets (1,300 mg total) by mouth 3 (three) times daily. 07/10/14   [name], PA-C   BP 173/99 mmHg  Pulse 94  Temp(Src) 98.8 F (37.1 C) (Oral)  Resp 16  Ht 5\' 7"  (1.702 m)  Wt 300 lb (136.079 kg)  BMI 46.98 kg/m2  SpO2 100%  LMP 04/23/2015 (Approximate) Physical Exam  Constitutional: She is oriented to person, place, and time. She appears well-developed and well-nourished.  HENT:  Head: Normocephalic.  Eyes: EOM are normal.  Neck: Normal range of motion.  Pulmonary/Chest: Effort normal.  Abdominal: She exhibits no distension.  Musculoskeletal: Normal range of motion.  Neurological: She is alert and oriented to person, place, and time.  Skin:  On the anterior left thigh, there is an area of fluctuance and induration, with surrounding erythema. No obvious drainage at this time.   Psychiatric: She has a normal mood and affect.  Nursing note and vitals reviewed.   ED Course  Procedures (including critical care time)  DIAGNOSTIC STUDIES: Oxygen Saturation is 100% on RA, normal by my interpretation.    COORDINATION OF CARE: 11:17 PM - Discussed treatment plan with pt at bedside which includes Bactrim and Tylenol administered here, and I&D procedure. Pt verbalized understanding and agreed to plan.    +++++++++++++++++++++++++++++++++++++++++++++++++++++++++   INCISION AND DRAINAGE PROCEDURE NOTE: Patient identification was confirmed and  verbal consent was obtained. This procedure was performed by Jola Schmidt, MD at 12:04 AM. Site: Anterior left thigh Sterile procedures observed Needle size: 27 Anesthetic used (type and amt): Lidocaine 2% w/ epi, 8 mL Blade size: 11 Drainage: moderate amount of purulent drainage Complexity: Complex Site anesthetized, incision made over site, wound drained and explored loculations, rinsed with copious amounts of normal saline, wound packed with sterile gauze, covered with dry, sterile dressing.  Pt tolerated procedure well without complications.  Instructions for care discussed verbally and pt provided with additional written instructions for homecare and f/u.   +++++++++++++++++++++++++++++++++++++++++++++++++++++  MDM   Final diagnoses:  Abscess of left thigh  Cellulitis of left thigh   Patient tolerated the procedure well.  No complications.  Home on antibiotics.  Instructions to return to the ER for new or worsening symptoms.  Incision and drainage performed.  Purulent discharge.  Home on  Bactrim   I personally performed the services described in this documentation, which was scribed in my presence. The recorded information has been reviewed and is accurate.         Jola Schmidt, MD 05/18/15 [phone]

## 2015-05-17 NOTE — ED Notes (Signed)
Insect bite or sting from unknown type of insect 3 days ago on left upper leg.  Area is now swollen, purulent, hard, and red. Pt has tried to drain it but it is looking worse.

## 2015-05-18 MED ORDER — SULFAMETHOXAZOLE-TRIMETHOPRIM 800-160 MG PO TABS
1.0000 | ORAL_TABLET | Freq: Two times a day (BID) | ORAL | Status: AC
Start: 2015-05-18 — End: 2015-05-25

## 2015-05-18 NOTE — Discharge Instructions (Signed)

## 2015-05-18 NOTE — ED Notes (Signed)
Dr. Venora Maples into room

## 2015-05-21 ENCOUNTER — Emergency Department (HOSPITAL_BASED_OUTPATIENT_CLINIC_OR_DEPARTMENT_OTHER): Payer: Medicaid Other

## 2015-05-21 ENCOUNTER — Encounter (HOSPITAL_BASED_OUTPATIENT_CLINIC_OR_DEPARTMENT_OTHER): Payer: Self-pay | Admitting: *Deleted

## 2015-05-21 ENCOUNTER — Emergency Department (HOSPITAL_BASED_OUTPATIENT_CLINIC_OR_DEPARTMENT_OTHER)
Admission: EM | Admit: 2015-05-21 | Discharge: 2015-05-21 | Disposition: A | Discharge status: Home / Self Care | Payer: Medicaid Other | Attending: Emergency Medicine | Admitting: Emergency Medicine

## 2015-05-21 DIAGNOSIS — H109 Unspecified conjunctivitis: Secondary | ICD-10-CM | POA: Diagnosis not present

## 2015-05-21 DIAGNOSIS — J45909 Unspecified asthma, uncomplicated: Secondary | ICD-10-CM | POA: Diagnosis not present

## 2015-05-21 DIAGNOSIS — G43909 Migraine, unspecified, not intractable, without status migrainosus: Secondary | ICD-10-CM | POA: Diagnosis not present

## 2015-05-21 DIAGNOSIS — F419 Anxiety disorder, unspecified: Secondary | ICD-10-CM | POA: Diagnosis not present

## 2015-05-21 DIAGNOSIS — Z4800 Encounter for change or removal of nonsurgical wound dressing: Secondary | ICD-10-CM | POA: Diagnosis present

## 2015-05-21 DIAGNOSIS — Z5189 Encounter for other specified aftercare: Secondary | ICD-10-CM

## 2015-05-21 DIAGNOSIS — E669 Obesity, unspecified: Secondary | ICD-10-CM | POA: Insufficient documentation

## 2015-05-21 DIAGNOSIS — Z6841 Body Mass Index (BMI) 40.0 and over, adult: Secondary | ICD-10-CM | POA: Diagnosis not present

## 2015-05-21 LAB — BASIC METABOLIC PANEL
ANION GAP: 8 (ref 5–15)
BUN: 8 mg/dL (ref 6–20)
CHLORIDE: 100 mmol/L — AB (ref 101–111)
CO2: 26 mmol/L (ref 22–32)
Calcium: 8.8 mg/dL — ABNORMAL LOW (ref 8.9–10.3)
Creatinine, Ser: 0.88 mg/dL (ref 0.44–1.00)
GFR calc Af Amer: >60 mL/min (ref >60)
Glucose, Bld: 89 mg/dL (ref 65–99)
POTASSIUM: 3.8 mmol/L (ref 3.5–5.1)
SODIUM: 134 mmol/L — AB (ref 135–145)

## 2015-05-21 LAB — CBC WITH DIFFERENTIAL/PLATELET
BASOS ABS: 0 10*3/uL (ref 0.0–0.1)
Basophils Relative: 0 %
EOS ABS: 0 10*3/uL (ref 0.0–0.7)
Eosinophils Relative: 1 %
HCT: 31.3 % — ABNORMAL LOW (ref 36.0–46.0)
HEMOGLOBIN: 9.4 g/dL — AB (ref 12.0–15.0)
LYMPHS ABS: 1.5 10*3/uL (ref 0.7–4.0)
LYMPHS PCT: 30 %
MCH: 22.7 pg — AB (ref 26.0–34.0)
MCHC: 30 g/dL (ref 30.0–36.0)
MCV: 75.4 fL — ABNORMAL LOW (ref 78.0–100.0)
Monocytes Absolute: 0.4 10*3/uL (ref 0.1–1.0)
Monocytes Relative: 8 %
NEUTROS PCT: 61 %
Neutro Abs: 2.9 10*3/uL (ref 1.7–7.7)
Platelets: 391 10*3/uL (ref 150–400)
RBC: 4.15 MIL/uL (ref 3.87–5.11)
RDW: 17.6 % — ABNORMAL HIGH (ref 11.5–15.5)
WBC: 4.8 10*3/uL (ref 4.0–10.5)

## 2015-05-21 MED ORDER — TETRACAINE HCL 0.5 % OP SOLN
1.0000 [drp] | Freq: Once | OPHTHALMIC | Status: AC
  Administered 2015-05-21: 1 [drp] via OPHTHALMIC

## 2015-05-21 MED ORDER — IBUPROFEN 600 MG PO TABS: 600.0000 mg | ORAL_TABLET | Freq: Three times a day (TID) | ORAL | Status: DC | PRN

## 2015-05-21 MED ORDER — SODIUM CHLORIDE 0.9 % IV BOLUS (SEPSIS)
1000.0000 mL | Freq: Once | INTRAVENOUS | Status: AC
Start: 2015-05-21 — End: 2015-05-21
  Administered 2015-05-21: 1000 mL via INTRAVENOUS

## 2015-05-21 MED ORDER — METHYLPREDNISOLONE SODIUM SUCC 125 MG IJ SOLR
125.0000 mg | Freq: Once | INTRAMUSCULAR | Status: AC
Start: 2015-05-21 — End: 2015-05-21
  Administered 2015-05-21: 125 mg via INTRAVENOUS
  Filled 2015-05-21: qty 2

## 2015-05-21 MED ORDER — HYDROXYZINE HCL 25 MG PO TABS
25.0000 mg | ORAL_TABLET | Freq: Once | ORAL | Status: AC
  Administered 2015-05-21: 25 mg via ORAL
  Filled 2015-05-21: qty 1

## 2015-05-21 MED ORDER — KETOROLAC TROMETHAMINE 30 MG/ML IJ SOLN
30.0000 mg | Freq: Once | INTRAMUSCULAR | Status: AC
  Administered 2015-05-21: 30 mg via INTRAVENOUS
  Filled 2015-05-21: qty 1

## 2015-05-21 MED ORDER — ALPRAZOLAM 0.25 MG PO TABS: 0.2500 mg | ORAL_TABLET | Freq: Three times a day (TID) | ORAL | Status: DC | PRN

## 2015-05-21 MED ORDER — FLUORESCEIN SODIUM 1 MG OP STRP
ORAL_STRIP | OPHTHALMIC | Status: AC
  Administered 2015-05-21: 1 via OPHTHALMIC
  Filled 2015-05-21: qty 1

## 2015-05-21 MED ORDER — FLUORESCEIN SODIUM 1 MG OP STRP
1.0000 | ORAL_STRIP | Freq: Once | OPHTHALMIC | Status: AC
  Administered 2015-05-21: 1 via OPHTHALMIC

## 2015-05-21 MED ORDER — METOCLOPRAMIDE HCL 5 MG/ML IJ SOLN
10.0000 mg | Freq: Once | INTRAMUSCULAR | Status: AC
  Administered 2015-05-21: 10 mg via INTRAVENOUS
  Filled 2015-05-21: qty 2

## 2015-05-21 MED ORDER — TETRACAINE HCL 0.5 % OP SOLN
OPHTHALMIC | Status: AC
  Administered 2015-05-21: 1 [drp] via OPHTHALMIC
  Filled 2015-05-21: qty 4

## 2015-05-21 MED ORDER — METOCLOPRAMIDE HCL 10 MG PO TABS: 10.0000 mg | ORAL_TABLET | Freq: Four times a day (QID) | ORAL | Status: DC | PRN

## 2015-05-21 NOTE — ED Notes (Signed)
MD at bedside. 

## 2015-05-21 NOTE — ED Provider Notes (Signed)
[name]: [id number]     Arrival date & time 05/21/15  1140 History   First MD Initiated Contact with Patient 05/21/15 1205     Chief Complaint  Patient presents with  . Wound Check     (Consider location/radiation/quality/duration/timing/severity/associated sxs/prior Treatment) HPI Patient presents with multiple symptoms including eye" spasms", eye redness, eye pain, frontal headache, photophobia, nausea is a week. States she was bitten or stung by an unknown insect to the left upper thigh. Developed abscess which was incised and drained 4 days ago. She's had consistent oozing from the site. She is still taking Bactrim. No fever or chills. No neck pain or stiffness. No focal weakness or numbness. Past Medical History  Diagnosis Date  . TMJ arthralgia   . Asthma   . Anemia   . Obesity    Past Surgical History  Procedure Laterality Date  . Tubal ligation    . Shoulder surgery     No family history on file. Social History  Substance Use Topics  . Smoking status: Never Smoker   . Smokeless tobacco: Never Used  . Alcohol Use: No   OB History    Gravida Para Term Preterm AB TAB SAB Ectopic Multiple Living   2 2 2       2      Review of Systems  Constitutional: Positive for diaphoresis. Negative for fever and chills.  HENT: Positive for sinus pressure.   Eyes: Positive for photophobia, pain, discharge, redness and visual disturbance.  Respiratory: Negative for cough and shortness of breath.   Cardiovascular: Negative for chest pain.  Gastrointestinal: Negative for vomiting, abdominal pain, diarrhea and constipation.  Genitourinary: Negative for dysuria, flank pain and difficulty urinating.  Musculoskeletal: Negative for myalgias, back pain, neck pain and neck stiffness.  Skin: Positive for wound. Negative for rash.  Neurological: Positive for headaches. Negative for dizziness, syncope, weakness, light-headedness and numbness.  All other systems reviewed and are  negative.     Allergies  Cephalexin and Moxifloxacin  Home Medications   Prior to Admission medications   Medication Sig Start Date End Date Taking? Authorizing Provider  ALPRAZolam (XANAX) 0.25 MG tablet Take 1 tablet (0.25 mg total) by mouth 3 (three) times daily as needed for anxiety. 05/21/15   Julianne Rice, MD  benzonatate (TESSALON) 100 MG capsule Take 1 capsule (100 mg total) by mouth every 8 (eight) hours. 03/22/15   April Palumbo, MD  cephALEXin (KEFLEX) 500 MG capsule Take 1 capsule (500 mg total) by mouth 2 (two) times daily. 08/16/14   [name], MD  docusate sodium (COLACE) 100 MG capsule Take 1 capsule (100 mg total) by mouth 2 (two) times daily as needed. 06/28/14   [name], CNM  ferrous sulfate (FERROUSUL) 325 (65 FE) MG tablet Take 1 tablet (325 mg total) by mouth 2 (two) times daily with a meal. 06/28/14   [name], CNM  ferrous sulfate (FERROUSUL) 325 (65 FE) MG tablet Take 325 mg by mouth. 06/28/14   Historical Provider, MD  fluticasone (FLONASE) 50 MCG/ACT nasal spray Place 2 sprays into both nostrils daily. 03/22/15   April Palumbo, MD  HYDROcodone-acetaminophen (NORCO/VICODIN) 5-325 MG per tablet Take 2 tablets by mouth every 4 (four) hours as needed. 09/06/14   [name], PA-C  ibuprofen (ADVIL,MOTRIN) 600 MG tablet Take 1 tablet (600 mg total) by mouth every 8 (eight) hours as needed for moderate pain. 05/21/15   Julianne Rice, MD  ipratropium (ATROVENT) 0.06 % nasal spray Place 2 sprays  into both nostrils 4 (four) times daily. 01/27/13   Audelia Hives Presson, PA  metFORMIN (GLUCOPHAGE-XR) 500 MG 24 hr tablet Take 500 mg by mouth. 12/03/14 12/03/15  Historical Provider, MD  metFORMIN (GLUCOPHAGE-XR) 500 MG 24 hr tablet  12/03/14   Historical Provider, MD  methocarbamol (ROBAXIN) 500 MG tablet Take 1 tablet (500 mg total) by mouth 2 (two) times daily. 09/06/14   [name], PA-C  methocarbamol (ROBAXIN) 500 MG tablet Take 500 mg by mouth. 09/06/14    Historical Provider, MD  metoCLOPramide (REGLAN) 10 MG tablet Take 1 tablet (10 mg total) by mouth every 6 (six) hours as needed for nausea (headache). 05/21/15   Julianne Rice, MD  metroNIDAZOLE (FLAGYL) 500 MG tablet Take 1 tablet (500 mg total) by mouth 2 (two) times daily. 11/04/14   [name], PA-C  Multiple Vitamin (MULTIVITAMIN WITH MINERALS) TABS tablet Take 1 tablet by mouth daily.    Historical Provider, MD  naproxen (NAPROSYN) 500 MG tablet Take 1 tablet (500 mg total) by mouth 2 (two) times daily. 03/22/15   April Palumbo, MD  nitrofurantoin, macrocrystal-monohydrate, (MACROBID) 100 MG capsule  12/01/14   Historical Provider, MD  phenazopyridine (PYRIDIUM) 95 MG tablet Take 1 tablet (95 mg total) by mouth 3 (three) times daily as needed for pain. 08/16/14   [name], MD  sulfamethoxazole-trimethoprim (BACTRIM DS,SEPTRA DS) 800-160 MG tablet Take 1 tablet by mouth 2 (two) times daily. 05/18/15 05/25/15  Jola Schmidt, MD  tranexamic acid (LYSTEDA) 650 MG TABS tablet Take 2 tablets (1,300 mg total) by mouth 3 (three) times daily. 07/10/14   [name], PA-C   BP 121/57 mmHg  Pulse 81  Temp(Src) 98.6 F (37 C) (Oral)  Resp 18  Ht 5\' 7"  (1.702 m)  Wt 300 lb (136.079 kg)  BMI 46.98 kg/m2  SpO2 100%  LMP 04/23/2015 (Approximate) Physical Exam  Constitutional: She is oriented to person, place, and time. She appears well-developed and well-nourished. No distress.  Anxious  HENT:  Head: Normocephalic and atraumatic.  Mouth/Throat: Oropharynx is clear and moist. No oropharyngeal exudate.  Eyes: EOM are normal. Pupils are equal, round, and reactive to light.  Photophobia. Bilateral injected sclera. Negative Woods lamp exam. Pressures are 8 and 10 for the right and left eyes respectively. Mild right greater than left exophthalmus  Neck: Normal range of motion. Neck supple.  No meningismus  Cardiovascular: Normal rate and regular rhythm.  Exam reveals no gallop.    No murmur heard. Pulmonary/Chest: Effort normal and breath sounds normal. No respiratory distress. She has no wheezes. She has no rales. She exhibits no tenderness.  Abdominal: Soft. Bowel sounds are normal. She exhibits no distension and no mass. There is no tenderness. There is no rebound and no guarding.  Musculoskeletal: Normal range of motion. She exhibits no edema or tenderness.  Neurological: She is alert and oriented to person, place, and time.  Moves all extremities without deficit. Sensation is fully intact.  Skin: Skin is warm. No rash noted. She is not diaphoretic. No erythema.  Patient surgical wound to the left anterior thigh. There is no overlying erythema. Small amount of serous drainage from the site  Psychiatric: She has a normal mood and affect. Her behavior is normal.  Nursing note and vitals reviewed.   ED Course  Procedures (including critical care time) Labs Review Labs Reviewed  CBC WITH DIFFERENTIAL/PLATELET - Abnormal; Notable for the following:    Hemoglobin 9.4 (*)  HCT 31.3 (*)    MCV 75.4 (*)    MCH 22.7 (*)    RDW 17.6 (*)    All other components within normal limits  BASIC METABOLIC PANEL - Abnormal; Notable for the following:    Sodium 134 (*)    Chloride 100 (*)    Calcium 8.8 (*)    All other components within normal limits    Imaging Review Ct Orbitss W/o Cm  05/21/2015  CLINICAL DATA:  Insect bite to RIGHT thigh. Pain and pressure behind eyes. EXAM: CT ORBITS WITHOUT CONTRAST TECHNIQUE: Multidetector CT imaging of the orbits was performed following the standard protocol without intravenous contrast. COMPARISON:  None. FINDINGS: The globes are normal. The intraconal contents are normal. Intraconal fat is clear. The pre septal swelling evident. Limited view of the brain is unremarkable. No osseous abnormality. IMPRESSION: No orbital abnormality. Electronically Signed   By: Suzy Bouchard M.D.   On: 05/21/2015 14:10   I have personally  reviewed and evaluated these images and lab results as part of my medical decision-making.   EKG Interpretation None      MDM   Final diagnoses:  Encounter for wound re-check  Bilateral conjunctivitis  Migraine without status migrainosus, not intractable, unspecified migraine type  Anxiety   Patient states she is feeling much better after medication. She is refusing CT scan. Normal intraocular pressures to both eyes. She states she will follow-up with her ophthalmologist today. She's been given return precautions and is voiced understanding.     Julianne Rice, MD 05/21/15 1425

## 2015-05-21 NOTE — Discharge Instructions (Signed)
Viral Conjunctivitis Viral conjunctivitis is an inflammation of the clear membrane that covers the white part of your eye and the inner surface of your eyelid (conjunctiva). The inflammation is caused by a viral infection. The blood vessels in the conjunctiva become inflamed, causing the eye to become red or pink, and often itchy. Viral conjunctivitis can easily be passed from one person to another (contagious). CAUSES  Viral conjunctivitis is caused by a virus. A virus is a type of contagious germ. It can be spread by touching objects that have been contaminated with the virus, such as doorknobs or towels.  SYMPTOMS  Symptoms of viral conjunctivitis may include:   Eye redness.  Tearing or watery eyes.  Itchy eyes.  Burning feeling in the eyes.  Clear drainage from the eye.  Swollen eyelids.  A gritty feeling in the eye.  Light sensitivity. DIAGNOSIS  Viral conjunctivitis may be diagnosed with a medical history and physical exam. If you have discharge from your eye, the discharge may be tested to rule out other causes of conjunctivitis.  TREATMENT  Viral conjunctivitis does not respond to medicines that kill bacteria (antibiotics). Treatment for viral conjunctivitis is directed at stopping a bacterial infection from developing in addition to the viral infection. Treatment also aims to relieve your symptoms, such as itching. This may be done with antihistamine drops or other eye medicines. HOME CARE INSTRUCTIONS  Take medicines only as directed by your health care provider.  Avoid touching or rubbing your eyes.  Apply a warm, clean washcloth to your eye for 10-20 minutes, 3-4 times per day.  If you wear contact lenses, do not wear them until the inflammation is gone and your health care provider says it is safe to wear them again. Ask your health care provider how to sterilize or replace your contact lenses before using them again. Wear glasses until you can resume wearing  contacts.  Avoid wearing eye makeup until the inflammation is gone. Throw away any old eye cosmetics that may be contaminated.  Change or wash your pillowcase every day.  Do not share towels or washcloths. This may spread the infection.  Wash your hands often with soap and water. Use paper towels to dry your hands.  Gently wipe away any drainage from your eye with a warm, wet washcloth or a cotton ball.  Be very careful to avoid touching the edge of the eyelid with the eye drop bottle or ointment tube when applying medicines to the affected eye. This will stop you from spreading the infection to the other eye or to other people. SEEK MEDICAL CARE IF:   Your symptoms do not improve with treatment.  You have increased pain.  Your vision becomes blurry.  You have a fever.  You have facial pain, redness, or swelling.  You have new symptoms.  Your symptoms get worse.   This information is not intended to replace advice given to you by your health care provider. Make sure you discuss any questions you have with your health care provider.   Document Released: 03/12/2002 Document Revised: 06/13/2005 Document Reviewed: 10/01/2013 Elsevier Interactive Patient Education 2016 Elsevier Inc.   Generalized Anxiety Disorder Generalized anxiety disorder (GAD) is a mental disorder. It interferes with life functions, including relationships, work, and school. GAD is different from normal anxiety, which everyone experiences at some point in their lives in response to specific life events and activities. Normal anxiety actually helps [place] prepare for and get through these life events and activities. Normal  anxiety goes away after the event or activity is over.  GAD causes anxiety that is not necessarily related to specific events or activities. It also causes excess anxiety in proportion to specific events or activities. The anxiety associated with GAD is also difficult to control. GAD can vary from  mild to severe. People with severe GAD can have intense waves of anxiety with physical symptoms (panic attacks).  SYMPTOMS The anxiety and worry associated with GAD are difficult to control. This anxiety and worry are related to many life events and activities and also occur more days than not for 6 months or longer. People with GAD also have three or more of the following symptoms (one or more in children):  Restlessness.   Fatigue.  Difficulty concentrating.   Irritability.  Muscle tension.  Difficulty sleeping or unsatisfying sleep. DIAGNOSIS GAD is diagnosed through an assessment by your health care provider. Your health care provider will ask you questions aboutyour mood,physical symptoms, and events in your life. Your health care provider may ask you about your medical history and use of alcohol or drugs, including prescription medicines. Your health care provider may also do a physical exam and blood tests. Certain medical conditions and the use of certain substances can cause symptoms similar to those associated with GAD. Your health care provider may refer you to a mental health specialist for further evaluation. TREATMENT The following therapies are usually used to treat GAD:   Medication. Antidepressant medication usually is prescribed for long-term daily control. Antianxiety medicines may be added in severe cases, especially when panic attacks occur.   Talk therapy (psychotherapy). Certain types of talk therapy can be helpful in treating GAD by providing support, education, and guidance. A form of talk therapy called cognitive behavioral therapy can teach you healthy ways to think about and react to daily life events and activities.  Stress managementtechniques. These include yoga, meditation, and exercise and can be very helpful when they are practiced regularly. A mental health specialist can help determine which treatment is best for you. Some people see improvement with  one therapy. However, other people require a combination of therapies.   This information is not intended to replace advice given to you by your health care provider. Make sure you discuss any questions you have with your health care provider.   Document Released: 04/16/2012 Document Revised: 01/10/2014 Document Reviewed: 04/16/2012 Elsevier Interactive Patient Education 2016 [employer].   Substance Abuse Treatment Programs  Intensive Outpatient Programs [facility] Services     [address], [place]       [facility] [place] [address] [place], [place]  [place] [facility]     (Inpatient and outpatient)     [address]           [place] [phone] (Suboxone and Methadone)  [place], [place] [address]      [phone]       [id number] [place], [place]  [facility] [place] ([place], [place])    (insurance only) [phone]             [facility] ([place]) [place], [place]     [facility]  [place], [place]       [facility] (for caregivers and family) [phone] [address] [place]een [place]. [place], [place]      Residential Treatment Programs [facility]      [address] [place], [place] [address]  [phone]       [facility] [address], [place], [place] [address] [phone]  [facility] [place]        [phone]       [facility] [place] [phone]  [facility] [place] ([place]             [place], [place] or [place] of [place],  [address] [phone]  [facility]    [address]      [place], [place]       [facility] [address] [place], [place]  [place]   [address] [place], [place] [address]     [phone]      Admissions: 8am-3pm M-F  Residential Treatment Services (RTS) [address] [place], [place]  [facility] Program: Residential Program [phone] Days)   [place], [place] or [phone]     ADATC: [facility] [place], [place] (Walk in Hours over the weekend or by referral)  [place], [place], [place] [address] [phone]  Crisis Mobile: [facility]:  [phone] (for crisis response 24 hours a day) [facility] Hotline:      [phone] Outpatient Psychiatry and Counseling  [facility]: Mobile Crisis Management 24 hours:  [phone]  [facility] of the [employer] sliding scale fee and walk in schedule: M-F 8am-12pm/1pm-3pm [place], [place] [address] [place], [place] [address] [phone]  [facility] ([facility] [employer])- new patient walk-in appointments available Monday - Friday 8am -3pm.          [address] [place], [place] [address] [phone] or crisis line- [facility] [facility]/ Intensive Outpatient Therapy Program [place], [place] [address] Index      [phone] [address] [place], [place] [address]                 [place]   [facility] [phone]. [place], [place] [address]   [employer]  [facility]          [address] Johnette Abraham   [place], [place] [address]       [phone]  [place], [place], [place] [address] [phone]  [facility]    [address] [place], [place] [address]     [place], [place] Joycelyn Man     [place] [address]     [phone]       [facility] [place] [address]  [facility]     [address], [place], [place], [place] [address] [place]     [address]     [place], [place] [address]     [phone]       [facility] [address] [place], [place] [address] [phone] Resources for Temporary Residential Assistance/Crisis [place] [facility]) M-F 8am-3pm   [address] [place], [place] [address]   [phone] Services include: laundry, barbering, support groups, case management, phone  & computer access, showers, AA/NA mtgs, mental health/substance abuse nurse, job skills class, disability information, VA assistance, spiritual classes, etc.   [facility]   [address], [place]     [place]              [employer] (women and children)       [place]. [place], [place] [address] [phone] [email or web address] for application and process Application Required  [facility]   [address]    [place] [address]     [phone]                    [place], [place] [address] [id number] application appt.) Application Required  [facility] (women only)    [address]     [place], [place]  [address]     [phone]      Intake starts 6pm daily Need valid ID, SSC, & Police report [employer] [address] [place],  [id number] Application Required  [employer] (men only)     [place]      [place], [place]       [place] (Pregnant women only) [address] [place], [place]  [facility]      [place].      [place],  [address]     [place] [address] [place], [place] 90 day commitment/SA/Application process  [facility](men only)     [address]     [place], [place]       Check-in at [employer] of [place] [address] [place], [place]  [address] [phone] Men/Women/Women and Children must be there by 7 pm  [place], [place]

## 2015-05-21 NOTE — ED Notes (Signed)
Patient transported to CT 

## 2015-05-21 NOTE — ED Notes (Signed)
I&D left upper leg 4 days ago. Here today with redness to her leg. Draining and pain. Pressure behind her eyes and they are red.

## 2015-06-06 IMAGING — CR DG CHEST 2V
2 series · 2 of 2 positions shown · non-contrast
Comparison: 09/20/2012

CLINICAL DATA: Cough and congestion

EXAM:
CHEST  2 VIEW

[w chest pa]
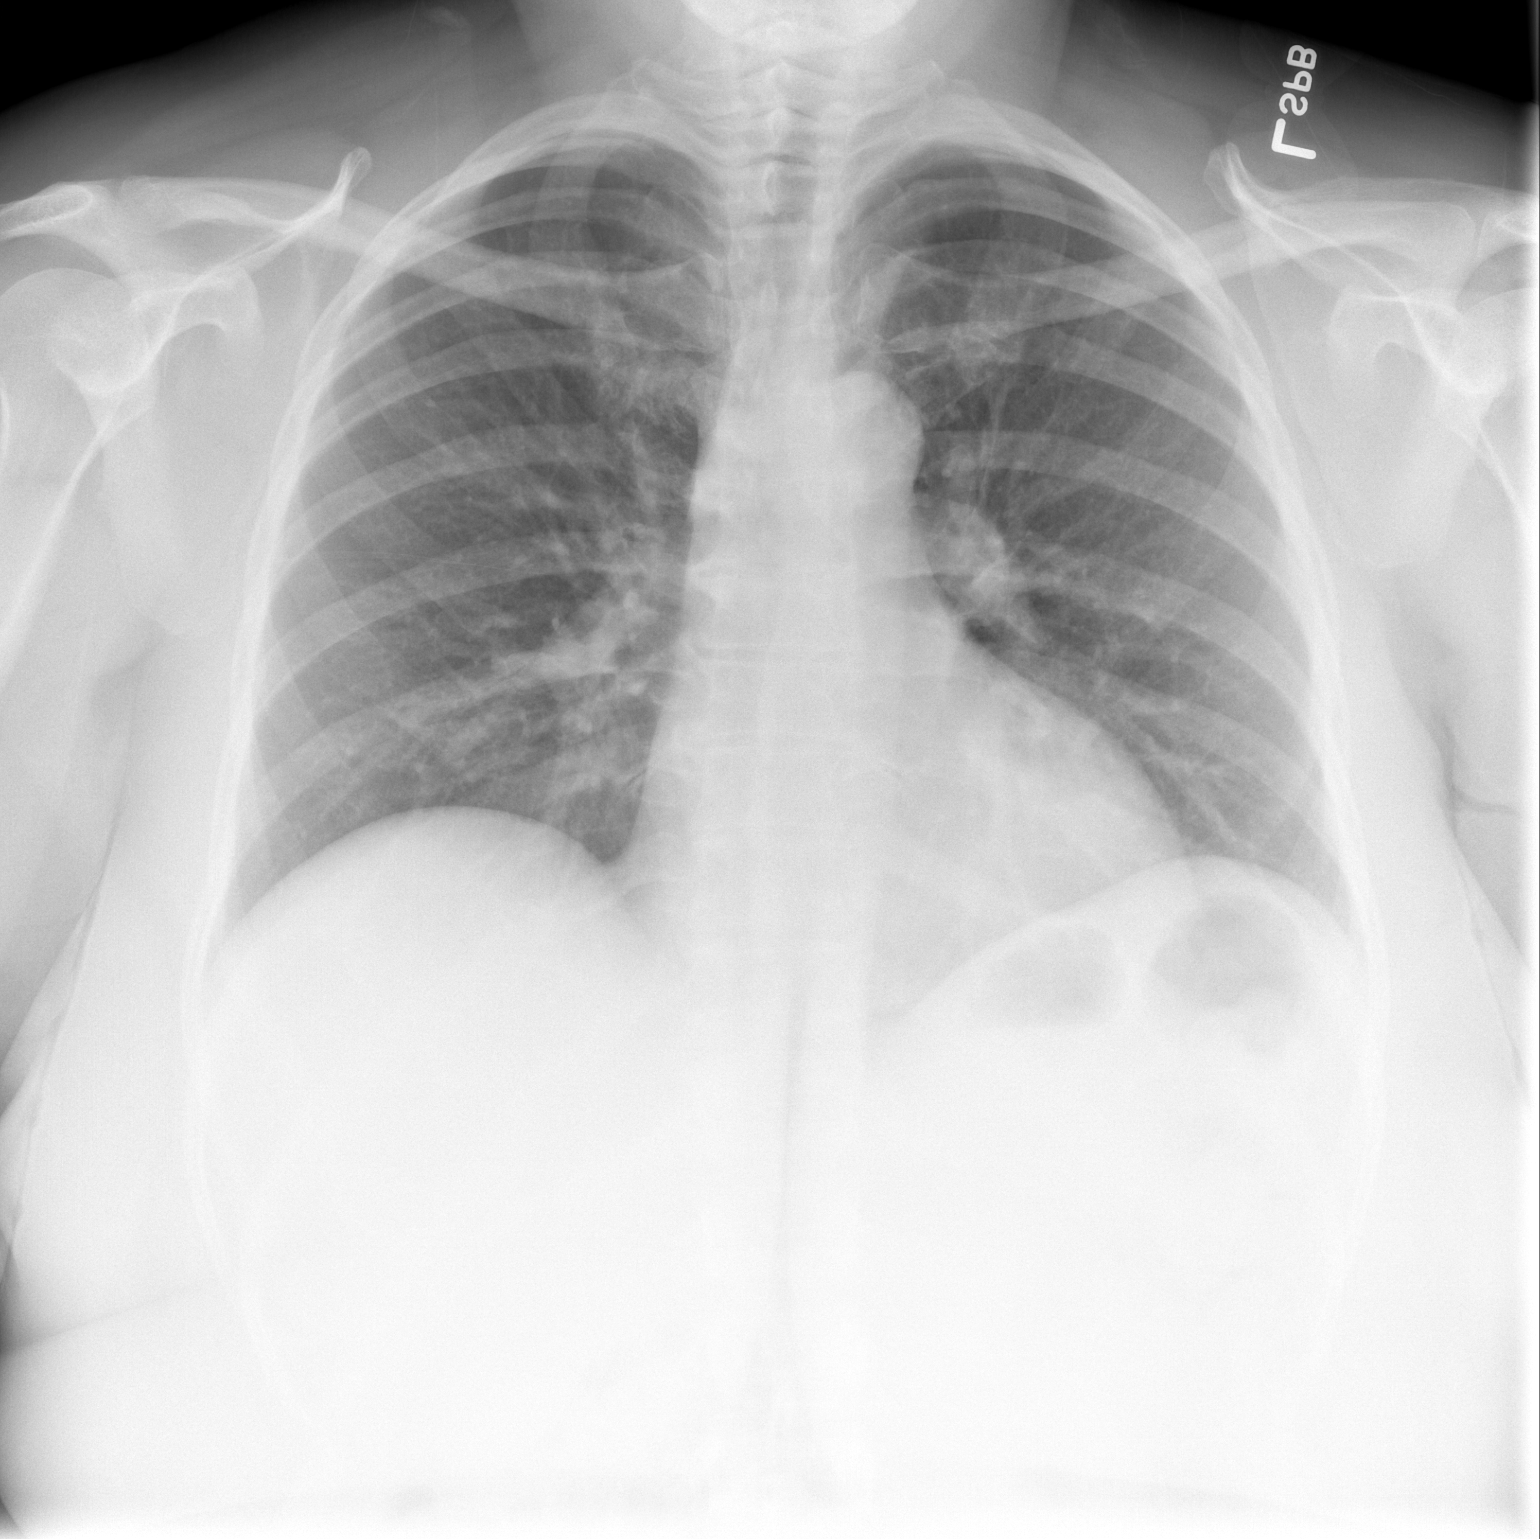

[w chest lat]
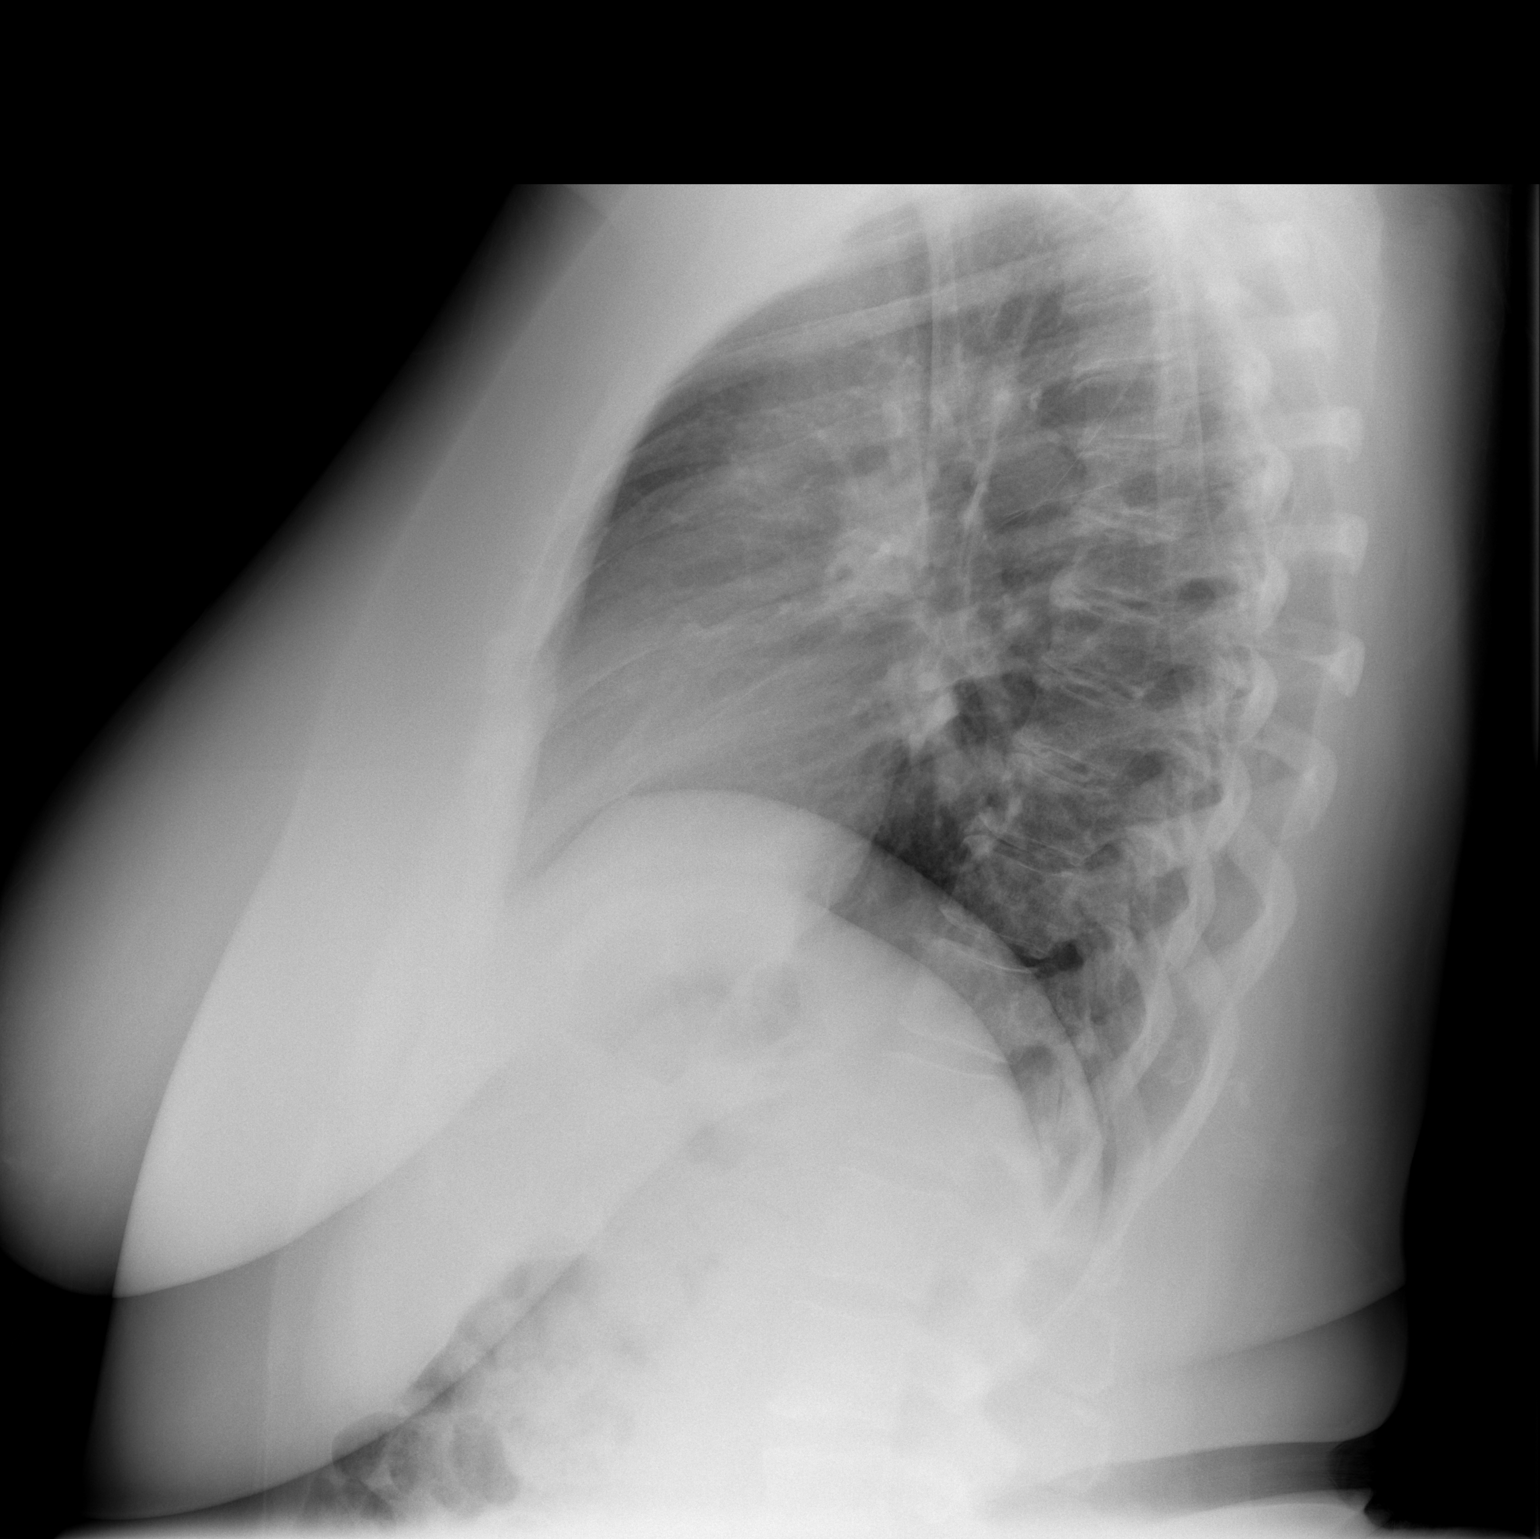

[2 of 2 positions shown; findings below may reference images not displayed]

FINDINGS: The heart size and mediastinal contours are within normal limits.
Both lungs are clear. The visualized skeletal structures are
unremarkable.
IMPRESSION: No active cardiopulmonary disease.

## 2015-06-25 ENCOUNTER — Encounter: Payer: Self-pay | Admitting: Neurology

## 2015-06-25 ENCOUNTER — Ambulatory Visit (INDEPENDENT_AMBULATORY_CARE_PROVIDER_SITE_OTHER): Payer: Medicaid Other | Admitting: Neurology

## 2015-06-25 VITALS — BP 136/85 | HR 79 | Ht 67.0 in | Wt 316.0 lb

## 2015-06-25 DIAGNOSIS — G43001 Migraine without aura, not intractable, with status migrainosus: Secondary | ICD-10-CM

## 2015-06-25 DIAGNOSIS — H11413 Vascular abnormalities of conjunctiva, bilateral: Secondary | ICD-10-CM

## 2015-06-25 DIAGNOSIS — H05119 Granuloma of unspecified orbit: Secondary | ICD-10-CM | POA: Diagnosis not present

## 2015-06-25 DIAGNOSIS — R51 Headache: Secondary | ICD-10-CM | POA: Diagnosis not present

## 2015-06-25 DIAGNOSIS — H539 Unspecified visual disturbance: Secondary | ICD-10-CM | POA: Diagnosis not present

## 2015-06-25 DIAGNOSIS — H5713 Ocular pain, bilateral: Secondary | ICD-10-CM | POA: Diagnosis not present

## 2015-06-25 DIAGNOSIS — I671 Cerebral aneurysm, nonruptured: Secondary | ICD-10-CM

## 2015-06-25 DIAGNOSIS — I770 Arteriovenous fistula, acquired: Secondary | ICD-10-CM

## 2015-06-25 DIAGNOSIS — G4489 Other headache syndrome: Secondary | ICD-10-CM

## 2015-06-25 DIAGNOSIS — R519 Headache, unspecified: Secondary | ICD-10-CM

## 2015-06-25 DIAGNOSIS — H4943 Progressive external ophthalmoplegia, bilateral: Secondary | ICD-10-CM | POA: Diagnosis not present

## 2015-06-25 DIAGNOSIS — H532 Diplopia: Secondary | ICD-10-CM | POA: Diagnosis not present

## 2015-06-25 DIAGNOSIS — E8881 Metabolic syndrome: Secondary | ICD-10-CM | POA: Diagnosis not present

## 2015-06-25 DIAGNOSIS — H1589 Other disorders of sclera: Secondary | ICD-10-CM

## 2015-06-25 MED ORDER — OXYCODONE-ACETAMINOPHEN 10-325 MG PO TABS
1.0000 | ORAL_TABLET | Freq: Four times a day (QID) | ORAL | Status: DC | PRN
Start: 2015-06-25 — End: 2019-02-19

## 2015-06-25 NOTE — Progress Notes (Signed)
[facility]    Provider:  Dr Jaynee Eagles Referring Provider: [facility] Primary Care Physician:  [facility]   CC:  Headache  HPI:  Jaime Castaneda is a 45 y.o. female here as a referral from Dr. Rehabilitation for headache. PMHx fevere obesity, migraines, metabolic syndrome. . She has pressure and pain behind the eye sockets. Especially when straining. Her vision gets very blurred. She has redness of the eyes. Her eyes have been very red. Eye drops helped. She saw the optometrist who sent her here. She is taking ibuprofen every day. It started over a month ago she had a bite she thinks it was a spider bite and she is under a lot of stress at home. The eye doctor thinks she has thyroid eye disease. A migraine cocktail helped with her symptoms in the ED. Symptoms are continuous, She has a severe headache every morning with headaches, starts behind the eye balls, they are swollen, she has headache and blurred vision. Also bad at night. Bilateral, pain on eye movement, even trying to clos ethe eyus hurt, pressure behind the eye sockets. She has light and sound sensitivity, wearing sun glasses all the time, she is straining to see and keep eyes open. She has metabolic syndrome. She feels shaky inside, she has had a cough for a while, TB negative and CXR negative. She feels like she is going to faint with palpitations and SOB she want to fall out and faint, optometrist suspects graves' disease. BP has been elevated. Hx of thyroid disease in the family with mom and aunts. No FHx of migraines. Patient is being evaluated by primary care for thyroid eye disease, going to see [employer] on [address], seeing [facility].   Reviewed notes, labs and imaging from outside physicians, which showed:  CT of the orbits 05/21/2015:  FINDINGS: The globes are normal. The intraconal contents are normal. Intraconal fat is clear. The pre septal  swelling evident. Limited view of the brain is unremarkable. No osseous abnormality.  IMPRESSION: No orbital abnormality.  CT of the head 11/2010 showed No acute intracranial abnormalities including mass lesion or mass effect, hydrocephalus, extra-axial fluid collection, midline shift, hemorrhage, or acute infarction, large ischemic events (personally reviewed images)  CBC 05/21/2015 showed anemia 9.4/31.3 BMP sodium 134 otherwise unremarkable, nml creatinine 0.88   Review of Systems: Patient complains of symptoms per HPI as well as the following symptoms: fatigue, blurred vision, double vision, eye pain, SOB, wheezing, palpitations, dizziness, anxiety, note enough sleep, decreased energy. Pertinent negatives per HPI. All others negative.   Social History   Social History  . Marital Status: Married    Spouse Name: Jaime Castaneda  . Number of Children: 2  . 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 Smoking status: Never Smoker   . Smokeless tobacco: Never Used  . Alcohol Use: No  . Drug Use: No  . Sexual Activity: Not on file   Other Topics Concern  . Not on file   Social History Narrative   Lives with family   Caffeine use: none    Family History  Problem Relation Age of Onset  . Heart disease    . Thyroid disease    . Bone cancer    . High blood pressure    . Migraines Neg Hx     Past Medical History  Diagnosis Date  .  TMJ arthralgia   . Asthma   . Anemia   . Obesity   . Migraine   . Anxiety     Past Surgical History  Procedure Laterality Date  . Tubal ligation  2003  . Shoulder surgery      Current Outpatient Prescriptions  Medication Sig Dispense Refill  . albuterol (PROVENTIL HFA;VENTOLIN HFA) 108 (90 Base) MCG/ACT inhaler Inhale 2 puffs into the lungs as needed.    . ALPRAZolam (XANAX) 0.25 MG tablet Take 1 tablet (0.25 mg total) by mouth 3 (three) times daily as needed for anxiety. 10  tablet 0  . benzonatate (TESSALON) 100 MG capsule Take 1 capsule (100 mg total) by mouth every 8 (eight) hours. 21 capsule 0  . BOOSTRIX 5-2.5-18.5 injection Inject 1 Dose into the muscle once.  0  . ferrous sulfate (FERROUSUL) 325 (65 FE) MG tablet Take 1 tablet (325 mg total) by mouth 2 (two) times daily with a meal. 60 tablet 1  . ferrous sulfate (FERROUSUL) 325 (65 FE) MG tablet Take 325 mg by mouth.    . fluticasone (FLONASE) 50 MCG/ACT nasal spray Place 2 sprays into both nostrils daily. 16 g 0  . hydrOXYzine (ATARAX/VISTARIL) 25 MG tablet Take 25 mg by mouth 3 (three) times daily as needed.    Marland Kitchen ibuprofen (ADVIL,MOTRIN) 600 MG tablet Take 1 tablet (600 mg total) by mouth every 8 (eight) hours as needed for moderate pain. 30 tablet 0  . ipratropium (ATROVENT) 0.06 % nasal spray Place 2 sprays into both nostrils 4 (four) times daily. 15 mL 0  . ipratropium-albuterol (DUONEB) 0.5-2.5 (3) MG/3ML SOLN 3 mLs as needed.    . metoCLOPramide (REGLAN) 10 MG tablet Take 1 tablet (10 mg total) by mouth every 6 (six) hours as needed for nausea (headache). 30 tablet 0  . naproxen (NAPROSYN) 500 MG tablet Take 1 tablet (500 mg total) by mouth 2 (two) times daily. 30 tablet 0  . oxyCODONE-acetaminophen (PERCOCET) 10-325 MG tablet Take 1 tablet by mouth every 6 (six) hours as needed for pain. 60 tablet 0   No current facility-administered medications for this visit.    Allergies as of 06/25/2015 - Review Complete 06/25/2015  Allergen Reaction Noted  . Cephalexin  12/11/2014  . Moxifloxacin  12/11/2014    Vitals: BP 136/85 mmHg  Pulse 79  Ht 5\' 7"  (1.702 m)  Wt 316 lb (143.337 kg)  BMI 49.48 kg/m2 Last Weight:  Wt Readings from Last 1 Encounters:  06/25/15 316 lb (143.337 kg)   Last Height:   Ht Readings from Last 1 Encounters:  06/25/15 5\' 7"  (1.702 m)   Physical exam: Exam: Gen: NAD, conversant, well nourised, morbidly obese, well groomed                     CV: RRR, no MRG. No Carotid  Bruits.  Eyes: Conjunctivae injection without exudates  Neuro: Detailed Neurologic Exam  Speech:    Speech is normal; fluent and spontaneous with normal comprehension.  Cognition:    The patient is oriented to person, place, and time;     recent and remote memory intact;     language fluent;     normal attention, concentration,     fund of knowledge Cranial Nerves:    The pupils are equal, round, and reactive to light. The fundi are normal and spontaneous venous pulsations are present. Visual fields are full to finger confrontation.  Ptosis, proptosis, incomplete abduction bilaterally. Trigeminal sensation is  intact and the muscles of mastication are normal. The face is symmetric. The palate elevates in the midline. Hearing intact. Voice is normal. Shoulder shrug is normal. The tongue has normal motion without fasciculations.   Coordination:    Normal finger to nose and heel to shin. Normal rapid alternating movements.   Gait:    Heel-toe and tandem gait are normal.   Motor Observation:    No asymmetry, no atrophy, and no involuntary movements noted. Tone:    Normal muscle tone.    Posture:    Posture is normal. normal erect    Strength:    Strength is V/V in the upper and lower limbs.      Sensation: intact to LT     Reflex Exam:  DTR's:    Deep tendon reflexes in the upper and lower extremities are normal bilaterally.   Toes:    The toes are downgoing bilaterally.   Clonus:    Clonus is absent.       Assessment/Plan:  45 year old with a past medical history one month of eye pain on movement, pressure behind the eyes, conjunctival injection, headaches, blurry vision,  Ptosis, proptosis, incomplete abduction bilaterally. DDX thyroid eye disease(she is being evaluated later today by primary care) intracranial hypertension, carotic-venous sinus fistula, other intra cranial etiologies.   Need MRI of the brain and orbits, CTA of the blood vessels, lumbar puncture. Also  recommend sleep evaluatoon  To prevent or relieve headaches, try the following: Cool Compress. Lie down and place a cool compress on your head.  Avoid headache triggers. If certain foods or odors seem to have triggered your migraines in the past, avoid them. A headache diary might help you identify triggers.  Include physical activity in your daily routine. Try a daily walk or other moderate aerobic exercise.  Manage stress. Find healthy ways to cope with the stressors, such as delegating tasks on your to-do list.  Practice relaxation techniques. Try deep breathing, yoga, massage and visualization.  Eat regularly. Eating regularly scheduled meals and maintaining a healthy diet might help prevent headaches. Also, drink plenty of fluids.  Follow a regular sleep schedule. Sleep deprivation might contribute to headaches Consider biofeedback. With this mind-body technique, you learn to control certain bodily functions - such as muscle tension, heart rate and blood pressure - to prevent headaches or reduce headache pain.    Proceed to emergency room if you experience new or worsening symptoms or symptoms do not resolve, if you have new neurologic symptoms or if headache is severe, or for any concerning symptom.     Sarina Ill, MD  [facility] [address] [place], [place] [address]  Phone [phone] Fax [phone]

## 2015-06-25 NOTE — Patient Instructions (Signed)
Remember to drink plenty of fluid, eat healthy meals and do not skip any meals. Try to eat protein with a every meal and eat a healthy snack such as fruit or nuts in between meals. Try to keep a regular sleep-wake schedule and try to exercise daily, particularly in the form of walking, 20-30 minutes a day, if you can.   As far as your medications are concerned, I would like to suggest  As far as diagnostic testing: MRI of the brain, CT of the blood vessels, may need a lumbar puncture. Consider sleep evaluation in the future.  I would like to see you back in 4 weeks, sooner if we need to. Please call us with any interim questions, concerns, problems, updates or refill requests.   Our phone number is [phone]. We also have an after hours call service for urgent matters and there is a physician on-call for urgent questions. For any emergencies you know to call 911 or go to the nearest emergency room

## 2015-06-27 ENCOUNTER — Encounter: Payer: Self-pay | Admitting: Neurology

## 2015-06-27 DIAGNOSIS — R51 Headache: Secondary | ICD-10-CM

## 2015-06-27 DIAGNOSIS — E8881 Metabolic syndrome: Secondary | ICD-10-CM | POA: Insufficient documentation

## 2015-06-27 DIAGNOSIS — R519 Headache, unspecified: Secondary | ICD-10-CM | POA: Insufficient documentation

## 2015-06-27 DIAGNOSIS — H539 Unspecified visual disturbance: Secondary | ICD-10-CM | POA: Insufficient documentation

## 2015-06-27 DIAGNOSIS — G43909 Migraine, unspecified, not intractable, without status migrainosus: Secondary | ICD-10-CM | POA: Insufficient documentation

## 2015-07-01 ENCOUNTER — Other Ambulatory Visit: Payer: Self-pay | Admitting: Neurology

## 2015-07-06 ENCOUNTER — Ambulatory Visit
Admission: RE | Admit: 2015-07-06 | Discharge: 2015-07-06 | Disposition: A | Discharge status: Home / Self Care | Payer: Medicaid Other | Source: Ambulatory Visit | Attending: Neurology | Admitting: Neurology

## 2015-07-06 ENCOUNTER — Other Ambulatory Visit: Payer: Medicaid Other

## 2015-07-06 ENCOUNTER — Telehealth: Payer: Self-pay | Admitting: Neurology

## 2015-07-06 DIAGNOSIS — H05119 Granuloma of unspecified orbit: Secondary | ICD-10-CM

## 2015-07-06 DIAGNOSIS — G4489 Other headache syndrome: Secondary | ICD-10-CM

## 2015-07-06 DIAGNOSIS — H1589 Other disorders of sclera: Secondary | ICD-10-CM

## 2015-07-06 DIAGNOSIS — H4943 Progressive external ophthalmoplegia, bilateral: Secondary | ICD-10-CM

## 2015-07-06 DIAGNOSIS — H532 Diplopia: Secondary | ICD-10-CM

## 2015-07-06 DIAGNOSIS — H5713 Ocular pain, bilateral: Secondary | ICD-10-CM

## 2015-07-06 MED ORDER — ALPRAZOLAM 0.5 MG PO TABS: ORAL_TABLET | ORAL | Status: DC

## 2015-07-06 NOTE — Telephone Encounter (Signed)
Tried calling pt back. Got busy signal. Faxed printed rx xanax to pt pharmacy at [phone]. Received fax confirmation.  Please have her call [facility] at [phone] to r/s MRI.

## 2015-07-06 NOTE — Addendum Note (Signed)
Addended by: Marcial Pacas on: 07/06/2015 05:37 PM   Modules accepted: Orders

## 2015-07-06 NOTE — Telephone Encounter (Signed)
I have written Xanax as needed prior to MRI

## 2015-07-06 NOTE — Telephone Encounter (Signed)
Dr Krista Blue- Dr Jaynee Eagles is out of the office until Wed. Are you ok with giving something prior to MRI for pt?

## 2015-07-06 NOTE — Telephone Encounter (Signed)
Pt called and says that MRI was not done due to anxiety. She wants to r/s but would like something to help calm her down before hand. Please call and advise

## 2015-07-08 NOTE — Telephone Encounter (Signed)
Tried calling pt again. LVM for pt relaying message below. Please relay message below if she calls back. Gave [facility] phone number.

## 2015-07-16 ENCOUNTER — Telehealth: Payer: Self-pay | Admitting: *Deleted

## 2015-07-16 ENCOUNTER — Ambulatory Visit: Payer: Medicaid Other | Admitting: Neurology

## 2015-07-16 NOTE — Telephone Encounter (Signed)
no showed f/u 

## 2015-07-20 ENCOUNTER — Encounter: Payer: Self-pay | Admitting: Neurology

## 2015-07-20 ENCOUNTER — Other Ambulatory Visit: Payer: Medicaid Other

## 2015-07-21 ENCOUNTER — Telehealth: Payer: Self-pay | Admitting: *Deleted

## 2015-07-21 NOTE — Telephone Encounter (Signed)
Release faxed to [facility] requesting notes and labs.

## 2015-07-22 ENCOUNTER — Telehealth: Payer: Self-pay | Admitting: *Deleted

## 2015-07-22 NOTE — Telephone Encounter (Signed)
Pt records on [address] from [facility].

## 2016-04-15 ENCOUNTER — Inpatient Hospital Stay (HOSPITAL_COMMUNITY)
Admission: AD | Admit: 2016-04-15 | Discharge: 2016-04-15 | Disposition: A | Discharge status: Home / Self Care | Payer: Medicaid Other | Source: Ambulatory Visit | Attending: Obstetrics and Gynecology | Admitting: Obstetrics and Gynecology

## 2016-04-15 ENCOUNTER — Encounter (HOSPITAL_COMMUNITY): Payer: Self-pay | Admitting: *Deleted

## 2016-04-15 DIAGNOSIS — Z881 Allergy status to other antibiotic agents status: Secondary | ICD-10-CM | POA: Diagnosis not present

## 2016-04-15 DIAGNOSIS — F419 Anxiety disorder, unspecified: Secondary | ICD-10-CM | POA: Insufficient documentation

## 2016-04-15 DIAGNOSIS — J45909 Unspecified asthma, uncomplicated: Secondary | ICD-10-CM | POA: Insufficient documentation

## 2016-04-15 DIAGNOSIS — N939 Abnormal uterine and vaginal bleeding, unspecified: Secondary | ICD-10-CM

## 2016-04-15 DIAGNOSIS — R42 Dizziness and giddiness: Secondary | ICD-10-CM | POA: Insufficient documentation

## 2016-04-15 DIAGNOSIS — K5900 Constipation, unspecified: Secondary | ICD-10-CM | POA: Insufficient documentation

## 2016-04-15 DIAGNOSIS — R109 Unspecified abdominal pain: Secondary | ICD-10-CM | POA: Insufficient documentation

## 2016-04-15 DIAGNOSIS — Z8249 Family history of ischemic heart disease and other diseases of the circulatory system: Secondary | ICD-10-CM | POA: Diagnosis not present

## 2016-04-15 DIAGNOSIS — Z6841 Body Mass Index (BMI) 40.0 and over, adult: Secondary | ICD-10-CM | POA: Diagnosis not present

## 2016-04-15 DIAGNOSIS — N938 Other specified abnormal uterine and vaginal bleeding: Secondary | ICD-10-CM | POA: Insufficient documentation

## 2016-04-15 DIAGNOSIS — E669 Obesity, unspecified: Secondary | ICD-10-CM | POA: Insufficient documentation

## 2016-04-15 DIAGNOSIS — Z9851 Tubal ligation status: Secondary | ICD-10-CM | POA: Diagnosis not present

## 2016-04-15 DIAGNOSIS — G43909 Migraine, unspecified, not intractable, without status migrainosus: Secondary | ICD-10-CM | POA: Diagnosis not present

## 2016-04-15 DIAGNOSIS — Z3202 Encounter for pregnancy test, result negative: Secondary | ICD-10-CM | POA: Insufficient documentation

## 2016-04-15 DIAGNOSIS — Z8349 Family history of other endocrine, nutritional and metabolic diseases: Secondary | ICD-10-CM | POA: Diagnosis not present

## 2016-04-15 DIAGNOSIS — Z9889 Other specified postprocedural states: Secondary | ICD-10-CM | POA: Insufficient documentation

## 2016-04-15 DIAGNOSIS — Z808 Family history of malignant neoplasm of other organs or systems: Secondary | ICD-10-CM | POA: Diagnosis not present

## 2016-04-15 HISTORY — DX: Benign neoplasm of connective and other soft tissue, unspecified: D21.9

## 2016-04-15 LAB — CBC
HCT: 39.3 % (ref 36.0–46.0)
HEMOGLOBIN: 12.7 g/dL (ref 12.0–15.0)
MCH: 29.1 pg (ref 26.0–34.0)
MCHC: 32.3 g/dL (ref 30.0–36.0)
MCV: 89.9 fL (ref 78.0–100.0)
PLATELETS: 276 10*3/uL (ref 150–400)
RBC: 4.37 MIL/uL (ref 3.87–5.11)
RDW: 15.1 % (ref 11.5–15.5)
WBC: 6 10*3/uL (ref 4.0–10.5)

## 2016-04-15 LAB — TYPE AND SCREEN
ABO/RH(D): A POS
ANTIBODY SCREEN: NEGATIVE

## 2016-04-15 LAB — URINALYSIS, MICROSCOPIC (REFLEX)

## 2016-04-15 LAB — URINALYSIS, ROUTINE W REFLEX MICROSCOPIC

## 2016-04-15 LAB — POCT PREGNANCY, URINE: PREG TEST UR: NEGATIVE

## 2016-04-15 MED ORDER — TRAMADOL HCL 50 MG PO TABS: 50.0000 mg | ORAL_TABLET | Freq: Four times a day (QID) | ORAL | 0 refills | Status: DC | PRN

## 2016-04-15 MED ORDER — POLYETHYLENE GLYCOL 3350 17 G PO PACK: 17.0000 g | PACK | Freq: Every day | ORAL | 0 refills | Status: DC

## 2016-04-15 MED ORDER — MECLIZINE HCL 32 MG PO TABS: 32.0000 mg | ORAL_TABLET | Freq: Three times a day (TID) | ORAL | 0 refills | Status: DC | PRN

## 2016-04-15 MED ORDER — METHYLERGONOVINE MALEATE 0.2 MG PO TABS: 0.2000 mg | ORAL_TABLET | Freq: Three times a day (TID) | ORAL | 0 refills | Status: DC

## 2016-04-15 NOTE — MAU Note (Signed)
Had iron infusion yesterday.after she left she started feeling dizzy, nauseous, vertigo, lots of trapped gas,(vagina and back area), not able to have a bowel movement.  Having a lot of abd pain. About 20 minutes ago, she started having vag bleeding- had ablation 2/2; has not had bleeding or pain since.

## 2016-04-15 NOTE — Discharge Instructions (Signed)
Benign Positional Vertigo Vertigo is the feeling that you or your surroundings are moving when they are not. Benign positional vertigo is the most common form of vertigo. The cause of this condition is not serious (is benign). This condition is triggered by certain movements and positions (is positional). This condition can be dangerous if it occurs while you are doing something that could endanger you or others, such as driving. What are the causes? In many cases, the cause of this condition is not known. It may be caused by a disturbance in an area of the inner ear that helps your brain to sense movement and balance. This disturbance can be caused by a viral infection (labyrinthitis), head injury, or repetitive motion. What increases the risk? This condition is more likely to develop in:  Women.  People who are 21 years of age or older. What are the signs or symptoms? Symptoms of this condition usually happen when you move your head or your eyes in different directions. Symptoms may start suddenly, and they usually last for less than a minute. Symptoms may include:  Loss of balance and falling.  Feeling like you are spinning or moving.  Feeling like your surroundings are spinning or moving.  Nausea and vomiting.  Blurred vision.  Dizziness.  Involuntary eye movement (nystagmus). Symptoms can be mild and cause only slight annoyance, or they can be severe and interfere with daily life. Episodes of benign positional vertigo may return (recur) over time, and they may be triggered by certain movements. Symptoms may improve over time. How is this diagnosed? This condition is usually diagnosed by medical history and a physical exam of the head, neck, and ears. You may be referred to a health care provider who specializes in ear, nose, and throat (ENT) problems (otolaryngologist) or a provider who specializes in disorders of the nervous system (neurologist). You may have additional testing,  including:  MRI.  A CT scan.  Eye movement tests. Your health care provider may ask you to change positions quickly while he or she watches you for symptoms of benign positional vertigo, such as nystagmus. Eye movement may be tested with an electronystagmogram (ENG), caloric stimulation, the Dix-Hallpike test, or the roll test.  An electroencephalogram (EEG). This records electrical activity in your brain.  Hearing tests. How is this treated? Usually, your health care provider will treat this by moving your head in specific positions to adjust your inner ear back to normal. Surgery may be needed in severe cases, but this is rare. In some cases, benign positional vertigo may resolve on its own in 2-4 weeks. Follow these instructions at home: Safety   Move slowly.Avoid sudden body or head movements.  Avoid driving.  Avoid operating heavy machinery.  Avoid doing any tasks that would be dangerous to you or others if a vertigo episode would occur.  If you have trouble walking or keeping your balance, try using a cane for stability. If you feel dizzy or unstable, sit down right away.  Return to your normal activities as told by your health care provider. Ask your health care provider what activities are safe for you. General instructions   Take over-the-counter and prescription medicines only as told by your health care provider.  Avoid certain positions or movements as told by your health care provider.  Drink enough fluid to keep your urine clear or pale yellow.  Keep all follow-up visits as told by your health care provider. This is important. Contact a health care provider  if:  You have a fever.  Your condition gets worse or you develop new symptoms.  Your family or friends notice any behavioral changes.  Your nausea or vomiting gets worse.  You have numbness or a pins and needles sensation. Get help right away if:  You have difficulty speaking or moving.  You are  always dizzy.  You faint.  You develop severe headaches.  You have weakness in your legs or arms.  You have changes in your hearing or vision.  You develop a stiff neck.  You develop sensitivity to light. This information is not intended to replace advice given to you by your health care provider. Make sure you discuss any questions you have with your health care provider. Document Released: 09/27/2005 Document Revised: 05/28/2015 Document Reviewed: 04/14/2014 Elsevier Interactive Patient Education  2017 [employer]. Constipation, Adult Constipation is when a person has fewer bowel movements in a week than normal, has difficulty having a bowel movement, or has stools that are dry, hard, or larger than normal. Constipation may be caused by an underlying condition. It may become worse with age if a person takes certain medicines and does not take in enough fluids. Follow these instructions at home: Eating and drinking    Eat foods that have a lot of fiber, such as fresh fruits and vegetables, whole grains, and beans.  Limit foods that are high in fat, low in fiber, or overly processed, such as french fries, hamburgers, cookies, candies, and soda.  Drink enough fluid to keep your urine clear or pale yellow. General instructions   Exercise regularly or as told by your health care provider.  Go to the restroom when you have the urge to go. Do not hold it in.  Take over-the-counter and prescription medicines only as told by your health care provider. These include any fiber supplements.  Practice pelvic floor retraining exercises, such as deep breathing while relaxing the lower abdomen and pelvic floor relaxation during bowel movements.  Watch your condition for any changes.  Keep all follow-up visits as told by your health care provider. This is important. Contact a health care provider if:  You have pain that gets worse.  You have a fever.  You do not have a bowel movement  after 4 days.  You vomit.  You are not hungry.  You lose weight.  You are bleeding from the anus.  You have thin, pencil-like stools. Get help right away if:  You have a fever and your symptoms suddenly get worse.  You leak stool or have blood in your stool.  Your abdomen is bloated.  You have severe pain in your abdomen.  You feel dizzy or you faint. This information is not intended to replace advice given to you by your health care provider. Make sure you discuss any questions you have with your health care provider. Document Released: 09/18/2003 Document Revised: 07/10/2015 Document Reviewed: 06/10/2015 Elsevier Interactive Patient Education  2017 [employer]. Dysfunctional Uterine Bleeding Dysfunctional uterine bleeding is abnormal bleeding from the uterus. Dysfunctional uterine bleeding includes:  A period that comes earlier or later than usual.  A period that is lighter, heavier, or has blood clots.  Bleeding between periods.  Skipping one or more periods.  Bleeding after sexual intercourse.  Bleeding after menopause. Follow these instructions at home: Pay attention to any changes in your symptoms. Follow these instructions to help with your condition: Eating and drinking   Eat well-balanced meals. Include foods that are high in  iron, such as liver, meat, shellfish, green leafy vegetables, and eggs.  If you become constipated:  Drink plenty of water.  Eat fruits and vegetables that are high in water and fiber, such as spinach, carrots, raspberries, apples, and mango. Medicines   Take over-the-counter and prescription medicines only as told by your health care provider.  Do not change medicines without talking with your health care provider.  Aspirin or medicines that contain aspirin may make the bleeding worse. Do not take those medicines:  During the week before your period.  During your period.  If you were prescribed iron pills, take them as  told by your health care provider. Iron pills help to replace iron that your body loses because of this condition. Activity   If you need to change your sanitary pad or tampon more than one time every 2 hours:  Lie in bed with your feet raised (elevated).  Place a cold pack on your lower abdomen.  Rest as much as possible until the bleeding stops or slows down.  Do not try to lose weight until the bleeding has stopped and your blood iron level is back to normal. Other Instructions   For two months, write down:  When your period starts.  When your period ends.  When any abnormal bleeding occurs.  What problems you notice.  Keep all follow up visits as told by your health care provider. This is important. Contact a health care provider if:  You get light-headed or weak.  You have nausea and vomiting.  You cannot eat or drink without vomiting.  You feel dizzy or have diarrhea while you are taking medicines.  You are taking birth control pills or hormones, and you want to change them or stop taking them. Get help right away if:  You develop a fever or chills.  You need to change your sanitary pad or tampon more than one time per hour.  Your bleeding becomes heavier, or your flow contains clots more often.  You develop pain in your abdomen.  You lose consciousness.  You develop a rash. This information is not intended to replace advice given to you by your health care provider. Make sure you discuss any questions you have with your health care provider. Document Released: 12/18/1999 Document Revised: 05/28/2015 Document Reviewed: 03/17/2014 Elsevier Interactive Patient Education  2017 [employer].

## 2016-04-15 NOTE — MAU Provider Note (Signed)
Chief Complaint:  Abdominal Pain; Nausea; Dizziness; and Vaginal Bleeding   First Provider Initiated Contact with Patient 04/15/16 1843       HPI: Jaime Castaneda is a 46 y.o. [id number] who presents to maternity admissions reporting heavy vaginal bleeding which started today.  Had an ablation in February and has had no bleeding since then.  Done by Dr Posey Pronto in [facility].  Had iron infusion yesterday.   ALso having constipation with only small amounts coming out, has not tried a laxative.  Also has dizziness, sensation of spinning, rather than light headedness.  Has had it before (vertigo). She reports vaginal bleeding, vaginal itching/burning, urinary symptoms, h/a, dizziness, n/v, or fever/chills.    Abdominal Pain  This is a new problem. The current episode started today. The onset quality is gradual. The problem occurs intermittently. The problem has been unchanged. The pain is located in the LLQ, RLQ and suprapubic region. The pain is mild. The quality of the pain is cramping. The abdominal pain does not radiate. Associated symptoms include constipation. Pertinent negatives include no diarrhea, dysuria, fever, frequency, headaches, myalgias, nausea or vomiting. Nothing aggravates the pain. The pain is relieved by nothing. She has tried nothing for the symptoms.  Dizziness  This is a new problem. The current episode started today. The problem occurs intermittently. The problem has been unchanged. Associated symptoms include abdominal pain. Pertinent negatives include no fever, headaches, myalgias, nausea or vomiting. The symptoms are aggravated by walking and twisting. She has tried nothing for the symptoms.  Vaginal Bleeding  The patient's primary symptoms include pelvic pain and vaginal bleeding. The patient's pertinent negatives include no genital itching, genital lesions or genital odor. This is a new problem. The current episode started today. The problem occurs intermittently. The problem has  been unchanged. The pain is mild. The problem affects both sides. She is not pregnant. Associated symptoms include abdominal pain and constipation. Pertinent negatives include no diarrhea, dysuria, fever, frequency, headaches, nausea or vomiting. The vaginal discharge was bloody. The vaginal bleeding is heavier than menses. She has not been passing clots. She has not been passing tissue. Nothing aggravates the symptoms. She has tried nothing for the symptoms. Her past medical history is significant for a gynecological surgery (ablation).    RN Note: Had iron infusion yesterday.after she left she started feeling dizzy, nauseous, vertigo, lots of trapped gas,(vagina and back area), not able to have a bowel movement.  Having a lot of abd pain. About 20 minutes ago, she started having vag bleeding- had ablation 2/2; has not had bleeding or pain since.  Past Medical History: Past Medical History:  Diagnosis Date  . Anemia   . Anxiety   . Asthma   . Fibroid   . Migraine   . Obesity   . TMJ arthralgia     Past obstetric history: OB History  Gravida Para Term Preterm AB Living  2 2 2     2   SAB TAB Ectopic Multiple Live Births               # Outcome Date GA Lbr Len/2nd Weight Sex Delivery Anes PTL Lv  2 Term           1 Term               Past Surgical History: Past Surgical History:  Procedure Laterality Date  . ABLATION     uterine  . SHOULDER SURGERY    . TUBAL LIGATION  2003  Family History: Family History  Problem Relation Age of Onset  . Heart disease    . Thyroid disease    . Bone cancer    . High blood pressure    . Migraines Neg Hx     Social History: Social History  Substance Use Topics  . Smoking status: Never Smoker  . Smokeless tobacco: Never Used  . Alcohol use No    Allergies:  Allergies  Allergen Reactions  . Cephalexin   . Moxifloxacin     Meds:  Prescriptions Prior to Admission  Medication Sig Dispense Refill Last Dose  . ALPRAZolam  (XANAX) 0.25 MG tablet Take 1 tablet (0.25 mg total) by mouth 3 (three) times daily as needed for anxiety. 10 tablet 0 Taking  . ALPRAZolam (XANAX) 0.5 MG tablet Prior to MRI 2 tablet 0   . benzonatate (TESSALON) 100 MG capsule Take 1 capsule (100 mg total) by mouth every 8 (eight) hours. 21 capsule 0 Taking  . BOOSTRIX 5-2.5-18.5 injection Inject 1 Dose into the muscle once.  0 Taking  . ferrous sulfate (FERROUSUL) 325 (65 FE) MG tablet Take 1 tablet (325 mg total) by mouth 2 (two) times daily with a meal. 60 tablet 1 Taking  . ferrous sulfate (FERROUSUL) 325 (65 FE) MG tablet Take 325 mg by mouth.   Taking  . fluticasone (FLONASE) 50 MCG/ACT nasal spray Place 2 sprays into both nostrils daily. 16 g 0 Taking  . hydrOXYzine (ATARAX/VISTARIL) 25 MG tablet Take 25 mg by mouth 3 (three) times daily as needed.   Taking  . ibuprofen (ADVIL,MOTRIN) 600 MG tablet Take 1 tablet (600 mg total) by mouth every 8 (eight) hours as needed for moderate pain. 30 tablet 0 Taking  . ipratropium (ATROVENT) 0.06 % nasal spray Place 2 sprays into both nostrils 4 (four) times daily. 15 mL 0 Taking  . ipratropium-albuterol (DUONEB) 0.5-2.5 (3) MG/3ML SOLN 3 mLs as needed.   Taking  . metoCLOPramide (REGLAN) 10 MG tablet Take 1 tablet (10 mg total) by mouth every 6 (six) hours as needed for nausea (headache). 30 tablet 0 Taking  . naproxen (NAPROSYN) 500 MG tablet Take 1 tablet (500 mg total) by mouth 2 (two) times daily. 30 tablet 0 Taking  . oxyCODONE-acetaminophen (PERCOCET) 10-325 MG tablet Take 1 tablet by mouth every 6 (six) hours as needed for pain. 60 tablet 0     I have reviewed patient's Past Medical Hx, Surgical Hx, Family Hx, Social Hx, medications and allergies.  ROS:  Review of Systems  Constitutional: Negative for fever.  Gastrointestinal: Positive for abdominal pain and constipation. Negative for diarrhea, nausea and vomiting.  Genitourinary: Positive for pelvic pain and vaginal bleeding. Negative for  dysuria and frequency.  Musculoskeletal: Negative for myalgias.  Neurological: Positive for dizziness. Negative for headaches.   Other systems negative     Physical Exam  Patient Vitals for the past 24 hrs:  BP Temp Temp src Pulse Resp SpO2 Weight  04/15/16 1819 (!) 145/78 98.8 F (37.1 C) Oral 90 20 98 % (!) 326 lb (147.9 kg)   Constitutional: Well-developed, well-nourished female in no acute distress.  Cardiovascular: normal rate and rhythm, no ectopy audible, S1 & S2 heard, no murmur Respiratory: normal effort, no distress. Lungs CTAB with no wheezes or crackles GI: Abd soft, non-tender.  Nondistended.  No rebound, No guarding.  Bowel Sounds audible  MS: Extremities nontender, no edema, normal ROM Neurologic: Alert and oriented x 4.   Grossly nonfocal. GU: Neg  CVAT. Skin:  Warm and Dry Psych:  Affect appropriate.  PELVIC EXAM: Cervix pink, visually closed, without lesion, small amount of blood in vault, no active flow,  vaginal walls and external genitalia normal Bimanual exam: Cervix firm, anterior, neg CMT, uterus nontender, nonenlarged, adnexa without tenderness, enlargement, or mass    Labs: Results for orders placed or performed during the hospital encounter of 04/15/16 (from the past 24 hour(s))  Pregnancy, urine POC     Status: None   Collection Time: 04/15/16  6:33 PM  Result Value Ref Range   Preg Test, Ur NEGATIVE NEGATIVE  CBC     Status: None   Collection Time: 04/15/16  7:04 PM  Result Value Ref Range   WBC 6.0 4.0 - 10.5 K/uL   RBC 4.37 3.87 - 5.11 MIL/uL   Hemoglobin 12.7 12.0 - 15.0 g/dL   HCT 39.3 36.0 - 46.0 %   MCV 89.9 78.0 - 100.0 fL   MCH 29.1 26.0 - 34.0 pg   MCHC 32.3 30.0 - 36.0 g/dL   RDW 15.1 11.5 - 15.5 %   Platelets 276 150 - 400 K/uL      Imaging:  No results found.  MAU Course/MDM: I have ordered labs as follows: CBC, type/screen Imaging ordered: none Results reviewed.   Consult Dr Glo Herring.   States bleeidng is likely from  eschar debriding.  He recommends Methergine to make uterus contract.  Miralax for constip. And Meclizine for vertigo.  Then followup withDr Posey Pronto. .   Treatments in MAU included none.   Pt stable at time of discharge.  Assessment: DUB following ablation No evidence of anemia Constipation Vertigo  Plan: Discharge home Recommend methergine to make uterus contract.  Will give Tramadol for pain.  Constipation tx with Miralax.  Will give Meclizine for vertigo.  Have her followup with Dr Posey Pronto  Rx sent for Methergine for 3 days for bleeding Rx Tramadol for pain Rx Miralax for constipation Rx Meclizine for vertigo   Encouraged to return here or to other Urgent Care/ED if she develops worsening of symptoms, increase in pain, fever, or other concerning symptoms.   Hansel Feinstein CNM, MSN Certified Nurse-Midwife 04/15/2016 7:31 PM

## 2016-04-16 LAB — ABO/RH: ABO/RH(D): A POS

## 2016-05-18 ENCOUNTER — Emergency Department (HOSPITAL_COMMUNITY): Payer: Medicaid Other

## 2016-05-18 ENCOUNTER — Emergency Department (HOSPITAL_COMMUNITY)
Admission: EM | Admit: 2016-05-18 | Discharge: 2016-05-18 | Disposition: A | Discharge status: Home / Self Care | Payer: Medicaid Other | Attending: Emergency Medicine | Admitting: Emergency Medicine

## 2016-05-18 ENCOUNTER — Encounter (HOSPITAL_COMMUNITY): Payer: Self-pay | Admitting: *Deleted

## 2016-05-18 DIAGNOSIS — W19XXXA Unspecified fall, initial encounter: Secondary | ICD-10-CM

## 2016-05-18 DIAGNOSIS — Z79899 Other long term (current) drug therapy: Secondary | ICD-10-CM | POA: Diagnosis not present

## 2016-05-18 DIAGNOSIS — Y999 Unspecified external cause status: Secondary | ICD-10-CM | POA: Insufficient documentation

## 2016-05-18 DIAGNOSIS — J45909 Unspecified asthma, uncomplicated: Secondary | ICD-10-CM | POA: Diagnosis not present

## 2016-05-18 DIAGNOSIS — Y939 Activity, unspecified: Secondary | ICD-10-CM | POA: Insufficient documentation

## 2016-05-18 DIAGNOSIS — Y929 Unspecified place or not applicable: Secondary | ICD-10-CM | POA: Insufficient documentation

## 2016-05-18 DIAGNOSIS — W228XXA Striking against or struck by other objects, initial encounter: Secondary | ICD-10-CM | POA: Diagnosis not present

## 2016-05-18 DIAGNOSIS — M25562 Pain in left knee: Secondary | ICD-10-CM | POA: Insufficient documentation

## 2016-05-18 MED ORDER — TRAMADOL HCL 50 MG PO TABS
50.0000 mg | ORAL_TABLET | Freq: Four times a day (QID) | ORAL | 0 refills | Status: DC | PRN
Start: 2016-05-18 — End: 2017-04-20

## 2016-05-18 MED ORDER — MELOXICAM 15 MG PO TABS: 15.0000 mg | ORAL_TABLET | Freq: Every day | ORAL | 0 refills | Status: DC

## 2016-05-18 NOTE — Progress Notes (Signed)
Orthopedic Tech Progress Note Patient Details:  Jaime Castaneda 30-Mar-1970 [id number]  Ortho Devices Type of Ortho Device: Ace wrap, Crutches Ortho Device/Splint Location: lle Ortho Device/Splint Interventions: Application   [name] 05/18/2016, 11:59 AM Viewed order from doctor's order list

## 2016-05-18 NOTE — Discharge Instructions (Signed)
Get help right away if: °Your knee feels warm to the touch. °You cannot move your knee. °You have severe pain in your knee. °You have chest pain. °You have trouble breathing. °

## 2016-05-18 NOTE — ED Triage Notes (Signed)
Pt reports falling on Saturday, having left knee pain and swelling since.

## 2016-05-18 NOTE — ED Notes (Signed)
Ortho tech at bedside 

## 2016-05-18 NOTE — ED Notes (Signed)
Ortho tech contacted at this time.

## 2016-05-18 NOTE — ED Provider Notes (Signed)
[facility] DEPT Provider Note   [name]: [id number] Arrival date & time: 05/18/16  6073 By signing my name below, I, Jaime Castaneda, attest that this documentation has been prepared under the direction and in the presence of [name], PA-C. Electronically Signed: Georgette Castaneda, ED Scribe. 05/18/16. 11:00 AM.  History   Chief Complaint Chief Complaint  Patient presents with  . Fall  . Knee Pain    HPI The history is provided by the patient. No language interpreter was used.   HPI Comments: Jaime Castaneda is a 46 y.o. female with h/o anemia and asthma, who presents to the Emergency Department complaining of left knee and thigh pain with swelling following mechanical fall 5 days ago. Pain is throbbing in quality. 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 No LOC or head injury. States her knee has been "locking up" since this morning with clicking and catching. She states pain is worsened with movement and bearing weight. Pt has applied ice to the area with no relief. Pt denies fever, chills, focal numbness/weakness, or any additional injuries.  Past Medical History:  Diagnosis Date  . Anemia   . Anxiety   . Asthma   . Fibroid   . Migraine   . Obesity   . TMJ arthralgia     Patient Active Problem List   Diagnosis Date Noted  . Morbid obesity ([place]) 06/27/2015  . Severe headache 06/27/2015  . Vision changes 06/27/2015  . Metabolic syndrome 71/06/2692  . Migraine 06/27/2015    Past Surgical History:  Procedure Laterality Date  . ABLATION     uterine  . SHOULDER SURGERY    . TUBAL LIGATION  2003    OB History    Gravida Para Term Preterm AB Living   2 2 2     2    SAB TAB Ectopic Multiple Live Births                   Home Medications    Prior to Admission medications   Medication Sig Start Date End Date Taking? Authorizing Provider  ALPRAZolam (XANAX) 0.25 MG tablet Take 1 tablet (0.25 mg total) by mouth 3 (three) times  daily as needed for anxiety. 05/21/15   Julianne Rice, MD  ALPRAZolam Duanne Moron) 0.5 MG tablet Prior to MRI 07/06/15   Marcial Pacas, MD  benzonatate (TESSALON) 100 MG capsule Take 1 capsule (100 mg total) by mouth every 8 (eight) hours. 03/22/15   Palumbo, April, MD  [place] 5-2.5-18.5 injection Inject 1 Dose into the muscle once. 05/19/15   [provider]  ferrous sulfate (FERROUSUL) 325 (65 FE) MG tablet Take 1 tablet (325 mg total) by mouth 2 (two) times daily with a meal. 06/28/14   Tamala Julian, [place], CNM  ferrous sulfate (FERROUSUL) 325 (65 FE) MG tablet Take 325 mg by mouth. 06/28/14   [provider]  fluticasone (FLONASE) 50 MCG/ACT nasal spray Place 2 sprays into both nostrils daily. 03/22/15   Palumbo, April, MD  hydrOXYzine (ATARAX/VISTARIL) 25 MG tablet Take 25 mg by mouth 3 (three) times daily as needed. 04/13/15   [provider]  ibuprofen (ADVIL,MOTRIN) 600 MG tablet Take 1 tablet (600 mg total) by mouth every 8 (eight) hours as needed for moderate pain. 05/21/15   Julianne Rice, MD  ipratropium (ATROVENT) 0.06 % nasal spray Place 2 sprays into both nostrils 4 (four) times daily. 01/27/13   Presson, Audelia Hives, PA  ipratropium-albuterol ([place])  0.5-2.5 (3) MG/3ML SOLN 3 mLs as needed. 04/13/15 04/12/16  [provider]  meclizine (ANTIVERT) 32 MG tablet Take 1 tablet (32 mg total) by mouth 3 (three) times daily as needed. 04/15/16   Seabron Spates, CNM  methylergonovine (METHERGINE) 0.2 MG tablet Take 1 tablet (0.2 mg total) by mouth every 8 (eight) hours. 04/15/16   Seabron Spates, CNM  metoCLOPramide (REGLAN) 10 MG tablet Take 1 tablet (10 mg total) by mouth every 6 (six) hours as needed for nausea (headache). 05/21/15   Julianne Rice, MD  naproxen (NAPROSYN) 500 MG tablet Take 1 tablet (500 mg total) by mouth 2 (two) times daily. 03/22/15   Palumbo, April, MD  oxyCODONE-acetaminophen (PERCOCET) 10-325 MG tablet Take 1 tablet by mouth every 6 (six)  hours as needed for pain. 06/25/15   Melvenia Beam, MD  polyethylene glycol [facility]) packet Take 17 g by mouth daily. 04/15/16   Seabron Spates, CNM  traMADol (ULTRAM) 50 MG tablet Take 1 tablet (50 mg total) by mouth every 6 (six) hours as needed. 04/15/16   Seabron Spates, CNM    Family History Family History  Problem Relation Age of Onset  . Heart disease Unknown   . Thyroid disease Unknown   . Bone cancer Unknown   . High blood pressure Unknown   . Migraines Neg Hx     Social History Social History  Substance Use Topics  . Smoking status: Never Smoker  . Smokeless tobacco: Never Used  . Alcohol use No     Allergies   Cephalexin and Moxifloxacin   Review of Systems Review of Systems  Constitutional: Negative for chills and fever.  Musculoskeletal: Positive for arthralgias, joint swelling and myalgias.  Neurological: Negative for syncope, weakness and numbness.   Physical Exam Updated Vital Signs BP (!) 159/90 (BP Location: Left Arm)   Pulse 83   Temp 98 F (36.7 C) (Oral)   Resp 20   SpO2 97%   Physical Exam  Constitutional: She appears well-developed and well-nourished.  HENT:  Head: Normocephalic.  Eyes: Conjunctivae are normal.  Cardiovascular: Normal rate.   Pulmonary/Chest: Effort normal. No respiratory distress.  Abdominal: She exhibits no distension.  Musculoskeletal: Normal range of motion. She exhibits tenderness.  Exam is limited due to pt's body habitus. No point tenderness over the patella. Pain with flexion and extension of the left knee. More tender over the lateral side of the knee and lateral thigh. Ligaments are stable, no crepitus. Pt is toe-touch ambulating.  Neurological: She is alert.  Skin: Skin is warm and dry.  Psychiatric: She has a normal mood and affect. Her behavior is normal.  Nursing note and vitals reviewed.    ED Treatments / Results  DIAGNOSTIC STUDIES: Oxygen Saturation is 97% on RA, adequate by my  interpretation.   COORDINATION OF CARE: 11:00 AM-Discussed next steps with pt. Pt verbalized understanding and is agreeable with the plan.   Labs (all labs ordered are listed, but only abnormal results are displayed) Labs Reviewed - No data to display  EKG  EKG Interpretation None       Radiology Dg Knee Complete 4 Views Left  Result Date: 05/18/2016 CLINICAL DATA:  Fall 2 days ago on left knee with pain and swelling. Initial encounter. EXAM: LEFT KNEE - COMPLETE 4+ VIEW COMPARISON:  None. FINDINGS: No evidence of fracture, dislocation, or joint effusion. 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 IMPRESSION: 1. No acute finding. 2.  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 Electronically Signed   By: Monte Fantasia M.D.   On: 05/18/2016 10:00    Procedures Procedures (including critical care time)  Medications Ordered in ED Medications - No data to display   Initial Impression / Assessment and Plan / ED Course  I have reviewed the triage vital signs and the nursing notes.  Pertinent labs & imaging results that were available during my care of the patient were reviewed by me and considered in my medical decision making (see chart for details).     Patient X-Ray negative for obvious fracture or dislocation. Pain managed in ED. Pt advised to follow up with orthopedics if symptoms persist for possibility of missed fracture diagnosis. Patient given brace while in ED, conservative therapy recommended and discussed. Patient will be dc home & is agreeable with above plan.  I personally performed the services described in this documentation, which was scribed in my presence. The recorded information has been reviewed and is accurate.     Final Clinical Impressions(s) / ED Diagnoses   Final diagnoses:  Fall, initial encounter  Acute pain of left knee    New Prescriptions New  Prescriptions   No medications on file     [name], PA-C 05/18/16 1118    Orlie Dakin, MD 05/18/16 1753

## 2016-09-16 DIAGNOSIS — B20 Human immunodeficiency virus [HIV] disease: Secondary | ICD-10-CM | POA: Insufficient documentation

## 2016-09-16 DIAGNOSIS — H15009 Unspecified scleritis, unspecified eye: Secondary | ICD-10-CM | POA: Insufficient documentation

## 2016-09-16 DIAGNOSIS — Z8669 Personal history of other diseases of the nervous system and sense organs: Secondary | ICD-10-CM | POA: Insufficient documentation

## 2017-04-19 ENCOUNTER — Encounter (HOSPITAL_COMMUNITY): Payer: Self-pay | Admitting: *Deleted

## 2017-04-19 ENCOUNTER — Emergency Department (HOSPITAL_COMMUNITY)
Admission: EM | Admit: 2017-04-19 | Discharge: 2017-04-20 | Disposition: A | Discharge status: Home / Self Care | Payer: Medicaid Other | Attending: Emergency Medicine | Admitting: Emergency Medicine

## 2017-04-19 ENCOUNTER — Other Ambulatory Visit: Payer: Self-pay

## 2017-04-19 DIAGNOSIS — Z79899 Other long term (current) drug therapy: Secondary | ICD-10-CM | POA: Insufficient documentation

## 2017-04-19 DIAGNOSIS — M542 Cervicalgia: Secondary | ICD-10-CM | POA: Diagnosis not present

## 2017-04-19 DIAGNOSIS — J45909 Unspecified asthma, uncomplicated: Secondary | ICD-10-CM | POA: Insufficient documentation

## 2017-04-19 DIAGNOSIS — M25532 Pain in left wrist: Secondary | ICD-10-CM | POA: Diagnosis present

## 2017-04-19 DIAGNOSIS — W19XXXA Unspecified fall, initial encounter: Secondary | ICD-10-CM

## 2017-04-19 NOTE — ED Triage Notes (Signed)
The pt fell yesterday and was seen at [facility] she is c/o pain across her neck and shoulders and her lt forearm has a splint on it  xrayed showed no fractures

## 2017-04-20 MED ORDER — TRAMADOL HCL 50 MG PO TABS: 50.0000 mg | ORAL_TABLET | Freq: Two times a day (BID) | ORAL | 0 refills | Status: DC | PRN

## 2017-04-20 MED ORDER — TRAMADOL HCL 50 MG PO TABS
50.0000 mg | ORAL_TABLET | Freq: Once | ORAL | Status: AC
  Administered 2017-04-20: 50 mg via ORAL
  Filled 2017-04-20: qty 1

## 2017-04-20 MED ORDER — CYCLOBENZAPRINE HCL 10 MG PO TABS: 10.0000 mg | ORAL_TABLET | Freq: Two times a day (BID) | ORAL | 0 refills | Status: DC | PRN

## 2017-04-20 NOTE — ED Provider Notes (Signed)
[facility] EMERGENCY DEPARTMENT Provider Note   [name]: [id number] Arrival date & time: 04/19/17  2245     History   Chief Complaint Chief Complaint  Patient presents with  . Wrist Pain    HPI Jaime Castaneda is a 47 y.o. female with history of anxiety, asthma, migraines, obesity presents for evaluation of gradual onset, progressively worsening neck pain and left wrist pain since yesterday.  Patient states that yesterday at around 11 AM she walked into a building and missed stepped over an uneven area, falling on both outstretched hands.  She states that her left upper extremity took most of the force.  She notes that her neck "whipped back "and she denies head injury or loss of consciousness.  She went to urgent care yesterday and underwent right wrist x-ray which was negative.  She was given conservative therapy and a Velcro wrist splint.  She notes a constant sharp pain to the radial aspect of the left wrist which radiates into the thumb.  She also notes numbness and tingling of the third, fourth, and fifth digits.  Pain worsens with any movement and palpation of the wrist.  She also notes a sharp pain to the neck bilaterally which radiates up into the occipital region.  She states that this area feels "tight ".  She denies fevers or chills.  She also notes aggravation of her chronic sciatica with left-sided low back pain.  Denies numbness, tingling, fevers, saddle anesthesia, bowel or bladder incontinence.  She has tried 800 mg of ibuprofen every 6 hours without relief of her symptoms.  The history is provided by the patient.    Past Medical History:  Diagnosis Date  . Anemia   . Anxiety   . Asthma   . Fibroid   . Migraine   . Obesity   . TMJ arthralgia     Patient Active Problem List   Diagnosis Date Noted  . Morbid obesity ([place]) 06/27/2015  . Severe headache 06/27/2015  . Vision changes 06/27/2015  . Metabolic syndrome 46/56/8127  . Migraine 06/27/2015     Past Surgical History:  Procedure Laterality Date  . ABLATION     uterine  . SHOULDER SURGERY    . TUBAL LIGATION  2003     OB History    Gravida  2   Para  2   Term  2   Preterm      AB      Living  2     SAB      TAB      Ectopic      Multiple      Live Births               Home Medications    Prior to Admission medications   Medication Sig Start Date End Date Taking? Authorizing Provider  ALPRAZolam (XANAX) 0.25 MG tablet Take 1 tablet (0.25 mg total) by mouth 3 (three) times daily as needed for anxiety. 05/21/15   Julianne Rice, MD  ALPRAZolam Duanne Moron) 0.5 MG tablet Prior to MRI 07/06/15   Marcial Pacas, MD  benzonatate (TESSALON) 100 MG capsule Take 1 capsule (100 mg total) by mouth every 8 (eight) hours. 03/22/15   Palumbo, April, MD  [place] 5-2.5-18.5 injection Inject 1 Dose into the muscle once. 05/19/15   [provider]  cyclobenzaprine (FLEXERIL) 10 MG tablet Take 1 tablet (10 mg total) by mouth 2 (two) times daily as needed for muscle spasms. 04/20/17  Nils Flack, Keishla Oyer A, PA-C  ferrous sulfate (FERROUSUL) 325 (65 FE) MG tablet Take 1 tablet (325 mg total) by mouth 2 (two) times daily with a meal. 06/28/14   Tamala Julian, [place], CNM  ferrous sulfate (FERROUSUL) 325 (65 FE) MG tablet Take 325 mg by mouth. 06/28/14   [provider]  fluticasone (FLONASE) 50 MCG/ACT nasal spray Place 2 sprays into both nostrils daily. 03/22/15   Palumbo, April, MD  hydrOXYzine (ATARAX/VISTARIL) 25 MG tablet Take 25 mg by mouth 3 (three) times daily as needed. 04/13/15   [provider]  ibuprofen (ADVIL,MOTRIN) 600 MG tablet Take 1 tablet (600 mg total) by mouth every 8 (eight) hours as needed for moderate pain. 05/21/15   Julianne Rice, MD  ipratropium (ATROVENT) 0.06 % nasal spray Place 2 sprays into both nostrils 4 (four) times daily. 01/27/13   Presson, Annett Gula H, PA  ipratropium-albuterol (DUONEB) 0.5-2.5 (3) MG/3ML SOLN 3 mLs as needed. 04/13/15  04/12/16  [provider]  meclizine (ANTIVERT) 32 MG tablet Take 1 tablet (32 mg total) by mouth 3 (three) times daily as needed. 04/15/16   Seabron Spates, CNM  meloxicam (MOBIC) 15 MG tablet Take 1 tablet (15 mg total) by mouth daily. 05/18/16   [name], PA-C  methylergonovine (METHERGINE) 0.2 MG tablet Take 1 tablet (0.2 mg total) by mouth every 8 (eight) hours. 04/15/16   Seabron Spates, CNM  metoCLOPramide (REGLAN) 10 MG tablet Take 1 tablet (10 mg total) by mouth every 6 (six) hours as needed for nausea (headache). 05/21/15   Julianne Rice, MD  naproxen (NAPROSYN) 500 MG tablet Take 1 tablet (500 mg total) by mouth 2 (two) times daily. 03/22/15   Palumbo, April, MD  oxyCODONE-acetaminophen (PERCOCET) 10-325 MG tablet Take 1 tablet by mouth every 6 (six) hours as needed for pain. 06/25/15   Melvenia Beam, MD  polyethylene glycol [facility]) packet Take 17 g by mouth daily. 04/15/16   Seabron Spates, CNM  traMADol (ULTRAM) 50 MG tablet Take 1 tablet (50 mg total) by mouth every 12 (twelve) hours as needed. 04/20/17   Renita Papa, PA-C    Family History Family History  Problem Relation Age of Onset  . Heart disease Unknown   . Thyroid disease Unknown   . Bone cancer Unknown   . High blood pressure Unknown   . Migraines Neg Hx     Social History Social History   Tobacco Use  . Smoking status: Never Smoker  . Smokeless tobacco: Never Used  Substance Use Topics  . Alcohol use: No  . Drug use: No     Allergies   Cephalexin and Moxifloxacin   Review of Systems Review of Systems  Constitutional: Negative for chills and fever.  Respiratory: Negative for shortness of breath.   Cardiovascular: Negative for chest pain.  Gastrointestinal: Negative for nausea and vomiting.  Musculoskeletal: Positive for arthralgias and back pain.  Neurological: Positive for numbness. Negative for syncope, weakness and headaches.     Physical Exam Updated Vital Signs BP  120/78   Pulse 70   Temp 97.9 F (36.6 C) (Oral)   Resp 16   Ht 5\' 7"  (1.702 m)   Wt (!) 144.7 kg (319 lb)   SpO2 100%   BMI 49.96 kg/m   Physical Exam  Constitutional: She appears well-developed and well-nourished. No distress.  HENT:  Head: Normocephalic and atraumatic.  Eyes: Conjunctivae are normal. Right eye exhibits no discharge. Left eye exhibits no discharge.  Neck: Normal range of motion. Neck supple. No JVD present. No tracheal deviation present.  No focal midline spine tenderness, bilateral paraspinal muscle tenderness and spasm noted overlying the trapezius and splenius capitis muscles.  No deformity, crepitus, or step-off noted.  Pain elicited with lateral rotation of the cervical spine.  Cardiovascular: Normal rate and intact distal pulses.  2+ radial and DP/PT pulses bl, negative Homan's bl  Pulmonary/Chest: Effort normal.  Abdominal: She exhibits no distension.  Musculoskeletal: She exhibits no edema.  No midline thoracic or lumbar spine tenderness, left paralumbar muscle tenderness and left SI joint tenderness noted.  No deformity, crepitus, or step-off noted.  5/5 strength of BUE and BLE major muscle groups including good grip strength bilaterally.  There is no significant ecchymosis or swelling to the left hand or wrist.  Patient is maximally tender overlying the left anatomical snuffbox.  5/5 strength of wrist and digits with flexion and extension against resistance.  Neurological: She is alert.  Fluent speech with no evidence of dysarthria or aphasia, no facial droop, sensation intact to soft touch of bilateral upper and lower extremities aside from slightly altered/decreased sensation of the left third, fourth, and fifth digits.  Mildly antalgic gait, but patient exhibits good balance.  Skin: Skin is warm and dry. No erythema.  Psychiatric: She has a normal mood and affect. Her behavior is normal.  Nursing note and vitals reviewed.    ED Treatments / Results    Labs (all labs ordered are listed, but only abnormal results are displayed) Labs Reviewed - No data to display  EKG None  Radiology No results found.  Procedures Procedures (including critical care time)  Medications Ordered in ED Medications  traMADol (ULTRAM) tablet 50 mg (50 mg Oral Given 04/20/17 0049)     Initial Impression / Assessment and Plan / ED Course  I have reviewed the triage vital signs and the nursing notes.  Pertinent labs & imaging results that were available during my care of the patient were reviewed by me and considered in my medical decision making (see chart for details).     Patient presents after fall on outstretched hand yesterday.  No head injury or loss of consciousness.  She has since developed bilateral neck pain but no midline spine tenderness.  She is afebrile, vital signs are stable.  No focal neuro deficits other than some numbness and tingling of the left third, fourth, and fifth digits.  X-rays yesterday at urgent care showed no acute osseous abnormalities.  However with snuffbox tenderness, we will treat for presumed/possible scaphoid fracture.  She is ambulatory without difficulty.  No red flag signs concerning for cauda equina or spinal abscess.  No evidence of gout, DVT, osteomyelitis, or compartment syndrome.  She will follow-up with hand surgery for reevaluation and sees Raliegh Ip for her orthopedic complaints.  She has a thumb spica splint in place already.  Will discharge with small amount of pain medicine and muscle relaxant, advised patient of appropriate use of this medicine.  Controlled substance registry shows no recent prescriptions for controlled substances since 2018.  Discussed strict ED return precautions. Pt verbalized understanding of and agreement with plan and is safe for discharge home at this time.  No complaints prior to discharge.  Final Clinical Impressions(s) / ED Diagnoses   Final diagnoses:  Left wrist pain  Neck  pain, acute  Fall, initial encounter    ED Discharge Orders        Ordered    traMADol (  ULTRAM) 50 MG tablet  Every 12 hours PRN     04/20/17 0026    cyclobenzaprine (FLEXERIL) 10 MG tablet  2 times daily PRN     04/20/17 0026       Renita Papa, PA-C 04/20/17 1409    Ripley Fraise, MD 04/20/17 2310

## 2017-04-20 NOTE — Discharge Instructions (Signed)
Alternate 600 mg of ibuprofen and [phone] mg of Tylenol every 3 hours as needed for pain. Do not exceed 4000 mg of Tylenol daily.  Take ibuprofen with food to avoid upset stomach issues.  You may take tramadol as needed for severe pain but do not drive, drink alcohol, or operate heavy machinery while on this medication.  You may also take Flexeril as needed for muscle spasm but also do not drive, drink alcohol, or operate heavy machinery on this medicine as it may make you drowsy.  Take hot showers and hot baths to avoid muscle stiffness, do frequent gentle stretching exercises of the neck.  Wear the wrist splint at all times except to shower.  Follow-up with the hand specialist at [facility] Dr. Mardelle Matte for reevaluation and possible repeat x-ray to rule out scaphoid fracture.  Return to the emergency department if any concerning signs or symptoms develop.

## 2017-09-26 DIAGNOSIS — H35033 Hypertensive retinopathy, bilateral: Secondary | ICD-10-CM | POA: Insufficient documentation

## 2017-09-26 DIAGNOSIS — H2513 Age-related nuclear cataract, bilateral: Secondary | ICD-10-CM | POA: Insufficient documentation

## 2018-11-13 ENCOUNTER — Emergency Department (HOSPITAL_BASED_OUTPATIENT_CLINIC_OR_DEPARTMENT_OTHER)
Admission: EM | Admit: 2018-11-13 | Discharge: 2018-11-13 | Disposition: A | Discharge status: Home / Self Care | Payer: Medicaid Other | Attending: Emergency Medicine | Admitting: Emergency Medicine

## 2018-11-13 ENCOUNTER — Emergency Department (HOSPITAL_BASED_OUTPATIENT_CLINIC_OR_DEPARTMENT_OTHER): Payer: Medicaid Other

## 2018-11-13 ENCOUNTER — Emergency Department (HOSPITAL_COMMUNITY): Payer: Medicaid Other

## 2018-11-13 ENCOUNTER — Encounter (HOSPITAL_BASED_OUTPATIENT_CLINIC_OR_DEPARTMENT_OTHER): Payer: Self-pay | Admitting: Emergency Medicine

## 2018-11-13 ENCOUNTER — Other Ambulatory Visit: Payer: Self-pay

## 2018-11-13 DIAGNOSIS — K209 Esophagitis, unspecified without bleeding: Secondary | ICD-10-CM | POA: Insufficient documentation

## 2018-11-13 DIAGNOSIS — R0602 Shortness of breath: Secondary | ICD-10-CM | POA: Diagnosis not present

## 2018-11-13 DIAGNOSIS — Z20828 Contact with and (suspected) exposure to other viral communicable diseases: Secondary | ICD-10-CM | POA: Insufficient documentation

## 2018-11-13 DIAGNOSIS — Z79899 Other long term (current) drug therapy: Secondary | ICD-10-CM | POA: Diagnosis not present

## 2018-11-13 DIAGNOSIS — J45909 Unspecified asthma, uncomplicated: Secondary | ICD-10-CM | POA: Diagnosis not present

## 2018-11-13 DIAGNOSIS — R1310 Dysphagia, unspecified: Secondary | ICD-10-CM | POA: Insufficient documentation

## 2018-11-13 DIAGNOSIS — R0989 Other specified symptoms and signs involving the circulatory and respiratory systems: Secondary | ICD-10-CM | POA: Diagnosis present

## 2018-11-13 LAB — CBC WITH DIFFERENTIAL/PLATELET
Abs Immature Granulocytes: 0.01 10*3/uL (ref 0.00–0.07)
Basophils Absolute: 0 10*3/uL (ref 0.0–0.1)
Basophils Relative: 1 %
Eosinophils Absolute: 0 10*3/uL (ref 0.0–0.5)
Eosinophils Relative: 1 %
HCT: 37.8 % (ref 36.0–46.0)
Hemoglobin: 11.2 g/dL — ABNORMAL LOW (ref 12.0–15.0)
Immature Granulocytes: 0 %
Lymphocytes Relative: 34 %
Lymphs Abs: 2.2 10*3/uL (ref 0.7–4.0)
MCH: 25.9 pg — ABNORMAL LOW (ref 26.0–34.0)
MCHC: 29.6 g/dL — ABNORMAL LOW (ref 30.0–36.0)
MCV: 87.5 fL (ref 80.0–100.0)
Monocytes Absolute: 0.3 10*3/uL (ref 0.1–1.0)
Monocytes Relative: 5 %
Neutro Abs: 3.7 10*3/uL (ref 1.7–7.7)
Neutrophils Relative %: 59 %
Platelets: 316 10*3/uL (ref 150–400)
RBC: 4.32 MIL/uL (ref 3.87–5.11)
RDW: 14.4 % (ref 11.5–15.5)
WBC: 6.3 10*3/uL (ref 4.0–10.5)
nRBC: 0 % (ref 0.0–0.2)

## 2018-11-13 LAB — BASIC METABOLIC PANEL
Anion gap: 10 (ref 5–15)
BUN: 6 mg/dL (ref 6–20)
CO2: 25 mmol/L (ref 22–32)
Calcium: 8.5 mg/dL — ABNORMAL LOW (ref 8.9–10.3)
Chloride: 100 mmol/L (ref 98–111)
Creatinine, Ser: 0.59 mg/dL (ref 0.44–1.00)
GFR calc Af Amer: >60 mL/min (ref >60)
GFR calc non Af Amer: >60 mL/min (ref >60)
Glucose, Bld: 169 mg/dL — ABNORMAL HIGH (ref 70–99)
Potassium: 3.5 mmol/L (ref 3.5–5.1)
Sodium: 135 mmol/L (ref 135–145)

## 2018-11-13 LAB — SARS CORONAVIRUS 2 (TAT 6-24 HRS): SARS Coronavirus 2: NEGATIVE

## 2018-11-13 LAB — BRAIN NATRIURETIC PEPTIDE: B Natriuretic Peptide: 17.6 pg/mL (ref 0.0–100.0)

## 2018-11-13 MED ORDER — GLUCAGON HCL RDNA (DIAGNOSTIC) 1 MG IJ SOLR
2.0000 mg | Freq: Once | INTRAMUSCULAR | Status: AC
  Administered 2018-11-13: 2 mg via INTRAVENOUS
  Filled 2018-11-13: qty 2

## 2018-11-13 MED ORDER — FAMOTIDINE 20 MG PO TABS: 20.0000 mg | ORAL_TABLET | Freq: Two times a day (BID) | ORAL | 0 refills | Status: DC

## 2018-11-13 MED ORDER — PANTOPRAZOLE SODIUM 40 MG IV SOLR
40.0000 mg | Freq: Once | INTRAVENOUS | Status: AC
  Administered 2018-11-13: 40 mg via INTRAVENOUS
  Filled 2018-11-13: qty 40

## 2018-11-13 MED ORDER — SUCRALFATE 1 G PO TABS: 1.0000 g | ORAL_TABLET | Freq: Four times a day (QID) | ORAL | 0 refills | Status: DC

## 2018-11-13 NOTE — ED Notes (Signed)
Radiology made aware of pt arrival and need for imaging

## 2018-11-13 NOTE — Consult Note (Signed)
Referring Provider: Dr. Melina Copa EDP Primary Care Physician:  [facility] Physical Medicine & Primary Gastroenterologist: [facility] GI  Reason for Consultation: Dysphagia  HPI: Jaime Castaneda is a 48 y.o. female transferred from [place] center to [facility] for further evaluation of dysphagia.  Patient presented to [facility] with sensation of foreign body in the esophagus for last 4 days.  She was eating shrimp Alfredo 4 days ago with sensation of food getting stuck in the esophagus.  She is able to eat small amount of food and able to drink liquid.  GI is consulted for further evaluation.  Patient seen and examined at bedside.  She is complaining of substernal pain with cough but denies any pain while swallowing.  Able to swallow food but complaining of sensation of food getting stuck in esophagus.  Complaining of acid reflux for last 2 days.  Denied any trouble swallowing food prior to this episode.  Denies any blood in the stool or black stool.  She is scheduled for colonoscopy with [facility] GI next week.  Past Medical History:  Diagnosis Date  . Anemia   . Anxiety   . Asthma   . Fibroid   . Migraine   . Obesity   . TMJ arthralgia     Past Surgical History:  Procedure Laterality Date  . ABLATION     uterine  . SHOULDER SURGERY    . TUBAL LIGATION  2003    Prior to Admission medications   Medication Sig Start Date End Date Taking? Authorizing Provider  ALPRAZolam (XANAX) 0.25 MG tablet Take 1 tablet (0.25 mg total) by mouth 3 (three) times daily as needed for anxiety. 05/21/15   Julianne Rice, MD  ALPRAZolam Duanne Moron) 0.5 MG tablet Prior to MRI 07/06/15   Marcial Pacas, MD  benzonatate (TESSALON) 100 MG capsule Take 1 capsule (100 mg total) by mouth every 8 (eight) hours. 03/22/15   Palumbo, April, MD  [place] 5-2.5-18.5 injection Inject 1 Dose into the muscle once. 05/19/15   [provider]  cyclobenzaprine (FLEXERIL) 10 MG tablet Take 1 tablet (10 mg total) by mouth 2 (two) times daily as needed for muscle spasms. 04/20/17   Nils Flack, Mina A, PA-C  ferrous sulfate (FERROUSUL) 325 (65 FE) MG tablet Take 1 tablet (325 mg total) by mouth 2 (two) times daily with a meal. 06/28/14   Tamala Julian, [place], CNM  ferrous sulfate (FERROUSUL) 325 (65 FE) MG tablet Take 325 mg by mouth. 06/28/14   [provider]  fluticasone (FLONASE) 50 MCG/ACT nasal spray Place 2 sprays into both nostrils daily. 03/22/15   Palumbo, April, MD  hydrOXYzine (ATARAX/VISTARIL) 25 MG tablet Take 25 mg by mouth 3 (three) times daily as needed. 04/13/15   [provider]  ibuprofen (ADVIL,MOTRIN) 600 MG tablet Take 1 tablet (600 mg total) by mouth every 8 (eight) hours as needed for moderate pain. 05/21/15   Julianne Rice, MD  ipratropium (ATROVENT) 0.06 % nasal spray Place 2 sprays into both nostrils 4 (four) times daily. 01/27/13   Presson, Annett Gula H, PA  ipratropium-albuterol (DUONEB) 0.5-2.5 (3) MG/3ML SOLN 3 mLs as needed. 04/13/15 04/12/16  [provider]  meclizine (ANTIVERT) 32 MG tablet Take 1 tablet (32 mg total) by mouth 3 (three) times daily as needed. 04/15/16   Seabron Spates, CNM  meloxicam (MOBIC) 15 MG tablet Take 1 tablet (15 mg total) by mouth daily. 05/18/16   [name],  PA-C  methylergonovine (METHERGINE) 0.2 MG tablet Take 1 tablet (0.2 mg total) by mouth every 8 (eight) hours. 04/15/16   Seabron Spates, CNM  metoCLOPramide (REGLAN) 10 MG tablet Take 1 tablet (10 mg total) by mouth every 6 (six) hours as needed for nausea (headache). 05/21/15   Julianne Rice, MD  naproxen (NAPROSYN) 500 MG tablet Take 1 tablet (500 mg total) by mouth 2 (two) times daily. 03/22/15   Palumbo, April, MD  oxyCODONE-acetaminophen (PERCOCET) 10-325 MG tablet Take 1 tablet by mouth every 6 (six) hours as needed for pain. 06/25/15   Melvenia Beam, MD  polyethylene glycol  [facility]) packet Take 17 g by mouth daily. 04/15/16   Seabron Spates, CNM  traMADol (ULTRAM) 50 MG tablet Take 1 tablet (50 mg total) by mouth every 12 (twelve) hours as needed. 04/20/17   Fawze, Mina A, PA-C    Scheduled Meds: Continuous Infusions: PRN Meds:.  Allergies as of 11/13/2018 - Review Complete 11/13/2018  Allergen Reaction Noted  . Cephalexin  12/11/2014  . Moxifloxacin  12/11/2014    Family History  Problem Relation Age of Onset  . Heart disease Other   . Thyroid disease Other   . Bone cancer Other   . High blood pressure Other   . Migraines Neg Hx     Social History   Socioeconomic History  . Marital status: Married    Spouse name: [name]  . Number of children: 2  . Years of education: 12+  . Highest education level: Not on file  Occupational History  . Occupation: [employer]  . Financial resource strain: Not on file  . Food insecurity    Worry: Not on file    Inability: Not on file  . Transportation needs    Medical: Not on file    Non-medical: Not on file  Tobacco Use  . Smoking status: Never Smoker  . Smokeless tobacco: Never Used  Substance and Sexual Activity  . Alcohol use: No  . Drug use: No  . Sexual activity: Not on file  Lifestyle  . Physical activity    Days per week: Not on file    Minutes per session: Not on file  . Stress: Not on file  Relationships  . Social Herbalist on phone: Not on file    Gets together: Not on file    Attends religious service: Not on file    Active member of club or organization: Not on file    Attends meetings of clubs or organizations: Not on file    Relationship status: Not on file  . Intimate partner violence    Fear of current or ex partner: Not on file    Emotionally abused: Not on file    Physically abused: Not on file    Forced sexual activity: Not on file  Other Topics Concern  . Not on file  Social History Narrative   Lives with family   Caffeine  use: none    Review of Systems: Review of Systems  Constitutional: Negative for chills and fever.  HENT: Negative for hearing loss and tinnitus.   Eyes: Negative for blurred vision and double vision.  Respiratory: Positive for cough and shortness of breath. Negative for hemoptysis and sputum production.   Cardiovascular: Negative for chest pain and palpitations.  Gastrointestinal: Positive for heartburn and nausea. Negative for abdominal pain, blood in stool, constipation, diarrhea and melena.  Genitourinary: Negative for dysuria  and urgency.  Musculoskeletal: Negative for myalgias and neck pain.  Skin: Negative for itching and rash.  Neurological: Negative for seizures and loss of consciousness.  Endo/Heme/Allergies: Does not bruise/bleed easily.  Psychiatric/Behavioral: Negative for depression. The patient is not nervous/anxious.     Physical Exam: Vital signs: Vitals:   11/13/18 0830 11/13/18 0945  BP: 123/82 (!) 160/92  Pulse: 77 90  Resp: (!) 21 18  Temp:    SpO2: 100% 100%     Physical Exam  Constitutional: She is oriented to person, place, and time. She appears well-developed and well-nourished. No distress.  HENT:  Head: Normocephalic and atraumatic.  Eyes: EOM are normal.  Neck: Neck supple.  Cardiovascular: Normal rate, regular rhythm and normal heart sounds.  Pulmonary/Chest: Effort normal. No respiratory distress.  Abdominal: Soft. Bowel sounds are normal. She exhibits no distension. There is no abdominal tenderness. There is no rebound and no guarding.  Musculoskeletal: Normal range of motion.        General: No edema.  Neurological: She is alert and oriented to person, place, and time.  Skin: Skin is warm. No erythema.  Psychiatric: She has a normal mood and affect. Judgment and thought content normal.  Vitals reviewed.   GI:  Lab Results: Recent Labs    11/13/18 0723  WBC 6.3  HGB 11.2*  HCT 37.8  PLT 316   BMET Recent Labs    11/13/18 0723   NA 135  K 3.5  CL 100  CO2 25  GLUCOSE 169*  BUN 6  CREATININE 0.59  CALCIUM 8.5*   LFT No results for input(s): PROT, ALBUMIN, AST, ALT, ALKPHOS, BILITOT, BILIDIR, IBILI in the last 72 hours. PT/INR No results for input(s): LABPROT, INR in the last 72 hours.   Studies/Results: Dg Chest Port 1 View  Result Date: 11/13/2018 CLINICAL DATA:  Shortness of breath. EXAM: PORTABLE CHEST 1 VIEW COMPARISON:  03/21/2015. FINDINGS: Mediastinum and hilar structures normal. Low lung volumes with mild bibasilar atelectasis. No pleural effusion or pneumothorax. IMPRESSION: Low lung volumes with mild bibasilar atelectasis. Exam otherwise unremarkable. Electronically Signed   By: Marcello Moores  Register   On: 11/13/2018 07:25    Impression/Plan: -Esophageal dysphagia with sensation of food getting stuck in the esophagus.  Able to tolerate liquid and soft food. -GERD  Recommendations ------------------------- -Check barium swallow to rule out food impaction.  If negative, she was advised to contact her primary GI to arrange for EGD.  She is scheduled for colonoscopy next week with her primary GI at [facility]. -Barium swallow positive for food impaction, we will arrange for EGD  -Give 1 dose of IV Protonix 40 mg .  Keep n.p.o. for now -Awaiting Covid test.    LOS: 0 days   Otis Brace  MD, FACP 11/13/2018, 10:34 AM  Contact #  [phone]

## 2018-11-13 NOTE — ED Provider Notes (Addendum)
Patient transferred here for management [place] to be seen by GI for barium swallow and possible endoscopy.  She has no complaints at this time.  Awaiting results of studies   3:22 PM With barium swallow which shows no evidence of filling defect.  Likely esophagitis will place on Carafate and PPI and discharge Jaime Leigh, MD 11/13/18 [id number]    Jaime Leigh, MD 11/13/18 [phone]

## 2018-11-13 NOTE — ED Provider Notes (Signed)
[facility] EMERGENCY DEPARTMENT Provider Note   [name]: [id number] Arrival date & time: 11/13/18  [id number]     History   Chief Complaint Chief Complaint  Patient presents with  . Shortness of Breath    HPI Jaime Castaneda is a 48 y.o. female.  She is complaining of a foreign Castaneda sensation in her esophagus that is been there for 4 days.  She said she was eating shrimp [place] and feels like something lodged under her sternum.  She is taking very little food since then and small bites and she feels like it keeps packing in the same area.  She swallowing liquids without any difficulty.  She also feels the discomfort when she coughs.  She is also been more short of breath over the past few days and needing to lie down more upright than normal using pillows.  No fevers no sore throat nausea vomiting diarrhea.  No prior history of food impaction or needing endoscopy.     The history is provided by the patient.  Shortness of Breath Severity:  Moderate Onset quality:  Gradual Timing:  Constant Progression:  Unchanged Chronicity:  New Relieved by:  Nothing Worsened by:  Activity Ineffective treatments:  None tried Associated symptoms: chest pain and cough   Associated symptoms: no abdominal pain, no diaphoresis, no fever, no headaches, no hemoptysis, no neck pain, no rash, no sore throat, no sputum production, no vomiting and no wheezing     Past Medical History:  Diagnosis Date  . Anemia   . Anxiety   . Asthma   . Fibroid   . Migraine   . Obesity   . TMJ arthralgia     Patient Active Problem List   Diagnosis Date Noted  . Morbid obesity ([place]) 06/27/2015  . Severe headache 06/27/2015  . Vision changes 06/27/2015  . Metabolic syndrome [id number]  . Migraine 06/27/2015    Past Surgical History:  Procedure Laterality Date  . ABLATION     uterine  . SHOULDER SURGERY    . TUBAL LIGATION  2003     OB History    Gravida  2   Para  2   Term  2   Preterm       AB      Living  2     SAB      TAB      Ectopic      Multiple      Live Births               Home Medications    Prior to Admission medications   Medication Sig Start Date End Date Taking? Authorizing Provider  ALPRAZolam (XANAX) 0.25 MG tablet Take 1 tablet (0.25 mg total) by mouth 3 (three) times daily as needed for anxiety. 05/21/15   Julianne Rice, MD  ALPRAZolam Duanne Moron) 0.5 MG tablet Prior to MRI 07/06/15   Marcial Pacas, MD  benzonatate (TESSALON) 100 MG capsule Take 1 capsule (100 mg total) by mouth every 8 (eight) hours. 03/22/15   Palumbo, April, MD  [place] 5-2.5-18.5 injection Inject 1 Dose into the muscle once. 05/19/15   [provider]  cyclobenzaprine (FLEXERIL) 10 MG tablet Take 1 tablet (10 mg total) by mouth 2 (two) times daily as needed for muscle spasms. 04/20/17   Nils Flack, Mina A, PA-C  ferrous sulfate (FERROUSUL) 325 (65 FE) MG tablet Take 1 tablet (325 mg total) by mouth 2 (two) times daily with a meal. 06/28/14  Tamala Julian, [place], CNM  ferrous sulfate (FERROUSUL) 325 (65 FE) MG tablet Take 325 mg by mouth. 06/28/14   [provider]  fluticasone (FLONASE) 50 MCG/ACT nasal spray Place 2 sprays into both nostrils daily. 03/22/15   Palumbo, April, MD  hydrOXYzine (ATARAX/VISTARIL) 25 MG tablet Take 25 mg by mouth 3 (three) times daily as needed. 04/13/15   [provider]  ibuprofen (ADVIL,MOTRIN) 600 MG tablet Take 1 tablet (600 mg total) by mouth every 8 (eight) hours as needed for moderate pain. 05/21/15   Julianne Rice, MD  ipratropium (ATROVENT) 0.06 % nasal spray Place 2 sprays into both nostrils 4 (four) times daily. 01/27/13   Presson, Annett Gula H, PA  ipratropium-albuterol (DUONEB) 0.5-2.5 (3) MG/3ML SOLN 3 mLs as needed. 04/13/15 04/12/16  [provider]  meclizine (ANTIVERT) 32 MG tablet Take 1 tablet (32 mg total) by mouth 3 (three) times daily as needed. 04/15/16   Seabron Spates, CNM  meloxicam (MOBIC) 15 MG  tablet Take 1 tablet (15 mg total) by mouth daily. 05/18/16   [name], PA-C  methylergonovine (METHERGINE) 0.2 MG tablet Take 1 tablet (0.2 mg total) by mouth every 8 (eight) hours. 04/15/16   Seabron Spates, CNM  metoCLOPramide (REGLAN) 10 MG tablet Take 1 tablet (10 mg total) by mouth every 6 (six) hours as needed for nausea (headache). 05/21/15   Julianne Rice, MD  naproxen (NAPROSYN) 500 MG tablet Take 1 tablet (500 mg total) by mouth 2 (two) times daily. 03/22/15   Palumbo, April, MD  oxyCODONE-acetaminophen (PERCOCET) 10-325 MG tablet Take 1 tablet by mouth every 6 (six) hours as needed for pain. 06/25/15   Melvenia Beam, MD  polyethylene glycol [facility]) packet Take 17 g by mouth daily. 04/15/16   Seabron Spates, CNM  traMADol (ULTRAM) 50 MG tablet Take 1 tablet (50 mg total) by mouth every 12 (twelve) hours as needed. 04/20/17   Renita Papa, PA-C    Family History Family History  Problem Relation Age of Onset  . Heart disease Other   . Thyroid disease Other   . Bone cancer Other   . High blood pressure Other   . Migraines Neg Hx     Social History Social History   Tobacco Use  . Smoking status: Never Smoker  . Smokeless tobacco: Never Used  Substance Use Topics  . Alcohol use: No  . Drug use: No     Allergies   Cephalexin and Moxifloxacin   Review of Systems Review of Systems  Constitutional: Negative for diaphoresis and fever.  HENT: Negative for sore throat.   Eyes: Negative for visual disturbance.  Respiratory: Positive for cough and shortness of breath. Negative for hemoptysis, sputum production and wheezing.   Cardiovascular: Positive for chest pain.  Gastrointestinal: Negative for abdominal pain and vomiting.  Genitourinary: Negative for dysuria.  Musculoskeletal: Negative for neck pain.  Skin: Negative for rash.  Neurological: Negative for headaches.     Physical Exam Updated Vital Signs BP 128/74 (BP Location: Right Arm)   Pulse 95    Temp 98.6 F (37 C) (Oral)   Resp 18   Ht 5\' 7"  (1.702 m)   Wt (!) 149.2 kg   SpO2 100%   BMI 51.53 kg/m   Physical Exam Vitals signs and nursing note reviewed.  Constitutional:      General: She is not in acute distress.    Appearance: She is well-developed.  HENT:     Head: Normocephalic and  atraumatic.  Eyes:     Conjunctiva/sclera: Conjunctivae normal.  Neck:     Musculoskeletal: Neck supple.  Cardiovascular:     Rate and Rhythm: Normal rate and regular rhythm.     Heart sounds: No murmur.  Pulmonary:     Effort: Pulmonary effort is normal. No respiratory distress.     Breath sounds: Normal breath sounds.  Abdominal:     Palpations: Abdomen is soft.     Tenderness: There is no abdominal tenderness.  Musculoskeletal: Normal range of motion.     Right lower leg: She exhibits no tenderness.     Left lower leg: She exhibits no tenderness.  Skin:    General: Skin is warm and dry.     Capillary Refill: Capillary refill takes less than 2 seconds.  Neurological:     General: No focal deficit present.     Mental Status: She is alert.      ED Treatments / Results  Labs (all labs ordered are listed, but only abnormal results are displayed) Labs Reviewed  BASIC METABOLIC PANEL - Abnormal; Notable for the following components:      Result Value   Glucose, Bld 169 (*)    Calcium 8.5 (*)    All other components within normal limits  CBC WITH DIFFERENTIAL/PLATELET - Abnormal; Notable for the following components:   Hemoglobin 11.2 (*)    MCH 25.9 (*)    MCHC 29.6 (*)    All other components within normal limits  SARS CORONAVIRUS 2 (TAT 6-24 HRS)  BRAIN NATRIURETIC PEPTIDE    EKG EKG Interpretation  Date/Time:  Tuesday November 13 2018 07:17:38 EST Ventricular Rate:  88 PR Interval:    QRS Duration: 104 QT Interval:  383 QTC Calculation: 464 R Axis:   -12 Text Interpretation: Sinus rhythm Probable left atrial enlargement Low voltage, precordial leads RSR' in  V1 or V2, right VCD or RVH Left ventricular hypertrophy Borderline T abnormalities, diffuse leads No old tracing to compare Confirmed by Aletta Edouard [phone]) on 11/13/2018 7:23:09 AM   Radiology No results found.  Procedures Procedures (including critical care time)  Medications Ordered in ED Medications  glucagon (human recombinant) (GLUCAGEN) injection 2 mg (2 mg Intravenous Given 11/13/18 0751)  pantoprazole (PROTONIX) injection 40 mg (40 mg Intravenous Given 11/13/18 1154)     Initial Impression / Assessment and Plan / ED Course  I have reviewed the triage vital signs and the nursing notes.  Pertinent labs & imaging results that were available during my care of the patient were reviewed by me and considered in my medical decision making (see chart for details).  Clinical Course as of Nov 13 1854  Tue Nov 12, 4160  5799 48 year old female here with difficulty swallowing foods and foreign Castaneda sensation underneath her sternum. No difficulty swallowing liquids. Chest x-ray interpreted by me no obvious foreign Castaneda noted and poorly inflated lungs. Differential includes esophageal foreign Castaneda, esophagitis, esophageal mass. Also complaining of some shortness of breath so getting some basic labs and a BNP. EKG nonspecific no priors to compare with. Also ox 100%. We will try some IV glucagon to see if we can smooth muscle relax and have her drink to pass the possible foreign Castaneda.   [MB]  [id number] I checked with radiology here on campus and they are unable to do barium swallow because there is no fluoroscopy.   [MB]  0815 After glucagon patient is drinking some soda and she says feels like it may be a  little bit better.  I reviewed the rest of her work-up with her including her normal BNP and renal function.  Placed call into gastroenterology to discuss the case.   [MB]  [id number] Reviewed case with Dr. Alessandra Bevels from GI.  He is recommending patient be transferred ED to ED to [facility] where  he can consult on her.  He is considering endoscopy versus barium swallow.  I talked to Dr. Ronnald Nian from [facility] who accepts the patient in transfer.  Patient is comfortable going by private vehicle to [facility] ED.   [MB]    Clinical Course User Index [MB] Hayden Rasmussen, MD        Final Clinical Impressions(s) / ED Diagnoses   Final diagnoses:  Dysphagia  SOB (shortness of breath)  Esophagitis    ED Discharge Orders    None       Hayden Rasmussen, MD 11/13/18 1857

## 2018-11-13 NOTE — ED Notes (Signed)
Pt arrives via POV from med [facility] for ENDO consult.

## 2018-11-13 NOTE — ED Triage Notes (Signed)
Patient arrived via POV c/o shortness of breath with feeling of object lodged below esophagus x 1 day. Patient endorses Hx of GERD, states she took Pepcid at 1800 yesterday with no relief. Patient is AO x 4, VS WDL, normal gait.

## 2018-11-13 NOTE — Progress Notes (Signed)
-  Barium swallow negative for obstruction.  -Start Protonix 40 mg once a day for next 4 weeks.  -Follow-up with primary GI at [facility] as scheduled.  -Okay to discharge from GI standpoint.  Otis Brace MD, [place] 11/13/2018, 3:40 PM  Contact #  [phone]

## 2018-11-13 NOTE — ED Notes (Signed)
Pt updated on plan of care

## 2018-11-13 NOTE — ED Notes (Signed)
Spoke to [employer], Agricultural consultant at [facility] ED, made aware of patient's transfer and transport via [place].

## 2018-11-13 NOTE — ED Notes (Signed)
Pt transported to Xray. 

## 2019-02-18 ENCOUNTER — Ambulatory Visit (INDEPENDENT_AMBULATORY_CARE_PROVIDER_SITE_OTHER): Payer: Self-pay | Admitting: Bariatrics

## 2019-02-19 ENCOUNTER — Other Ambulatory Visit: Payer: Self-pay

## 2019-02-19 ENCOUNTER — Encounter (INDEPENDENT_AMBULATORY_CARE_PROVIDER_SITE_OTHER): Payer: Self-pay | Admitting: Family Medicine

## 2019-02-19 ENCOUNTER — Ambulatory Visit (INDEPENDENT_AMBULATORY_CARE_PROVIDER_SITE_OTHER): Payer: Medicaid Other | Admitting: Family Medicine

## 2019-02-19 VITALS — BP 147/95 | HR 75 | Temp 98.5°F | Ht 67.0 in | Wt 333.0 lb

## 2019-02-19 DIAGNOSIS — D508 Other iron deficiency anemias: Secondary | ICD-10-CM | POA: Diagnosis not present

## 2019-02-19 DIAGNOSIS — I10 Essential (primary) hypertension: Secondary | ICD-10-CM

## 2019-02-19 DIAGNOSIS — F3289 Other specified depressive episodes: Secondary | ICD-10-CM | POA: Diagnosis not present

## 2019-02-19 DIAGNOSIS — R7303 Prediabetes: Secondary | ICD-10-CM

## 2019-02-19 DIAGNOSIS — R0602 Shortness of breath: Secondary | ICD-10-CM

## 2019-02-19 DIAGNOSIS — R5383 Other fatigue: Secondary | ICD-10-CM

## 2019-02-19 DIAGNOSIS — Z0289 Encounter for other administrative examinations: Secondary | ICD-10-CM

## 2019-02-19 DIAGNOSIS — Z6841 Body Mass Index (BMI) 40.0 and over, adult: Secondary | ICD-10-CM

## 2019-02-19 DIAGNOSIS — M170 Bilateral primary osteoarthritis of knee: Secondary | ICD-10-CM

## 2019-02-19 DIAGNOSIS — G4719 Other hypersomnia: Secondary | ICD-10-CM

## 2019-02-19 NOTE — Progress Notes (Signed)
Dear Dr. Etter Sjogren,   Thank you for referring Jaime Castaneda to our clinic. The following note includes my evaluation and treatment recommendations.  Chief Complaint:   Jaime Castaneda (MR# [id number]) is a 49 y.o. female who presents for evaluation and treatment of obesity and related comorbidities. Current BMI is Body mass index is 52.16 kg/m. Jaime Castaneda has been struggling with her weight for many years and has been unsuccessful in either losing weight, maintaining weight loss, or reaching her healthy weight goal.  Jaime Castaneda is currently in the action stage of change and ready to dedicate time achieving and maintaining a healthier weight. Jaime Castaneda is interested in becoming our patient and working on intensive lifestyle modifications including (but not limited to) diet and exercise for weight loss.  Jaime Castaneda's habits were reviewed today and are as follows: Her family eats meals together, she thinks her family will eat healthier with her, her desired weight loss is 140 pounds, she started gaining weight after childbirth and menopause, her heaviest weight ever was her current weight of 340 pounds, she craves pasta and starch, she skips breakfast and lunch, she is trying to follow a vegetarian or vegan diet, she is frequently drinking liquids with calories, she frequently makes poor food choices, she frequently eats larger portions than normal and she struggles with emotional eating.  Depression Screen Jaime Castaneda's Food and Mood (modified PHQ-9) score was 15.  Depression screen PHQ 2/9 02/19/2019  Decreased Interest 3  Down, Depressed, Hopeless 3  PHQ - 2 Score 6  Altered sleeping 2  Tired, decreased energy 3  Change in appetite 2  Feeling bad or failure about yourself  1  Trouble concentrating 1  Moving slowly or fidgety/restless 0  Suicidal thoughts 0  PHQ-9 Score 15  Difficult doing work/chores Extremely dIfficult   Subjective:   1. Other fatigue Jaime Castaneda admits to daytime  somnolence and reports waking up still tired. Patent has a history of symptoms of daytime fatigue, morning fatigue and morning headache. Jaime Castaneda generally gets 5-7 hours of sleep per night, and states that she has poor sleep quality. Snoring is present. Apneic episodes are not present. Epworth Sleepiness Score is 10.  2. SOB (shortness of breath) on exertion Jaime Castaneda notes increasing shortness of breath with exercising and seems to be worsening over time with weight gain. She notes getting out of breath sooner with activity than she used to. This has gotten worse recently. Jaime Castaneda denies shortness of breath at rest or orthopnea.  3. Essential hypertension Review: taking medications as instructed, no medication side effects noted, no chest pain on exertion, no dyspnea on exertion, no swelling of ankles.  She says her systolic blood pressure is usually less thatn 130.  She reports having white coat hypertension.  BP Readings from Last 3 Encounters:  02/19/19 (!) 147/95  11/13/18 (!) 141/91  04/20/17 120/78   4. Other iron deficiency anemia Jaime Castaneda is not a vegetarian.  She does not have a history of weight loss surgery.  Her anemia is due to menorrhagia and uterine fibroids.  CBC Latest Ref Rng & Units 11/13/2018 04/15/2016 05/21/2015  WBC 4.0 - 10.5 K/uL 6.3 6.0 4.8  Hemoglobin 12.0 - 15.0 g/dL 11.2(L) 12.7 9.4(L)  Hematocrit 36.0 - 46.0 % 37.8 39.3 31.3(L)  Platelets 150 - 400 K/uL 316 276 391   5. Prediabetes Jaime Castaneda has a diagnosis of prediabetes based on her elevated HgA1c and was informed this puts her at greater risk of developing diabetes. She continues  to work on diet and exercise to decrease her risk of diabetes. She denies nausea or hypoglycemia.  6. Osteoarthritis of both knees, unspecified osteoarthritis type She states knee replacement has been discussed with Jaime Castaneda.  7. Excessive daytime sleepiness Jaime Castaneda endorses snoring and morning headaches.  Epworth score is 10  today.  8. Other depression, with emotional eating Jaime Castaneda is struggling with emotional eating and using food for comfort to the extent that it is negatively impacting her health. She has been working on behavior modification techniques to help reduce her emotional eating and has been unsuccessful. She shows no sign of suicidal or homicidal ideations.  PHQ-9 is 15 today.  Assessment/Plan:   1. Other fatigue Jaime Castaneda does feel that her weight is causing her energy to be lower than it should be. Fatigue may be related to obesity, depression or many other causes. Labs will be ordered, and in the meanwhile, Jaime Castaneda will focus on self care including making healthy food choices, increasing physical activity and focusing on stress reduction.  Orders - EKG 12-Lead  2. SOB (shortness of breath) on exertion Jaime Castaneda does feel that she gets out of breath more easily that she used to when she exercises. Makyra's shortness of breath appears to be obesity related and exercise induced. She has agreed to work on weight loss and gradually increase exercise to treat her exercise induced shortness of breath. Will continue to monitor closely.  3. Essential hypertension Jaime Castaneda is working on healthy weight loss and exercise to improve blood pressure control. We will watch for signs of hypotension as she continues her lifestyle modifications.  4. Other iron deficiency anemia Orders and follow up as documented in patient record.  Counseling . Iron is essential for our bodies to make red blood cells.  Reasons that someone may be deficient include: an iron-deficient diet (more likely in those following vegan or vegetarian diets), women with heavy menses, patients with GI disorders or poor absorption, patients that have had bariatric surgery, frequent blood donors, patients with cancer, and patients with heart disease.   Jaime Castaneda foods include dark leafy greens, red and white meats, eggs, seafood, and beans.    . Certain foods and drinks prevent your body from absorbing iron properly. Avoid eating these foods in the same meal as iron-rich foods or with iron supplements. These foods include: coffee, black tea, and red wine; milk, dairy products, and foods that are high in calcium; beans and soybeans; whole grains.  . Constipation can be a side effect of iron supplementation. Increased water and fiber intake are helpful. Water goal: > 2 liters/day. Fiber goal: > 25 grams/day.  5. Prediabetes Jaime Castaneda will continue to work on weight loss, exercise, and decreasing simple carbohydrates to help decrease the risk of diabetes.   6. Osteoarthritis of both knees, unspecified osteoarthritis type Will follow because mobility and pain control are important for weight management.  7. Excessive daytime sleepiness Will monitor.  8. Other depression, with emotional eating Behavior modification techniques were discussed today to help Jaime Castaneda deal with her emotional/non-hunger eating behaviors.  Orders and follow up as documented in patient record.   9. Class 3 severe obesity with serious comorbidity and body mass index (BMI) of 50.0 to 59.9 in adult, unspecified obesity type (HCC) Jaime Castaneda is currently in the action stage of change and her goal is to continue with weight loss efforts. I recommend Jaime Castaneda begin the structured treatment plan as follows:  She has agreed to the Category 3 Plan.  Exercise goals:  No exercise has been prescribed at this time.   Behavioral modification strategies: increasing lean protein intake, decreasing simple carbohydrates, increasing vegetables, increasing water intake, decreasing liquid calories, decreasing sodium intake and increasing high fiber foods.  She was informed of the importance of frequent follow-up visits to maximize her success with intensive lifestyle modifications for her multiple health conditions. She was informed we would discuss her lab results at her next visit  unless there is a critical issue that needs to be addressed sooner. Jaime Castaneda agreed to keep her next visit at the agreed upon time to discuss these results.  Objective:   Blood pressure (!) 147/95, pulse 75, temperature 98.5 F (36.9 C), temperature source Oral, height 5\' 7"  (1.702 m), weight (!) 333 lb (151 kg), last menstrual period 02/15/2019, SpO2 100 %. Body mass index is 52.16 kg/m.  EKG: Normal sinus rhythm, rate 74 bpm.  Indirect Calorimeter completed today shows a VO2 of 330 and a REE of 2299.  Her calculated basal metabolic rate is [id number] thus her basal metabolic rate is better than expected.  General: Cooperative, alert, well developed, in no acute distress. HEENT: Conjunctivae and lids unremarkable. Cardiovascular: Regular rhythm.  Lungs: Normal work of breathing. Neurologic: No focal deficits.   Lab Results  Component Value Date   CREATININE 0.59 11/13/2018   BUN 6 11/13/2018   NA 135 11/13/2018   K 3.5 11/13/2018   CL 100 11/13/2018   CO2 25 11/13/2018   Lab Results  Component Value Date   ALT 9 11/08/2010   AST 15 11/08/2010   ALKPHOS 60 11/08/2010   BILITOT 0.2 (L) 11/08/2010   Lab Results  Component Value Date   WBC 6.3 11/13/2018   HGB 11.2 (L) 11/13/2018   HCT 37.8 11/13/2018   MCV 87.5 11/13/2018   PLT 316 11/13/2018   Attestation Statements:   This is the patient's first visit at [facility]. The patient's NEW PATIENT PACKET was reviewed at length. Included in the packet: current and past health history, medications, allergies, ROS, gynecologic history (women only), surgical history, family history, social history, weight history, weight loss surgery history (for those that have had weight loss surgery), nutritional evaluation, mood and food questionnaire, PHQ9, Epworth questionnaire, sleep habits questionnaire, patient life and health improvement goals questionnaire. These will all be scanned into the patient's chart under media.    During the visit, I independently reviewed the patient's EKG, bioimpedance scale results, and indirect calorimeter results. I used this information to tailor a meal plan for the patient that will help her to lose weight and will improve her obesity-related conditions going forward. I performed a medically necessary appropriate examination and/or evaluation. I discussed the assessment and treatment plan with the patient. The patient was provided an opportunity to ask questions and all were answered. The patient agreed with the plan and demonstrated an understanding of the instructions. Labs were ordered at this visit and will be reviewed at the next visit unless more critical results need to be addressed immediately. I requested labs and records from patient's PCP at [facility]. Clinical information was updated and documented in the EMR.   Time spent on visit including pre-visit chart review and post-visit care was 65 minutes.   I, Water quality scientist, CMA, am acting as Location manager for [employer], DO.  I have reviewed the above documentation for accuracy and completeness, and I agree with the above. Briscoe Deutscher, DO

## 2019-03-04 ENCOUNTER — Ambulatory Visit (INDEPENDENT_AMBULATORY_CARE_PROVIDER_SITE_OTHER): Payer: Self-pay | Admitting: Bariatrics

## 2019-03-05 ENCOUNTER — Ambulatory Visit (INDEPENDENT_AMBULATORY_CARE_PROVIDER_SITE_OTHER): Payer: Medicaid Other | Admitting: Family Medicine

## 2019-03-05 ENCOUNTER — Encounter (INDEPENDENT_AMBULATORY_CARE_PROVIDER_SITE_OTHER): Payer: Self-pay | Admitting: Family Medicine

## 2019-03-05 ENCOUNTER — Other Ambulatory Visit: Payer: Self-pay

## 2019-03-05 VITALS — BP 137/88 | HR 71 | Temp 97.9°F | Ht 67.0 in | Wt 335.0 lb

## 2019-03-05 DIAGNOSIS — R7303 Prediabetes: Secondary | ICD-10-CM

## 2019-03-05 DIAGNOSIS — M170 Bilateral primary osteoarthritis of knee: Secondary | ICD-10-CM | POA: Diagnosis not present

## 2019-03-05 DIAGNOSIS — I10 Essential (primary) hypertension: Secondary | ICD-10-CM | POA: Diagnosis not present

## 2019-03-05 DIAGNOSIS — R600 Localized edema: Secondary | ICD-10-CM

## 2019-03-05 DIAGNOSIS — D508 Other iron deficiency anemias: Secondary | ICD-10-CM | POA: Diagnosis not present

## 2019-03-05 DIAGNOSIS — F3289 Other specified depressive episodes: Secondary | ICD-10-CM

## 2019-03-05 DIAGNOSIS — Z6841 Body Mass Index (BMI) 40.0 and over, adult: Secondary | ICD-10-CM

## 2019-03-05 NOTE — Progress Notes (Signed)
Chief Complaint:   OBESITY Jaime Castaneda is here to discuss her progress with her obesity treatment plan along with follow-up of her obesity related diagnoses. Jaime Castaneda is on the Category 3 Plan and states she is following her eating plan approximately 75% of the time. Jaime Castaneda states she is walking for 30 minutes 2 times per week.  Today's visit was #: 2 Starting weight: 333 lbs Starting date: 02/19/2019 Today's weight: 335 lbs Today's date: 03/05/2019 Total lbs lost to date: 0 Total lbs lost since last in-office visit: 0  Interim History: Jaime Castaneda states she is currently premenstrual.  She provided the following food recall: Breakfast:  Misses sometimes. Lunch:        Sandwiches. Dinner:       Chicken, salad.  No snacks.  She says she is hungry at night.  She has increased her water intake and aloe vera juice.    Subjective:   1. Essential hypertension Review: taking medications as instructed, no medication side effects noted, no chest pain on exertion, no dyspnea on exertion, no swelling of ankles.   BP Readings from Last 3 Encounters:  03/05/19 137/88  02/19/19 (!) 147/95  11/13/18 (!) 141/91   2. Prediabetes Jaime Castaneda has a diagnosis of prediabetes based on her elevated HgA1c and was informed this puts her at greater risk of developing diabetes. She continues to work on diet and exercise to decrease her risk of diabetes. She denies nausea or hypoglycemia.  3. Other iron deficiency anemia Jaime Castaneda is not a vegetarian.  She does not have a history of weight loss surgery.   CBC Latest Ref Rng & Units 11/13/2018 04/15/2016 05/21/2015  WBC 4.0 - 10.5 K/uL 6.3 6.0 4.8  Hemoglobin 12.0 - 15.0 g/dL 11.2(L) 12.7 9.4(L)  Hematocrit 36.0 - 46.0 % 37.8 39.3 31.3(L)  Platelets 150 - 400 K/uL 316 276 391   4. Osteoarthritis of both knees, unspecified osteoarthritis type Jaime Castaneda is followed by [facility] for her OA.  5. Bilateral lower extremity edema Jaime Castaneda is taking Lasix 20 mg as  needed for edema.  6. Other depression, with emotional eating Jaime Castaneda is struggling with emotional eating and using food for comfort to the extent that it is negatively impacting her health. She has been working on behavior modification techniques to help reduce her emotional eating and has been unsuccessful. She shows no sign of suicidal or homicidal ideations.  Assessment/Plan:   1. Essential hypertension Jaime Castaneda is working on healthy weight loss and exercise to improve blood pressure control. We will watch for signs of hypotension as she continues her lifestyle modifications.  Orders - Comprehensive metabolic panel - Lipid panel  2. Prediabetes Jaime Castaneda has a diagnosis of prediabetes based on her elevated HgA1c and was informed this puts her at greater risk of developing diabetes. She continues to work on diet and exercise to decrease her risk of diabetes. She denies nausea or hypoglycemia.  Orders - Hemoglobin A1c - Insulin, random  3. Other iron deficiency anemia Orders and follow up as documented in patient record.  Counseling . Iron is essential for our bodies to make red blood cells.  Reasons that someone may be deficient include: an iron-deficient diet (more likely in those following vegan or vegetarian diets), women with heavy menses, patients with GI disorders or poor absorption, patients that have had bariatric surgery, frequent blood donors, patients with cancer, and patients with heart disease.   Marden Noble foods include dark leafy greens, red and white meats, eggs, seafood, and beans.   Marland Kitchen  Certain foods and drinks prevent your body from absorbing iron properly. Avoid eating these foods in the same meal as iron-rich foods or with iron supplements. These foods include: coffee, black tea, and red wine; milk, dairy products, and foods that are high in calcium; beans and soybeans; whole grains.  . Constipation can be a side effect of iron supplementation. Increased water and  fiber intake are helpful. Water goal: > 2 liters/day. Fiber goal: > 25 grams/day.  Orders - CBC with Differential/Platelet - Anemia panel  4. Osteoarthritis of both knees, unspecified osteoarthritis type Will follow because mobility and pain control are important for weight management.  5. Bilateral lower extremity edema Will check labs today, as below.  Consider HCTZ daily.  Orders - Comprehensive metabolic panel - TSH - T4, free - T3  6. Other depression, with emotional eating Behavior modification techniques were discussed today to help Jaime Castaneda deal with her emotional/non-hunger eating behaviors.  Orders and follow up as documented in patient record.   Orders - VITAMIN D 25 Hydroxy (Vit-D Deficiency, Fractures)  7. Class 3 severe obesity with serious comorbidity and body mass index (BMI) of 50.0 to 59.9 in adult, unspecified obesity type (HCC) Jaime Castaneda is currently in the action stage of change. As such, her goal is to continue with weight loss efforts. She has agreed to the Category 3 Plan.   Exercise goals: As is.  Behavioral modification strategies: increasing lean protein intake, increasing vegetables, increasing water intake and decreasing sodium intake.  Okay protein drink as better option than no meal.  Kippy has agreed to follow-up with our clinic in 2 weeks. She was informed of the importance of frequent follow-up visits to maximize her success with intensive lifestyle modifications for her multiple health conditions.   Jaime Castaneda was informed we would discuss her lab results at her next visit unless there is a critical issue that needs to be addressed sooner. Jaime Castaneda agreed to keep her next visit at the agreed upon time to discuss these results.  Objective:   Blood pressure 137/88, pulse 71, temperature 97.9 F (36.6 C), temperature source Oral, height 5\' 7"  (1.702 m), weight (!) 335 lb (152 kg), last menstrual period 02/15/2019, SpO2 100 %. Body mass index is  52.47 kg/m.  General: Cooperative, alert, well developed, in no acute distress. HEENT: Conjunctivae and lids unremarkable. Cardiovascular: Regular rhythm.  Lungs: Normal work of breathing. Neurologic: No focal deficits.   Lab Results  Component Value Date   CREATININE 0.59 11/13/2018   BUN 6 11/13/2018   NA 135 11/13/2018   K 3.5 11/13/2018   CL 100 11/13/2018   CO2 25 11/13/2018   Lab Results  Component Value Date   ALT 9 11/08/2010   AST 15 11/08/2010   ALKPHOS 60 11/08/2010   BILITOT 0.2 (L) 11/08/2010   Lab Results  Component Value Date   WBC 6.3 11/13/2018   HGB 11.2 (L) 11/13/2018   HCT 37.8 11/13/2018   MCV 87.5 11/13/2018   PLT 316 11/13/2018   Attestation Statements:   Reviewed by clinician on day of visit: allergies, medications, problem list, medical history, surgical history, family history, social history, and previous encounter notes.  I, Water quality scientist, CMA, am acting as Location manager for [employer], DO.  I have reviewed the above documentation for accuracy and completeness, and I agree with the above. Briscoe Deutscher, DO

## 2019-03-06 LAB — CBC WITH DIFFERENTIAL/PLATELET
Basophils Absolute: 0 10*3/uL (ref 0.0–0.2)
Basos: 1 %
EOS (ABSOLUTE): 0 10*3/uL (ref 0.0–0.4)
Eos: 0 %
Hemoglobin: 10.7 g/dL — ABNORMAL LOW (ref 11.1–15.9)
Immature Grans (Abs): 0 10*3/uL (ref 0.0–0.1)
Immature Granulocytes: 0 %
Lymphocytes Absolute: 3 10*3/uL (ref 0.7–3.1)
Lymphs: 49 %
MCH: 24.2 pg — ABNORMAL LOW (ref 26.6–33.0)
MCHC: 30.1 g/dL — ABNORMAL LOW (ref 31.5–35.7)
MCV: 80 fL (ref 79–97)
Monocytes Absolute: 0.4 10*3/uL (ref 0.1–0.9)
Monocytes: 7 %
Neutrophils Absolute: 2.5 10*3/uL (ref 1.4–7.0)
Neutrophils: 43 %
Platelets: 403 10*3/uL (ref 150–450)
RBC: 4.43 x10E6/uL (ref 3.77–5.28)
RDW: 14.2 % (ref 11.7–15.4)
WBC: 6 10*3/uL (ref 3.4–10.8)

## 2019-03-06 LAB — COMPREHENSIVE METABOLIC PANEL
ALT: 13 IU/L (ref 0–32)
AST: 15 IU/L (ref 0–40)
Albumin/Globulin Ratio: 1.2 (ref 1.2–2.2)
Albumin: 4.2 g/dL (ref 3.8–4.8)
Alkaline Phosphatase: 71 IU/L (ref 39–117)
BUN/Creatinine Ratio: 7 — ABNORMAL LOW (ref 9–23)
BUN: 5 mg/dL — ABNORMAL LOW (ref 6–24)
Bilirubin Total: 0.3 mg/dL (ref 0.0–1.2)
CO2: 27 mmol/L (ref 20–29)
Calcium: 9 mg/dL (ref 8.7–10.2)
Chloride: 101 mmol/L (ref 96–106)
Creatinine, Ser: 0.68 mg/dL (ref 0.57–1.00)
GFR calc Af Amer: 118 mL/min/{1.73_m2} (ref >59)
GFR calc non Af Amer: 102 mL/min/{1.73_m2} (ref >59)
Globulin, Total: 3.5 g/dL (ref 1.5–4.5)
Glucose: 81 mg/dL (ref 65–99)
Potassium: 4.3 mmol/L (ref 3.5–5.2)
Sodium: 138 mmol/L (ref 134–144)
Total Protein: 7.7 g/dL (ref 6.0–8.5)

## 2019-03-06 LAB — T4, FREE: Free T4: 1.04 ng/dL (ref 0.82–1.77)

## 2019-03-06 LAB — ANEMIA PANEL
Ferritin: 8 ng/mL — ABNORMAL LOW (ref 15–150)
Folate, Hemolysate: 257 ng/mL
Folate, RBC: 724 ng/mL (ref >498)
Hematocrit: 35.5 % (ref 34.0–46.6)
Iron Saturation: 5 % — CL (ref 15–55)
Iron: 26 ug/dL — ABNORMAL LOW (ref 27–159)
Retic Ct Pct: 1 % (ref 0.6–2.6)
Total Iron Binding Capacity: 481 ug/dL — ABNORMAL HIGH (ref 250–450)
UIBC: 455 ug/dL — ABNORMAL HIGH (ref 131–425)
Vitamin B-12: 343 pg/mL (ref 232–1245)

## 2019-03-06 LAB — LIPID PANEL
Chol/HDL Ratio: 3.1 ratio (ref 0.0–4.4)
Cholesterol, Total: 150 mg/dL (ref 100–199)
HDL: 48 mg/dL (ref >39)
LDL Chol Calc (NIH): 80 mg/dL (ref 0–99)
Triglycerides: 122 mg/dL (ref 0–149)
VLDL Cholesterol Cal: 22 mg/dL (ref 5–40)

## 2019-03-06 LAB — TSH: TSH: 2.25 u[IU]/mL (ref 0.450–4.500)

## 2019-03-06 LAB — HEMOGLOBIN A1C
Est. average glucose Bld gHb Est-mCnc: 137 mg/dL
Hgb A1c MFr Bld: 6.4 % — ABNORMAL HIGH (ref 4.8–5.6)

## 2019-03-06 LAB — INSULIN, RANDOM: INSULIN: 12 u[IU]/mL (ref 2.6–24.9)

## 2019-03-06 LAB — VITAMIN D 25 HYDROXY (VIT D DEFICIENCY, FRACTURES): Vit D, 25-Hydroxy: 18.1 ng/mL — ABNORMAL LOW (ref 30.0–100.0)

## 2019-03-06 LAB — T3: T3, Total: 135 ng/dL (ref 71–180)

## 2019-03-20 ENCOUNTER — Ambulatory Visit (INDEPENDENT_AMBULATORY_CARE_PROVIDER_SITE_OTHER): Payer: Medicaid Other | Admitting: Family Medicine

## 2019-04-02 ENCOUNTER — Telehealth (INDEPENDENT_AMBULATORY_CARE_PROVIDER_SITE_OTHER): Payer: Self-pay

## 2019-04-02 NOTE — Telephone Encounter (Signed)
Patient called wanting to schedule an appt. Please return the call at [phone] to sch.   Jaime Castaneda, [facility]

## 2019-04-03 ENCOUNTER — Ambulatory Visit (INDEPENDENT_AMBULATORY_CARE_PROVIDER_SITE_OTHER): Payer: Medicaid Other | Admitting: Family Medicine

## 2019-04-30 ENCOUNTER — Encounter (INDEPENDENT_AMBULATORY_CARE_PROVIDER_SITE_OTHER): Payer: Self-pay

## 2019-04-30 ENCOUNTER — Ambulatory Visit (INDEPENDENT_AMBULATORY_CARE_PROVIDER_SITE_OTHER): Payer: Medicaid Other | Admitting: Family Medicine

## 2020-02-13 LAB — BASIC METABOLIC PANEL
Anion Gap: 12 mmol/L (ref 2–17)
BUN: 7 mg/dL (ref 6–20)
CO2: 23 mmol/L (ref 22–29)
Calcium: 8.7 mg/dL (ref 8.6–10.0)
Chloride: 102 mmol/L (ref 98–107)
Creatinine: 0.7 mg/dL (ref 0.5–1.0)
GFR African American: 116 mL/min/{1.73_m2} (ref >=90)
GFR Non-African American: 100 mL/min/{1.73_m2} (ref >=90)
Glucose: 174 mg/dL — ABNORMAL HIGH (ref 70–99)
OSMOLALITY CALCULATED: 274 mOsm/kg (ref 270–287)
Potassium: 3.9 mmol/L (ref 3.5–5.3)
Sodium: 136 mmol/L (ref 135–145)

## 2020-02-13 LAB — CBC WITH AUTO DIFFERENTIAL
Absolute Baso #: 0 10*3/uL (ref 0.0–0.2)
Absolute Eos #: 0.1 10*3/uL (ref 0.0–0.5)
Absolute Lymph #: 2.5 10*3/uL (ref 1.0–3.2)
Absolute Mono #: 0.3 10*3/uL (ref 0.3–1.0)
Basophils %: 0.2 % (ref 0.0–2.0)
Eosinophils %: 1.1 % (ref 0.0–7.0)
Hematocrit: 30 % — ABNORMAL LOW (ref 34.0–47.0)
Hemoglobin: 8.3 g/dL — ABNORMAL LOW (ref 11.5–15.7)
Immature Grans (Abs): 0.01 10*3/uL (ref 0.00–0.06)
Immature Granulocytes: 0.2 % (ref 0.0–0.6)
Lymphocytes: 45.3 % — ABNORMAL HIGH (ref 15.0–45.0)
MCH: 19.6 pg — ABNORMAL LOW (ref 27.0–34.5)
MCHC: 27.7 g/dL — ABNORMAL LOW (ref 32.0–36.0)
MCV: 70.9 fL — ABNORMAL LOW (ref 81.0–99.0)
MPV: 8.9 fL (ref 7.2–13.2)
Monocytes: 5.6 % (ref 4.0–12.0)
Neutrophils %: 47.6 % (ref 42.0–74.0)
Neutrophils Absolute: 2.7 10*3/uL (ref 1.6–7.3)
Platelets: 468 10*3/uL — ABNORMAL HIGH (ref 140–440)
RBC: 4.23 x10e6/mcL (ref 3.60–5.20)
RDW: 17.5 % — ABNORMAL HIGH (ref 11.0–16.0)
WBC: 5.6 10*3/uL (ref 3.8–10.6)

## 2020-02-13 LAB — HCG, SERUM, QUALITATIVE: Preg, Serum: NEGATIVE

## 2020-02-13 LAB — COVID-19 & INFLUENZA COMBO
INFLUENZA A: NOT DETECTED
INFLUENZA B: NOT DETECTED
SARS-CoV-2: NOT DETECTED

## 2020-02-13 LAB — TROPONIN T
Troponin T: <0.01 ng/mL (ref 0.000–0.010)
Troponin T: <0.01 ng/mL (ref 0.000–0.010)

## 2020-02-13 LAB — MORPHOLOGY CHECK
Platelet Estimate: INCREASED — AB
RBC Morphology: ABNORMAL — AB

## 2020-02-13 LAB — PTT: PTT: 22.3 seconds — ABNORMAL LOW (ref 23.3–34.5)

## 2020-02-13 LAB — D-DIMER, QUANTITATIVE: D-Dimer, Quant: 1.33 mcg/mL FEU — ABNORMAL HIGH (ref 0.19–0.51)

## 2020-02-13 LAB — N TERMINAL PROBNP (AKA NTPROBNP): NT Pro-BNP: 61 pg/mL (ref 0–125)

## 2020-02-13 NOTE — Nursing Note (Signed)
Adult Patient History Form-Text       Adult Patient History Entered On:  02/13/2020 18:30 EST    Performed On:  02/13/2020 18:12 EST by Orvis Brill H-RN               General Info   Patient Identified :   Verbal   Patient Identified :   Ann Johnson   Information Given By :   Self   In Charge of News (ICON) Name :   Harland German (   Pregnancy Status :   Patient denies   Has the patient received chemotherapy or immunotherapy (cytotoxic)  in the last 48-72 hours? :   No   In Clinical Trial With Signed Consent for Related Condition :   N/A   Is the patient currently (2-3 days) receiving radiation treatment? :   No   Orvis Brill H-RN - 02/13/2020 18:12 EST   Allergies   (As Of: 02/13/2020 18:30:01 EST)   Allergies (Active)   No Known Medication Allergies  Estimated Onset Date:   Unspecified ; Created By:   Lorel Monaco; Reaction Status:   Active ; Category:   Drug ; Substance:   No Known Medication Allergies ; Type:   Allergy ; Updated By:   Lorel Monaco; Reviewed Date:   02/13/2020 18:15 EST        Medication History   Medication List   (As Of: 02/13/2020 18:30:01 EST)   Normal Order    heparin 25,000 units/250 mL-D5W 25,000 units [2000 unit/hr] + Premix D5W 250 mL  :   heparin 25,000 units/250 mL-D5W 25,000 units [2000 unit/hr] + Premix D5W 250 mL ; Status:   Ordered ; Ordered As Mnemonic:   heparin additive 25,000 units [2000 unit/hr] + Premix D5W 250 mL ; Simple Display Line:   20 mL/hr, IV ; Ordering Provider:   Ian Bushman,  DAVID C-MD; Catalog Code:   Premix D5W ; Order Dt/Tm:   02/13/2020 15:35:56 EST ; Comment:   Table for Heparin Adjustment based on PTT:    PTT Bolus Dose Adjustments to Infusion  < 53            4,000 units                 Increase dose by 4 units/kg/hr  53-67  3,000 units Increase dose by 2 units/kg/hr  68-106  No bolus  Target PTT - no change  107-123  No bolus  Decrease dose by 1 unit/kg/hr  124-137  No bolus  Hold x 30 minutes, decrease dose by 2 units/kg/hr  > 137  No bolus  Hold x 1 hour,  decrease dose by3 units/kg/hr    PTT-heparin 4hrs after infusion start, dose adjustment for the first 24hrs then    PTT-heparin 6 hrs after infusion start, dose adjustment or single PTT in target range (QAM after 2 consecutive= 68-106)  High Alert Medication          heparin 1000 units/mL Inj Soln 10 mL  :   heparin 1000 units/mL Inj Soln 10 mL ; Status:   Completed ; Ordered As Mnemonic:   heparin (max. cap 10,000 units) ; Simple Display Line:   10,000 units, 10 mL, IV Push, Once ; Ordering Provider:   HURT,  DAVID C-MD; Catalog Code:   heparin ; Order Dt/Tm:   02/13/2020 15:35:56 EST ; Comment:   Target Dose: heparin (max. cap 10,000 units) 80 unit/kg  02/13/2020 15:35:59*Standardized*  iopamidol 76% Inj Soln 100 mL  :   iopamidol 76% Inj Soln 100 mL ; Status:   Completed ; Ordered As Mnemonic:   [id number] ; Simple Display Line:   100 mL, IV Contrast, Once ; Ordering Provider:   HURT,  DAVID C-MD; Catalog Code:   iopamidol ; Order Dt/Tm:   02/13/2020 14:52:54 EST            Home Meds    albuterol  :   albuterol ; Status:   Documented ; Ordered As Mnemonic:   albuterol ; Simple Display Line:   2.5 mg, Inhale, q2hr, PRN: shortness of breath/wheezing, 0 Refill(s) ; Catalog Code:   albuterol ; Order Dt/Tm:   02/13/2020 18:18:30 EST          norethindrone  :   norethindrone ; Status:   Documented ; Ordered As Mnemonic:   norethindrone 5 mg oral tablet ; Simple Display Line:   10 mg, 2 tabs, Oral, Daily, 180 tabs, 0 Refill(s) ; Catalog Code:   norethindrone ; Order Dt/Tm:   02/13/2020 18:19:17 EST            Problem History   (As Of: 02/13/2020 18:30:01 EST)   Problems(Active)    No Chronic Problems (Cerner  :NKP )  Name of Problem:   No Chronic Problems ; Recorder:   Lorel Monaco; Code:   NKP ; Last Updated:   02/13/2020 13:05 EST ; Life Cycle Date:   02/13/2020 ; Life Cycle Status:   Active ; Vocabulary:   Cerner          Diagnoses(Active)    Anemia  Date:   02/13/2020 ; Diagnosis Type:   Discharge ;  Confirmation:   Confirmed ; Clinical Dx:   Anemia ; Classification:   Medical ; Clinical Service:   Non-Specified ; Code:   ICD-10-CM ; Probability:   0 ; Diagnosis Code:   D64.9      Pulmonary embolism  Date:   02/13/2020 ; Diagnosis Type:   Discharge ; Confirmation:   Confirmed ; Clinical Dx:   Pulmonary embolism ; Classification:   Medical ; Clinical Service:   Non-Specified ; Code:   ICD-10-CM ; Probability:   0 ; Diagnosis Code:   I26.99      Shortness of breath  Date:   02/13/2020 ; Diagnosis Type:   Reason For Visit ; Confirmation:   Complaint of ; Clinical Dx:   Shortness of breath ; Classification:   Medical ; Clinical Service:   Emergency medicine ; Code:   PNED ; Probability:   0 ; Diagnosis Code:   [id number]        Procedure History        -    Procedure History   (As Of: 02/13/2020 18:30:01 EST)     Immunizations   Influenza Vaccine Status :   Not received   Orvis Brill H-RN - 02/13/2020 18:12 EST   ID Risk Screen Symptoms   Recent Travel History :   No recent travel   Close Contact with COVID-19 ID :   No   TB Symptom Screen :   No symptoms   C. diff Symptom/History ID :   Unable to obtain   MRSA/VRE Screening :   None of these apply   CRE Screening :   No   Orvis Brill H-RN - 02/13/2020 18:12 EST   Bloodless Medicine   Is Blood Transfusion Acceptable to Patient :   Yes  Orvis Brill H-RN - 02/13/2020 18:12 EST   Nutrition   MST Does Your Current Diet Include :   None   Nutrition Screen for Malnutrition :   Patient denies   Orvis Brill H-RN - 02/13/2020 18:12 EST   Functional   ADLs Prior to Admission :   Independent   Orvis Brill H-RN - 02/13/2020 18:12 EST   Social History   Social History   (As Of: 02/13/2020 18:30:01 EST)   Tobacco:        Tobacco use: Never (less than 100 in lifetime).   (Last Updated: 02/13/2020 18:21:33 EST by Beverly Sessions)          Electronic Cigarette/Vaping:        Never Electronic Cigarette Use.   (Last Updated: 02/13/2020 18:21:53 EST  by Beverly Sessions)          Alcohol:        Denies   (Last Updated: 02/13/2020 18:22:05 EST by Beverly Sessions)          Substance Use:        Denies   (Last Updated: 02/13/2020 18:22:14 EST by Beverly Sessions)            Spiritual   Faith/Denomination :   Ephriam Knuckles   Do you have a concern that you would like to address with a Chaplain? :   No   Do you have any religious/spiritual/cultural beliefs that could impact the way your care is provided? :   No   Orvis Brill H-RN - 02/13/2020 18:12 EST   Harm Screen   Suspect or Concern for: :   None   Feels Safe Where Live :   Yes   Agency(s)/Others notified :   No   Last 3 mo, thoughts killing self/others :   Patient denies   Cognitively Impaired :   No   Ambulatory or Self Mobile in [facility] :   Yes   Orvis Brill H-RN - 02/13/2020 18:12 EST   Advance Directive   Advance Directive :   No   Patient Wishes to Receive Further Information on Advance Directives :   No   Beverly Sessions - 02/13/2020 18:12 EST   Education   Written Language :   Lenox Ponds   Caregiver/Advocate Primary Language :   Lenox Ponds   Primary Language :   Horton Finer - 02/13/2020 18:12 EST   Caregiver/Advocate Language   Patient :   Programme researcher, broadcasting/film/video, Verbal explanation   Orvis Brill H-RN - 02/13/2020 18:12 EST   Barriers to Learning :   None evident   Responsible Learner Present for Session :   Yes   Additional Session Learner(s) Present :   Daughter   Orvis Brill H-RN - 02/13/2020 18:12 EST   Preventative Measures Information   Unit/Room Orientation :   Trenton Gammon understanding   Environmental Safety :   Verbalizes understanding   Hand Washing :   Verbalizes understanding   Infection Prevention :   Verbalizes understanding   DVT Prophylaxis :   Verbalizes understanding   Isolation Precaution :   Verbalizes understanding   Orvis Brill H-RN - 02/13/2020 18:12 EST   DC Needs   CM Living Situation :   Home with no services   Anticipated Discharge Needs :   None   Orvis Brill  H-RN - 02/13/2020 18:12 EST  Valuables and Belongings   Valuables and Belongings   At Bedside :   Clothes, Purse/Wallet, Cell phone   Orvis Brill H-RN - 02/13/2020 18:12 EST   Patient Search Completed :   [facility] - 02/13/2020 18:12 EST   Admission Complete   Admission Complete :   Yes   Orvis Brill H-RN - 02/13/2020 18:12 EST

## 2020-02-13 NOTE — ED Notes (Signed)
ED Patient Education Note     Patient Education Materials Follows:                        Excuse from Work, School, or Physical Activity      ____[name]____ needs to be excused from:  __x__ Work  _____ [employer]  _____ Physical activity        _x___ He or she may return to full physical activity as of ______2/13/22_____.        Health Care Provider Name (printed): ____[facility]___________   Health Care Provider (signature): ___________________________________________  Date: ____2/10/22____  This information is not intended to replace advice given to you by your health care provider. Make sure you discuss any questions you have with your health care provider.

## 2020-02-13 NOTE — ED Notes (Signed)
ED Triage Note       ED Secondary Triage Entered On:  02/13/2020 13:06 EST    Performed On:  02/13/2020 13:06 EST by Lorel Monaco               General Information   Barriers to Learning :   None evident   Influenza Vaccine Status :   Not received   COVID-19 Vaccine Status :   2 Doses received   ED Home Meds Section :   Document assessment   [employer] ED Fall Risk Section :   Document assessment   ED Advance Directives Section :   Document assessment   ED Palliative Screen :   N/A (prefilled for <65yo)   Lorel Monaco - 02/13/2020 13:06 EST   (As Of: 02/13/2020 13:06:51 EST)   Problems(Active)    No Chronic Problems (Cerner  :NKP )  Name of Problem:   No Chronic Problems ; Recorder:   Lorel Monaco; Code:   NKP ; Last Updated:   02/13/2020 13:05 EST ; Life Cycle Date:   02/13/2020 ; Life Cycle Status:   Active ; Vocabulary:   Cerner          Diagnoses(Active)    Shortness of breath  Date:   02/13/2020 ; Diagnosis Type:   Reason For Visit ; Confirmation:   Complaint of ; Clinical Dx:   Shortness of breath ; Classification:   Medical ; Clinical Service:   Emergency medicine ; Code:   PNED ; Probability:   0 ; Diagnosis Code:   [id number]             -    Procedure History   (As Of: 02/13/2020 13:06:51 EST)     Phoebe Perch Fall Risk Assessment Tool   Hx of falling last 3 months ED Fall :   No   Patient confused or disoriented ED Fall :   No   Patient intoxicated or sedated ED Fall :   No   Patient impaired gait ED Fall :   No   Use a mobility assistance device ED Fall :   No   Patient altered elimination ED Fall :   No   [facility] ED Fall Score :   0    Lorel Monaco - 02/13/2020 13:06 EST   ED Advance Directive   Advance Directive :   No   Lorel Monaco - 02/13/2020 13:06 EST   Med Hx   Medication List   (As Of: 02/13/2020 13:06:51 EST)   No Known Home Medications     Lorel Monaco - 02/13/2020 13:06:49

## 2020-02-13 NOTE — ED Notes (Signed)
 ED Patient Summary              [facility]  [address] [place], [place] [address]  [id number]  Discharge Instructions (Patient)  Name: Ann Johnson, Ann Johnson  DOB: June 15, 1970                   MRN: [id number]                   FIN: [id number]  Reason For Visit: Shortness of breath; CHEST PAIN AND SOB  Final Diagnosis: Anemia; Pulmonary embolism     Visit Date: 02/13/2020 13:00:00  Address: [address] [place] [address]  Phone: [phone]     Emergency Department Providers:        Primary Physician:      JEWEL ALM JAYSON [facility] would like to thank you for allowing us  to assist you with your healthcare needs. The following includes patient education materials and information regarding your injury/illness.     Follow-up Instructions:  You were seen today on an emergency basis. Please contact your primary care doctor for a follow up appointment. If you received a referral to a specialist doctor, it is important you follow-up as instructed.    It is important that you call your follow-up doctor to schedule and confirm the location of your next appointment. Your doctor may practice at multiple locations. The office location of your follow-up appointment may be different to the one written on your discharge instructions.    If you do not have a primary care doctor, please call [phone] for help in finding a [facility] Provider. For help in finding a specialist doctor, please call [phone].    If your condition gets worse before your follow-up with your primary care doctor or specialist, please return to the Emergency Department.      Coronavirus 2019 (COVID-19) Reminders:     Patients age 16 - 66, with parental consent, and patients over age 38 can make an appointment for a COVID-19 vaccine. Patients can contact their [facility] doctors' offices to schedule an appointment to receive the COVID-19 vaccine.  Patients who do not have a [facility] physician can call [phone] to schedule vaccination appointments.      Follow Up Appointments:  Primary Care Provider:     Name: PCP,  NONE     Phone:          Allergy Info: No Known Medication Allergies     Discharge Additional Information    Patient Education Materials:                         Excuse from Work, School, or Physical Activity      ____[name]____ needs to be excused from:  __x__ Work  _____ [employer]  _____ Physical activity        _x___ He or she may return to full physical activity as of ______2/13/22_____.        Health Care Provider Name (printed): ____[facility]___________   Health Care Provider (signature): ___________________________________________  Date: ____2/10/22____  This information is not intended to replace advice given to you by your health care provider. Make sure you discuss any questions you have with your health care provider.                ---------------------------------------------------------------------------------------------------------------------  [facility]) encourages you to self-enroll in the [facility] Patient Portal.  [facility] Patient Portal will allow you to manage your personal health information securely from your own electronic device now and in the future.  To begin your Patient Portal enrollment process, please visit [email or web address] Click on "Sign up now" under [facility].  If you find that you need additional assistance on the [facility] Patient Portal or need a copy of your medical records, please call the [facility] Medical Records Office at [phone].  Comment:

## 2020-02-13 NOTE — ED Provider Notes (Signed)
Shortness of breath        Patient:   Ann Johnson, Ann Johnson             MRN: [id number]            FIN: [id number]               Age:   50 years     Sex:  Female     DOB:  10-Nov-1970   Associated Diagnoses:   Anemia; Pulmonary embolism   Author:   Lerry Liner,  Sanvika Cuttino C-MD      Basic Information   Time seen: Provider Seen (ST)   ED Provider/Time:    Genessis Flanary,  Ghassan Coggeshall C-MD / 02/13/2020 13:09  .   Additional information: Chief Complaint from Nursing Triage Note   Chief Complaint  Chief Complaint: CP AND SOB SINCE LAST NIGHT. NONPRODUCTIVE COUGH X 2 DAYS. (02/13/20 13:03:00).      History of Present Illness   50 year old female presents emergency department for complaints of feeling short of breath. Patient reports that she has been feeling poorly the last several days. She says that yesterday she had some left-sided squeezing discomfort in her chest, did not otherwise radiate. Associated with that she just felt more fatigued, somewhat short of breath. She has known history of anemia, she also reports that she has been traveling recently for work, driving between [place] and [place]. She denies any new swelling or edema. She denies any personal history of heart disease or diabetes, denies blood pressure smoking. She has no personal history of clotting disorder. She does take birth control. She currently denies significant discomfort just was feeling somewhat short of breath today and therefore came in for evaluation. She has had mild cough and congestion. She is immunized against COVID. She denies abdominal pain, nausea, vomiting, diaphoresis. No clear alleviating therapies prior to arrival..        Review of Systems             Additional review of systems information: All other systems reviewed and otherwise negative.      Health Status   Allergies:    Allergic Reactions (Selected)  No Known Medication Allergies.      Past Medical/ Family/ Social History   Medical history: Reviewed as documented in chart.   Surgical history:  Reviewed as documented in chart.   Family history: Not significant.   Social history: Reviewed as documented in chart.   Problem list:    Active Problems (1)  No Chronic Problems   .      Physical Examination               Vital Signs   Vital Signs   0/34/7425 95:63 EST Systolic Blood Pressure 875 mmHg    Diastolic Blood Pressure 73 mmHg    Heart Rate Monitored 68 bpm    Respiratory Rate 21 br/min  HI    Mean Arterial Pressure, Cuff 94 mmHg    SpO2 100 %   6/43/3295 18:84 EST Systolic Blood Pressure 166 mmHg  HI    Diastolic Blood Pressure 86 mmHg    Heart Rate Monitored 92 bpm    Respiratory Rate 19 br/min    Mean Arterial Pressure, Cuff 106 mmHg  HI    SpO2 96 %   02/13/2020 13:03 EST Temperature Oral 37.4 degC  HI    Heart Rate Monitored 95 bpm    Respiratory Rate 18 br/min    SpO2 98 %   .  Measurements   02/13/2020 13:06 EST Body Mass Index est meas 51.90 kg/m2    Body Mass Index Measured 51.90 kg/m2   02/13/2020 13:03 EST Height/Length Measured 170 cm    Weight Dosing 150 kg   .   General: Awake alert in no acute distress  Eyes: Pupils equal round reactive to light, sclera anicteric  ENT: Nares patent, moist mucous membranes,   Neck: Supple, no meningismus  Respiratory: Equal air entry bilaterally, normal work of breathing, no wheezing.  Cardiac: Regular rate, regular rhythm, normal S1, S2, 2+ equal radial pulses b/l.   Abdomen: Soft, obese, nontender, nondistended, normal bowel sounds,   Extremities: No obvious deformity, warm well perfused, no pitting peripheral edema  Neuro: Awake alert oriented, moving all 4, intact sensation to light touch,  Skin: Warm, well perfused  Psych: Calm and cooperative      Medical Decision Making   Rationale:  Patient is here for complaints of shortness of breath and some left-sided squeezing chest discomfort. On arrival here she is awake alert vital signs initially reassuring. EKG x2 is without clear ischemic arrhythmic transfer interpretation. She is on birth control and has  been traveling more for business and therefore screening D-dimer was sent, dimer was elevated underwent CT angiography which had findings of bilateral pulmonary embolism with signs of right heart strain. She is anemic with a hemoglobin of 8.2 it sounds like it is been somewhat chronic for her. Have a discussion about anticoagulation we will start her on heparin drip. Given RV to LV ratio of 1.1 we will plan for admission to ICU for observation..   Documents reviewed:  Emergency department nurses' notes.   Electrocardiogram:  Emergency Provider interpretation performed by me, EKGs reviewed interpreted myself, initial EKG was reviewed at 1308, demonstrates sinus rhythm, rate of 95, there is left axis, there was some baseline artifact which affects interpretation but I did not see acute ST segment elevation MI.    A repeat EKG was obtained at 1336. Again was reviewed interpreted myself, demonstrate sinus rhythm, rate of 81, there is left axis deviation, I do not see acute st segment elevation MI. There is overall no significant change compared to previous tracing.Marland Kitchen    Results review:  Lab results : Lab View   02/13/2020 14:04 EST Estimated Creatinine Clearance 145.39 mL/min   02/13/2020 13:44 EST WBC 5.6 x10e3/mcL    RBC 4.23 x10e6/mcL    Hgb 8.3 g/dL  LOW    HCT 30.0 %  LOW    MCV 70.9 fL  LOW    MCH 19.6 pg  LOW    MCHC 27.7 g/dL  LOW    RDW 17.5 %  HI    Platelet 468 x10e3/mcL  HI    MPV 8.9 fL    Neutro Auto 47.6 %    Neutro Absolute 2.7 x10e3/mcL    Immature Grans Percent 0.2 %    Immature Grans Absolute 0.01 x10e3/mcL    Lymph Auto 45.3 %  HI    Lymph Absolute 2.5 x10e3/mcL    Mono Auto 5.6 %    Mono Absolute 0.3 x10e3/mcL    Eosinophil Percent 1.1 %    Eos Absolute 0.1 x10e3/mcL    Basophil Auto 0.2 %    Baso Absolute 0.0 x10e3/mcL    Anisocytosis Moderate    Hypochromia Moderate    PLT estimate Increased    Polychromasia Few    RBC morphology Abnormal    Target cells Few    D  Dimer 1.33 mcg/mL FEU  HI    Sodium  Lvl 136 mmol/L    Potassium Lvl 3.9 mmol/L    Chloride 102 mmol/L    CO2 23 mmol/L    Glucose Random 174 mg/dL  HI    BUN 7 mg/dL    Creatinine Lvl 0.7 mg/dL    AGAP 12 mmol/L    Osmolality Calc 274 mOsm/kg    Calcium Lvl 8.7 mg/dL    eGFR AA 116 mL/min/1.41m???    eGFR Non-AA 100 mL/min/1.730m??    NTproBNP 61 pg/mL    Trop T Quant <0.010 ng/mL   02/13/2020 13:15 EST COVID (SARS-CoV-2) (LIAT) Not Detected    Influenza A (LIAT) Not Detected    Influenza B (LIAT) Not Detected   .   Radiology results:  Rad Results (ST)   CT Angiography Chest w/ + w/o Contrast  ?  02/13/20 15:25:31  CTA chest: 02/13/20    INDICATION:PE suspected, high prob.    COMPARISON: Chest x-ray 02/13/2020    TECHNIQUE: PE protocol (Imaging from the thoracic inlet through the  hemidiaphragms following contrast in the pulmonary arterial phase. Axial soft  tissue and lung 5 x 5 mm images. Additional multiplanar 3-D volumetric MIP  reconstructions through the pulmonary arteries were generated and reviewed per  protocol.) CT scanning was performed using radiation dose reduction techniques  when appropriate, per system protocols.    FINDINGS: The lungs are clear without focal infiltrate. No pleural, no  pericardial effusion.  Centrally located pulmonary emboli noted within the lower lobe branches  bilaterally. Additional small nonocclusive thrombus noted within the left upper  lobe second order branch. Thoracic aorta normal in caliber, intact. Cannot  exclude mild right heart strain. RV/LV ratio approximately 1.1. Moderate  thoracic spondylosis, no acute bony abnormality. Visualized portions adrenal  glands unremarkable.    IMPRESSION:    1. Acute appearing bilateral pulmonary emboli somewhat centrally located within  the lower lobe branches bilaterally. Suspect mild right heart strain. RV/LV  ratio approximately 1.1.  2. Clear lungs.    The peerVue module has been activated for the above report.  ?  Signed By: CAAnice PaganiniP-MD  ?  **************************************************  XR Chest 1 View Portable  ?  02/13/20 13:29:55  Chest AP: 02/13/20    INDICATION:Shortness of breath (SOB).    COMPARISON: None    FINDINGS: Markedly shallow lung volumes. Central bronchovascular crowding. No  pneumothorax or pleural fluid. No edema or focal opacity. Cardiomediastinal  contours are normal for AP technique. No evident bony abnormality.    IMPRESSION:  Shallow inspiratory effort-no acute airspace opacity.  ?  Signed By: CAAnice Paganini-MD  .      Reexamination/ Reevaluation   Vital signs   results included from flowsheet : Vital Signs   02/04/07/6045440:98ST Systolic Blood Pressure 13119mHg    Diastolic Blood Pressure 75 mmHg    Heart Rate Monitored 81 bpm    Respiratory Rate 15 br/min    Mean Arterial Pressure, Cuff 97 mmHg    SpO2 99 %      Course: improving.      Procedure   Critical care note   Total time: 30 minutes spent engaged in work directly related to patient care and/ or available for direct patient care.   Critical condition(s) addressed for impending deterioration include: cardiovascular.   Management: bedside assessment, supervision of care, Interpretation (chest x-ray, electrocardiogram, blood pressure), Case review medical specialist.   Performed by: self.  Impression and Plan   Diagnosis   Anemia (ICD10-CM D64.9, Discharge, Medical)   Pulmonary embolism (ICD10-CM I26.99, Discharge, Medical)      Calls-Consults   -  SCHURING,  CRAIG ANTHONY-DO, recommends Given the low PESI score of 51, feels she would be appropriate for stepdown/floor admission, has recommended hospitalist admission..    Plan   Condition: Guarded.    Disposition: Admit time  02/13/2020 15:57:00, Place in Observation Telemetry Unit, [place],  REBECCA T-MD.    Counseled: Patient, Regarding diagnosis, Regarding diagnostic results, Regarding treatment plan.    Signature Line     Electronically Signed on 02/13/2020 04:00 PM EST    ________________________________________________   Jovita Kussmaul C-MD               Modified by: Lerry Liner,  Tierrah Anastos C-MD on 02/13/2020 03:42 PM EST      Modified by: Lerry Liner,  Aviah Sorci C-MD on 02/13/2020 04:00 PM EST

## 2020-02-13 NOTE — Discharge Summary (Signed)
 ED Clinical Summary                     [facility]  [address]  [place], [place], [address]  [phone]          PERSON INFORMATION  Name: Ann Johnson, Ann Johnson Age:  50 Years DOB: 03-20-1970   Sex: Female Language: English PCP: PCP,  NONE   Marital Status: Single Phone: [phone] Med Service: MED-Medicine   MRN: [id number] Acct# [id number] Arrival: 02/13/2020 13:00:00   Visit Reason: Shortness of breath; CHEST PAIN AND SOB Acuity: 3 LOS: 000 04:16   Address:    [address] [place] [address]   Diagnosis:    Anemia; Pulmonary embolism  Medications:    Medications Administered During Visit:                Medication Dose Route   iopamidol 100 mL IV Contrast   heparin 250 mL Initial Volume  20 mL/hr IV  Forearm, Upper Left   Premix D5W 250 mL Initial Volume  20 mL/hr IV  Forearm, Upper Left   heparin 10000 units IV Push               Allergies      No Known Medication Allergies      Major Tests and Procedures:  The following procedures and tests were performed during your ED visit.  COMMON PROCEDURES%>  COMMON PROCEDURES COMMENTS%>                PROVIDER INFORMATION               Provider Role Assigned Sampson Orlando Blackbird ED Nurse 02/13/2020 13:03:27    HURT, DAVID C-MD ED Provider 02/13/2020 13:09:26        Attending Physician:  VIVIENNE STABS T-MD      Admit Doc  VIVIENNE STABS T-MD     Consulting Doc  VIVIENNE STABS T-MD     VITALS INFORMATION  Vital Sign Triage Latest   Temp Oral ORAL_1%> ORAL%>   Temp Temporal TEMPORAL_1%> TEMPORAL%>   Temp Intravascular INTRAVASCULAR_1%> INTRAVASCULAR%>   Temp Axillary AXILLARY_1%> AXILLARY%>   Temp Rectal RECTAL_1%> RECTAL%>   02 Sat 98 % 100 %   Respiratory Rate RATE_1%> RATE%>   Peripheral Pulse Rate PULSE RATE_1%> PULSE RATE%>   Apical Heart Rate HEART RATE_1%> HEART RATE%>   Blood Pressure BLOOD PRESSURE_1%>/ BLOOD PRESSURE_1%>86 mmHg BLOOD PRESSURE%> / BLOOD PRESSURE%>96 mmHg                  Immunizations      No Immunizations Documented This Visit          DISCHARGE INFORMATION   Discharge Disposition: S Outpt-Admitted As Inpatient   Discharge Location:  [facility]   Discharge Date and Time:     ED Checkout Date and Time:  02/13/2020 17:16:19     DEPART REASON INCOMPLETE INFORMATION             Problems      Active           No Chronic Problems              Smoking Status      No Smoking Status Documented         PATIENT EDUCATION INFORMATION  Instructions:     Excuse from Work, [employer], or Physical Activity with logo Maya) (PHILST)     Follow  up:            ED PROVIDER DOCUMENTATION     Patient:   Ann Johnson, Ann Johnson             MRN: [id number]            FIN: [id number]               Age:   78 years     Sex:  Female     DOB:  [birth year]   Associated Diagnoses:   Anemia; Pulmonary embolism   Author:   JEWEL,  DAVID C-MD      Basic Information   Time seen: Provider Seen (ST)   ED Provider/Time:    HURT,  DAVID C-MD / 02/13/2020 13:09  .   Additional information: Chief Complaint from Nursing Triage Note   Chief Complaint  Chief Complaint: CP AND SOB SINCE LAST NIGHT. NONPRODUCTIVE COUGH X 2 DAYS. (02/13/20 13:03:00).      History of Present Illness   50 year old female presents emergency department for complaints of feeling short of breath. Patient reports that she has been feeling poorly the last several days. She says that yesterday she had some left-sided squeezing discomfort in her chest, did not otherwise radiate. Associated with that she just felt more fatigued, somewhat short of breath. She has known history of anemia, she also reports that she has been traveling recently for work, driving between [place]  and [place] . She denies any new swelling or edema. She denies any personal history of heart disease or diabetes, denies blood pressure smoking. She has no personal history of clotting disorder. She does take birth control. She currently denies significant discomfort just was feeling somewhat  short of breath today and therefore came in for evaluation. She has had mild cough and congestion. She is immunized against COVID. She denies abdominal pain, nausea, vomiting, diaphoresis. No clear alleviating therapies prior to arrival..        Review of Systems             Additional review of systems information: All other systems reviewed and otherwise negative.      Health Status   Allergies:    Allergic Reactions (Selected)  No Known Medication Allergies.      Past Medical/ Family/ Social History   Medical history: Reviewed as documented in chart.   Surgical history: Reviewed as documented in chart.   Family history: Not significant.   Social history: Reviewed as documented in chart.   Problem list:    Active Problems (1)  No Chronic Problems   .      Physical Examination               Vital Signs   Vital Signs   02/13/2020 14:06 EST Systolic Blood Pressure 139 mmHg    Diastolic Blood Pressure 73 mmHg    Heart Rate Monitored 68 bpm    Respiratory Rate 21 br/min  HI    Mean Arterial Pressure, Cuff 94 mmHg    SpO2 100 %   02/13/2020 13:08 EST Systolic Blood Pressure 155 mmHg  HI    Diastolic Blood Pressure 86 mmHg    Heart Rate Monitored 92 bpm    Respiratory Rate 19 br/min    Mean Arterial Pressure, Cuff 106 mmHg  HI    SpO2 96 %   02/13/2020 13:03 EST Temperature Oral 37.4 degC  HI    Heart Rate Monitored 95 bpm    Respiratory Rate 18 br/min  SpO2 98 %   .   Measurements   02/13/2020 13:06 EST Body Mass Index est meas 51.90 kg/m2    Body Mass Index Measured 51.90 kg/m2   02/13/2020 13:03 EST Height/Length Measured 170 cm    Weight Dosing 150 kg   .   General: Awake alert in no acute distress  Eyes: Pupils equal round reactive to light, sclera anicteric  ENT: Nares patent, moist mucous membranes,   Neck: Supple, no meningismus  Respiratory: Equal air entry bilaterally, normal work of breathing, no wheezing.  Cardiac: Regular rate, regular rhythm, normal S1, S2, 2+ equal radial pulses b/l.   Abdomen: Soft, obese,  nontender, nondistended, normal bowel sounds,   Extremities: No obvious deformity, warm well perfused, no pitting peripheral edema  Neuro: Awake alert oriented, moving all 4, intact sensation to light touch,  Skin: Warm, well perfused  Psych: Calm and cooperative      Medical Decision Making   Rationale:  Patient is here for complaints of shortness of breath and some left-sided squeezing chest discomfort. On arrival here she is awake alert vital signs initially reassuring. EKG x2 is without clear ischemic arrhythmic transfer interpretation. 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 She is anemic with a hemoglobin of 8.2 it sounds like it is been somewhat chronic for her. Have a discussion about anticoagulation we will start her on heparin drip. Given RV to LV ratio of 1.1 we will plan for admission to ICU for observation..   Documents reviewed:  Emergency department nurses' notes.   Electrocardiogram:  Emergency Provider interpretation performed by me, EKGs reviewed interpreted myself, initial EKG was reviewed at 1308, demonstrates sinus rhythm, rate of 95, there is left axis, there was some baseline artifact which affects interpretation but I did not see acute ST segment elevation MI.    A repeat EKG was obtained at 1336. Again was reviewed interpreted myself, demonstrate sinus rhythm, rate of 81, there is left axis deviation, I do not see acute st segment elevation MI. There is overall no significant change compared to previous tracing.SABRA    Results review:  Lab results : Lab View   02/13/2020 14:04 EST Estimated Creatinine Clearance 145.39 mL/min   02/13/2020 13:44 EST WBC 5.6 x10e3/mcL    RBC 4.23 x10e6/mcL    Hgb 8.3 g/dL  LOW    HCT 69.9 %  LOW    MCV 70.9 fL  LOW    MCH 19.6 pg  LOW    MCHC 27.7 g/dL  LOW    RDW 82.4 %   HI    Platelet 468 x10e3/mcL  HI    MPV 8.9 fL    Neutro Auto 47.6 %    Neutro Absolute 2.7 x10e3/mcL    Immature Grans Percent 0.2 %    Immature Grans Absolute 0.01 x10e3/mcL    Lymph Auto 45.3 %  HI    Lymph Absolute 2.5 x10e3/mcL    Mono Auto 5.6 %    Mono Absolute 0.3 x10e3/mcL    Eosinophil Percent 1.1 %    Eos Absolute 0.1 x10e3/mcL    Basophil Auto 0.2 %    Baso Absolute 0.0 x10e3/mcL    Anisocytosis Moderate    Hypochromia Moderate    PLT estimate Increased    Polychromasia Few    RBC morphology Abnormal  Target cells Few    D Dimer 1.33 mcg/mL FEU  HI    Sodium Lvl 136 mmol/L    Potassium Lvl 3.9 mmol/L    Chloride 102 mmol/L    CO2 23 mmol/L    Glucose Random 174 mg/dL  HI    BUN 7 mg/dL    Creatinine Lvl 0.7 mg/dL    AGAP 12 mmol/L    Osmolality Calc 274 mOsm/kg    Calcium Lvl 8.7 mg/dL    eGFR AA 883 fO/fpw/8.26f    eGFR Non-AA 100 mL/min/1.89m    NTproBNP 61 pg/mL    Trop T Quant <0.010 ng/mL   02/13/2020 13:15 EST COVID (SARS-CoV-2) (LIAT) Not Detected    Influenza A (LIAT) Not Detected    Influenza B (LIAT) Not Detected   .   Radiology results:  Rad Results (ST)   CT Angiography Chest w/ + w/o Contrast  ?  02/13/20 15:25:31  CTA chest: 02/13/20    INDICATION:PE suspected, high prob.    COMPARISON: Chest x-ray 02/13/2020    TECHNIQUE: PE protocol (Imaging from the thoracic inlet through the  hemidiaphragms following contrast in the pulmonary arterial phase. Axial soft  tissue and lung 5 x 5 mm images. Additional multiplanar 3-D volumetric MIP  reconstructions through the pulmonary arteries were generated and reviewed per  protocol.) CT scanning was performed using radiation dose reduction techniques  when appropriate, per system protocols.    FINDINGS: The lungs are clear without focal infiltrate. No pleural, no  pericardial effusion.  Centrally located pulmonary emboli noted within the lower lobe branches  bilaterally. Additional small nonocclusive thrombus noted within the left upper  lobe second  order branch. Thoracic aorta normal in caliber, intact. Cannot  exclude mild right heart strain. RV/LV ratio approximately 1.1. Moderate  thoracic spondylosis, no acute bony abnormality. Visualized portions adrenal  glands unremarkable.    IMPRESSION:    1. Acute appearing bilateral pulmonary emboli somewhat centrally located within  the lower lobe branches bilaterally. Suspect mild right heart strain. RV/LV  ratio approximately 1.1.  2. Clear lungs.    The peerVue module has been activated for the above report.  ?  Signed By: ELAINE PAGODA P-MD  ?  **************************************************  XR Chest 1 View Portable  ?  02/13/20 13:29:55  Chest AP: 02/13/20    INDICATION:Shortness of breath (SOB).    COMPARISON: None    FINDINGS: Markedly shallow lung volumes. Central bronchovascular crowding. No  pneumothorax or pleural fluid. No edema or focal opacity. Cardiomediastinal  contours are normal for AP technique. No evident bony abnormality.    IMPRESSION:  Shallow inspiratory effort-no acute airspace opacity.  ?  Signed By: ELAINE PAGODA P-MD  .      Reexamination/ Reevaluation   Vital signs   results included from flowsheet : Vital Signs   02/13/2020 14:47 EST Systolic Blood Pressure 132 mmHg    Diastolic Blood Pressure 75 mmHg    Heart Rate Monitored 81 bpm    Respiratory Rate 15 br/min    Mean Arterial Pressure, Cuff 97 mmHg    SpO2 99 %      Course: improving.      Procedure   Critical care note   Total time: 30 minutes spent engaged in work directly related to patient care and/ or available for direct patient care.   Critical condition(s) addressed for impending deterioration include: cardiovascular.   Management: bedside assessment, supervision of care, Interpretation (chest x-ray, electrocardiogram, blood pressure), Case review medical specialist.  Performed by: self.      Impression and Plan   Diagnosis   Anemia (ICD10-CM D64.9, Discharge, Medical)   Pulmonary embolism (ICD10-CM I26.99,  Discharge, Medical)      Calls-Consults   -  SCHURING,  CRAIG ANTHONY-DO, recommends Given the low PESI score of 51, feels she would be appropriate for stepdown/floor admission, has recommended hospitalist admission..    Plan   Condition: Guarded.    Disposition: Admit time  02/13/2020 15:57:00, Place in Observation Telemetry Unit, [place],  REBECCA T-MD.    Counseled: Patient, Regarding diagnosis, Regarding diagnostic results, Regarding treatment plan.

## 2020-02-13 NOTE — ED Notes (Signed)
ED Triage Note       ED Triage Adult Entered On:  02/13/2020 13:06 EST    Performed On:  02/13/2020 13:03 EST by Lorel Monaco               Triage   Numeric Rating Pain Scale :   6   Chief Complaint :   CP AND SOB SINCE LAST NIGHT. NONPRODUCTIVE COUGH X 2 DAYS.    [place] Mode of Arrival :   Walking   Infectious Disease Documentation :   Document assessment   Temperature Oral :   37.4 degC(Converted to: 99.3 degF)  (HI)    Heart Rate Monitored :   95 bpm   Respiratory Rate :   18 br/min   SpO2 :   98 %   Patient presentation :   None of the above   Chief Complaint or Presentation suggest infection :   Yes   Weight Dosing :   150 kg(Converted to: 330 lb 11 oz)    Height :   170 cm(Converted to: 5 ft 7 in)    Body Mass Index Dosing :   52 kg/m2   Lorel Monaco - 02/13/2020 13:03 EST   DCP GENERIC CODE   Tracking Acuity :   3   Tracking Group :   ED [employer] Tracking Group   Lorel Monaco - 02/13/2020 13:03 EST   ED General Section :   Document assessment   Pregnancy Status :   Patient denies   Approximate Last Menstrual Period :   BCP   ED Allergies Section :   Document assessment   ED Reason for Visit Section :   Document assessment   ED Quick Assessment :   Patient appears awake, alert, oriented to baseline. Skin warm and dry. Moves all extremities. Respiration even and unlabored. Appears in no apparent distress.   Lorel Monaco - 02/13/2020 13:03 EST   ID Risk Screen Symptoms   Close Contact with COVID-19 ID :   No   TB Symptom Screen :   No symptoms   Lorel Monaco - 02/13/2020 13:03 EST   Allergies   (As Of: 02/13/2020 13:06:14 EST)   Allergies (Active)   No Known Medication Allergies  Estimated Onset Date:   Unspecified ; Created By:   Lorel Monaco; Reaction Status:   Active ; Category:   Drug ; Substance:   No Known Medication Allergies ; Type:   Allergy ; Updated By:   Lorel Monaco; Reviewed Date:   02/13/2020 13:04 EST        Psycho-Social   Last 3 mo, thoughts killing self/others :    Patient denies   Right click within box for Suspected Abuse policy link. :   None   Feels Safe Where Live :   Yes   ED Behavioral Activity Rating Scale :   4 - Quiet and awake (normal level of activity)   Lorel Monaco - 02/13/2020 13:03 EST   ED Reason for Visit   (As Of: 02/13/2020 13:06:14 EST)   Problems(Active)    No Chronic Problems (Cerner  :NKP )  Name of Problem:   No Chronic Problems ; Recorder:   Lorel Monaco; Code:   NKP ; Last Updated:   02/13/2020 13:05 EST ; Life Cycle Date:   02/13/2020 ; Life Cycle Status:   Active ; Vocabulary:   Cerner  Diagnoses(Active)    Shortness of breath  Date:   02/13/2020 ; Diagnosis Type:   Reason For Visit ; Confirmation:   Complaint of ; Clinical Dx:   Shortness of breath ; Classification:   Medical ; Clinical Service:   Emergency medicine ; Code:   PNED ; Probability:   0 ; Diagnosis Code:   [id number]

## 2020-02-14 LAB — MAGNESIUM: Magnesium: 2.2 mg/dL (ref 1.6–2.6)

## 2020-02-14 LAB — FERRITIN: Ferritin: 8.3 ng/mL — ABNORMAL LOW (ref 13.0–150.0)

## 2020-02-14 LAB — HEMOGLOBIN A1C
Est. Avg. Glucose, WB: 166
Est. Avg. Glucose-calculated: 186
Hemoglobin A1C: 7.4 % — ABNORMAL HIGH (ref 4.0–6.0)

## 2020-02-14 LAB — CBC WITH AUTO DIFFERENTIAL
Absolute Baso #: 0 10*3/uL (ref 0.0–0.2)
Absolute Eos #: 0.1 10*3/uL (ref 0.0–0.5)
Absolute Lymph #: 3.3 10*3/uL — ABNORMAL HIGH (ref 1.0–3.2)
Absolute Mono #: 0.4 10*3/uL (ref 0.3–1.0)
Basophils %: 0.5 % (ref 0.0–2.0)
Eosinophils %: 1.6 % (ref 0.0–7.0)
Hematocrit: 30.5 % — ABNORMAL LOW (ref 34.0–47.0)
Hemoglobin: 8.3 g/dL — ABNORMAL LOW (ref 11.5–15.7)
Immature Grans (Abs): 0.01 10*3/uL (ref 0.00–0.06)
Immature Granulocytes: 0.2 % (ref 0.0–0.6)
Lymphocytes: 58.3 % — ABNORMAL HIGH (ref 15.0–45.0)
MCH: 20.2 pg — ABNORMAL LOW (ref 27.0–34.5)
MCHC: 27.2 g/dL — ABNORMAL LOW (ref 32.0–36.0)
MCV: 74.2 fL — ABNORMAL LOW (ref 81.0–99.0)
MPV: 8.9 fL (ref 7.2–13.2)
Monocytes: 6.1 % (ref 4.0–12.0)
NRBC Absolute: 0 10*3/uL (ref 0.000–0.012)
NRBC Automated: 0 % (ref 0.0–0.2)
Neutrophils %: 33.3 % — ABNORMAL LOW (ref 42.0–74.0)
Neutrophils Absolute: 1.9 10*3/uL (ref 1.6–7.3)
Platelets: 430 10*3/uL (ref 140–440)
RBC: 4.11 x10e6/mcL (ref 3.60–5.20)
RDW: 17.8 % — ABNORMAL HIGH (ref 11.0–16.0)
WBC: 5.7 10*3/uL (ref 3.8–10.6)

## 2020-02-14 LAB — BASIC METABOLIC PANEL
Anion Gap: 7 mmol/L (ref 2–17)
BUN: 6 mg/dL (ref 6–20)
CO2: 24 mmol/L (ref 22–29)
Calcium: 8.6 mg/dL (ref 8.6–10.0)
Chloride: 103 mmol/L (ref 98–107)
Creatinine: 0.7 mg/dL (ref 0.5–1.0)
GFR African American: 116 mL/min/{1.73_m2} (ref >=90)
GFR Non-African American: 100 mL/min/{1.73_m2} (ref >=90)
Glucose: 137 mg/dL — ABNORMAL HIGH (ref 70–99)
OSMOLALITY CALCULATED: 268 mOsm/kg — ABNORMAL LOW (ref 270–287)
Potassium: 3.6 mmol/L (ref 3.5–5.3)
Sodium: 134 mmol/L — ABNORMAL LOW (ref 135–145)

## 2020-02-14 LAB — IRON AND TIBC
Iron Saturation: 4 % — ABNORMAL LOW (ref 20–40)
Iron: 15 ug/dL — ABNORMAL LOW (ref 37–145)
TIBC: 422 ug/dL (ref 250–450)
UIBC: 407 ug/dL — ABNORMAL HIGH (ref 112.0–347.0)

## 2020-02-14 LAB — APTT
PTT: 70.5 seconds — ABNORMAL HIGH (ref 23.3–34.5)
PTT: 41.5 seconds — ABNORMAL HIGH (ref 23.3–34.5)
PTT: 70.6 seconds — ABNORMAL HIGH (ref 23.3–34.5)
PTT: 63.5 seconds — ABNORMAL HIGH (ref 23.3–34.5)

## 2020-02-14 NOTE — Letter (Signed)
Provider Letter     You have been sent a Continuity of Care Document from [facility]    This email and any files transmitted with it are confidential and intended solely for the use of the individual or entity to which they are addressed. If you are not the named addressee, please delete this email from your system and do not disseminate, distribute, or copy this information. If you are not the intended recipient, you are notified that any disclosure of this email and its contents are strictly prohibited.    Note: This is not a monitored email address. If you have received this email in error, please do not reply to this email but contact [facility] at [phone].

## 2020-02-14 NOTE — Nursing Note (Signed)
Nursing Discharge Summary - Text       Physician Discharge Summary Entered On:  02/14/2020 16:39 EST    Performed On:  02/14/2020 16:38 EST by Caren Macadam W-MD               DC Information   Provider Instructions for Diet :   A Healthy Diet, No Restrictions   Provider Instructions for Activity :   As Tolerated, Continue your regular activity   Caren Macadam W-MD - 02/14/2020 16:38 EST

## 2020-02-14 NOTE — Progress Notes (Signed)
Chaplaincy Note - Text       Chaplaincy Note Entered On:  02/14/2020 15:26 EST    Performed On:  02/14/2020 15:24 EST by Trey Sailors               Chaplaincy Consult   Faith/Denomination :   Ephriam Knuckles   Importance of Faith :   Very important   Religious Spiritual Resources :   Acceptance of illness, Divine presence, Faith community support, Family support, Hope, Spiritual well being, Support services   Religious Spiritual Practices :   Prayer   Trey Sailors - 02/14/2020 15:24 EST   Follow-Up   Chaplaincy Follow-Up Visit Notes :   The patient was very descriptive about her work and health situation but she told me that she is at peace about the whole thing and is looking forward to go home in a day or two. She told me that she is has lots of emotional support in her daughter. Pastoral care provided by pastoral presence and prayer.     Trey Sailors - 02/14/2020 15:24 EST

## 2020-02-14 NOTE — Discharge Summary (Signed)
Inpatient Patient Summary                 [facility]  [address]  [place], [place] [address]  [phone]  Patient Discharge Instructions     Name: Ann Johnson, Ann Johnson  Current Date: 02/14/2020 16:39:04  DOB: Sep 01, 1970 MRN: [id number] FIN: [id number]  Patient Address: [address] [place] [address]  Patient Phone: [phone]  Primary Care Provider:  Name: PCP,  NONE  Phone:   Immunizations Provided:      Discharge Diagnosis: 1:Pulmonary embolism; 3:Anemia  Discharged To: TO, ANTICIPATED%>  Home Treatments: TREATMENTS, ANTICIPATED%>  Devices/Equipment: EQUIPMENT REHAB%>  Post Hospital Services: HOSPITAL SERVICES%>  Professional Skilled Services: SKILLED SERVICES%>  Therapist, sports and Community Resources:                SERV AND COMM RES, ANTICIPATED%>  Mode of Discharge Transportation: TRANSPORTATION%>Ground ambulance  Discharge Orders       Comment:      Medications  During the course of your visit, your medication list was updated with the most current information. The details of those changes are reflected below:          [facility] Medications  [facility] [id number], [address] [place], [place] [id number], [phone]  apixaban (apixaban 5 mg oral tablet) Take 2 tabs BID for 7 days, then 1 tab BID thereafter. Refills: 0.  Last Dose:____________________  Medications That Were Updated - Follow Current Instructions  Other Medications  Current: albuterol (ProAir HFA 90 mcg/inh inhalation aerosol) 2 Puffs Inhale (breathe in) every 4 hours as needed shortness of breath/wheezing.  Last Dose:____________________    Medications That Have Not Changed  Other Medications  hydrocortisone topical (hydrocortisone 1% topical cream) 1 Application Topical (on the skin) every day as needed PRN.  Last Dose:____________________  ibuprofen (ibuprofen 200 mg oral tablet) 1-2 tabs Oral (given by mouth) every 6 hours as needed as needed for pain.  Last Dose:____________________  These Medications Were Removed and  Should No Longer Be Taken  norethindrone (norethindrone 5 mg oral tablet) 2 Tabs Oral (given by mouth) every day.  Stop Taking Reason: Physician Request         [facility] would like to thank you for allowing [place] to assist you with your healthcare needs. The following includes patient education materials and information regarding your injury/illness.     Ann, Johnson has been given the following list of follow-up instructions, prescriptions, and patient education materials:  Follow-up Instructions:              With: Address: When:   PCP, NONE                     It is important to always keep an active list of medications available so that you can share with other providers and manage your medications appropriately. As an additional courtesy, we are also providing you with your final active medications list that you can keep with you.           albuterol (ProAir HFA 90 mcg/inh inhalation aerosol) 2 Puffs Inhale (breathe in) every 4 hours as needed shortness of breath/wheezing.  apixaban (apixaban 5 mg oral tablet) Take 2 tabs BID for 7 days, then 1 tab BID thereafter. Refills: 0.  hydrocortisone topical (hydrocortisone 1% topical cream) 1 Application Topical (on the skin) every day as needed PRN.  ibuprofen (ibuprofen 200 mg oral tablet) 1-2 tabs Oral (given by mouth) every 6 hours as  needed as needed for pain.      Take only the medications listed above. Contact your doctor prior to taking any medications not on this list.        Discharge instructions, if any, will display below     Instructions for Diet: INSTRUCTIONS FOR DIET%>A Healthy Diet, No Restrictions  Instructions for Supplements: SUPPLEMENT INSTRUCTIONS%>  Instructions for Activity: INSTRUCTIONS FOR ACTIVITY%>As Tolerated, Continue your regular activity  Instructions for Wound Care: INSTRUCTIONS FOR WOUND CARE%>     Medication leaflets, if any, will display below    Patient education materials, if any, will display below            Discharge  Instructions for Pulmonary Embolism    A deep vein thrombosis (DVT) is a blood clot in a large vein deep in a leg, arm, or elsewhere in the body. The clot can separate from the vein, travel to the lungs and cut off blood flow. This is a pulmonary embolism (PE).??Pulmonary embolism is very serious and may cause death if the clot is large or there are multiple clots.??    ??   Home care   Taking care of yourself is very important. To help prevent more blood clots from forming, follow your healthcare provider's instructions. Do the following:      ??Take your medicines exactly??as instructed. Don?t skip doses.??If you miss a dose, call your healthcare provider and ask what you should do.????      ??Have all lab tests as recommended. This is very important when you take medicines to prevent blood clots.??      ??If your healthcare provider has instructed you to do so, wear elastic (compression stockings).      ??Get up and get moving.     ??While sitting for long periods of time, move your knees, ankles, feet, and toes.     Lifestyle changes   To help prevent problems with your heart and blood vessels, do the following:??      ??If you smoke, get help to quit. Talk with your healthcare provider about medicines and programs that can help.      ??Stay at a healthy weight.??Talk to your healthcare provider about losing weight, if you are overweight      ??Try to exercise at least 30 minutes on most days. Before starting an exercise program,??talk with your healthcare provider.??      ??When traveling by car, make frequent stops??to??get??up??and move around.      ??On long airplane rides,??get up and move around when possible.??If you can?t get up, wiggle your toes, move your ankles and tighten your calves to keep your blood moving.     Follow-up care   Make a follow-up appointment as directed. Have your lab work done as directed.    When to call your healthcare provider   Call your healthcare provider right away??if you have:      ??Pain, swelling,  and redness in your leg, arm, or other body area. These symptoms may mean another blood clot.      ??Blood in your urine     ??Bleeding with bowel movements      ??Bleeding from the nose, gums, a cut, or vagina     Call 911   Call?? 911 if you have??symptoms of a blood clot in the lungs:??      ??Chest pain     ??Trouble breathing     ??Coughing (may cough up blood)      ??Fast  heartbeat     ??Sweating     ??Fainting    Also call 911 if you have:      ??Heavy or uncontrolled bleeding. If you are taking a blood thinner, you have an increased chance of bleeding.        ?? 2000-2021 [employer] [place]. All rights reserved. This information is not intended as a substitute for professional medical care. Always follow your healthcare professional's instructions.                       Excuse from Work, [employer], or Physical Activity      ____[name]____ needs to be excused from:  __x__ Work  _____ [employer]  _____ Physical activity        _x___ He or she may return to full physical activity as of ______2/13/22_____.        Health Care Provider Name (printed): ____[facility]___________   Health Care Provider (signature): ___________________________________________  Date: ____2/10/22____  This information is not intended to replace advice given to you by your health care provider. Make sure you discuss any questions you have with your health care provider.                      IS IT A STROKE?  Act FAST and Check for these signs:    FACE                          Does the face look uneven?    ARM                          Does one arm drift down?    SPEECH                     Does their speech sound strange?    TIME                          Call 9-1-1 at any sign of stroke  Heart Attack Signs  Chest discomfort: Most heart attacks involve discomfort in the center of the chest and lasts more than a few minutes, or goes away and comes back. It can feel like uncomfortable pressure, squeezing, fullness or pain.  Discomfort in  upper body: Symptoms can include pain or discomfort in one or both arms, back, neck, jaw or stomach.  Shortness of breath: With or without discomfort.  Other signs: Breaking out in a cold sweat, nausea, or lightheaded.  Remember, MINUTES DO MATTER. If you experience any of these heart attack warning signs, call 9-1-1 to get immediate medical attention!     ---------------------------------------------------------------------------------------------------------------------  [facility] allows you to manage your health, view your test results, and retrieve your discharge documents from your hospital stay securely and conveniently from your computer.  To begin the enrollment process, visit [email or web address] Click on ???Sign up now??? under [facility].

## 2020-02-14 NOTE — Nursing Note (Signed)
Nursing Discharge Summary - Text       Nursing Discharge Summary Entered On:  02/14/2020 16:52 EST    Performed On:  02/14/2020 16:50 EST by Leavy Cella, RN, Cyndia Skeeters               DC Information   Discharge To :   Home independently   Mode of Discharge :   Wheelchair   Transportation :   Private vehicle   Accompanied By :   Osa Craver, RN, Cyndia Skeeters - 02/14/2020 16:50 EST   Education   Responsible Learner(s) :   No Data Available     Home Caregiver Present for Session :   Yes   Barriers To Learning :   None evident   Teaching Method :   Explanation, Printed materials   [place], RN, Cyndia Skeeters - 02/14/2020 16:50 EST   Post-Hospital Education Adult Grid   Activity Expectations :   [employer] understanding   Community Resources :   [employer] understanding   Diagnostic Results :   Verbalizes understanding   Disease Process :   Verbalizes understanding   Importance of Follow-Up Visits :   Verbalizes understanding   Pain Management :   Verbalizes understanding   Physical Limitations :   Verbalizes understanding   Plan of Care :   Verbalizes understanding   When to Call Health Care Provider :   [employer] understanding   Leavy Cella, RN, Cyndia Skeeters - 02/14/2020 16:50 EST   Medication Education Adult Grid   Med Dosage, Route, Scheduling :   TEFL teacher understanding   Med Generic/Brand Name, Purpose, Action :   Verbalizes understanding   Med Preadministration Procedures :   [employer] understanding   Med Teacher, music, Storage :   IT sales professional, Medication :   Verbalizes understanding   Leavy Cella, RN, Cyndia Skeeters - 02/14/2020 16:50 EST   Additional Learner(s) Present :   Daughter   Time Spent Educating Patient :   15 minutes   Osvaldo Shipper - 02/14/2020 16:50 EST

## 2020-02-14 NOTE — Discharge Summary (Signed)
Inpatient Clinical Summary             [facility]  Post-Acute Care Transfer Instructions  PERSON INFORMATION   Name: GRAINNE, KNIGHTS   MRN: [id number]    FIN#: [id number]   PHYSICIANS  Admitting Physician: Erskine Speed T-MD  Attending Physician: Erskine Speed T-MD   PCP: PCP,  NONE  Discharge Diagnosis: 1:Pulmonary embolism; 3:Anemia  Comment:       PATIENT EDUCATION INFORMATION  Instructions:             Pulmonary Embolism, Discharge Instructions; Excuse from Work, [employer], or Physical Activity with logo Misty Stanley) (PHILST)  Medication Leaflets:               Follow-up:                           With: Address: When:   PCP, NONE                               MEDICATION LIST  Medication Reconciliation at Discharge:          [facility] Medications  [facility] [id number], [address] [place], [place] [id number], [phone]  apixaban (apixaban 5 mg oral tablet) Take 2 tabs BID for 7 days, then 1 tab BID thereafter. Refills: 0.  Last Dose:____________________  Medications That Were Updated - Follow Current Instructions  Other Medications  Current: albuterol (ProAir HFA 90 mcg/inh inhalation aerosol) 2 Puffs Inhale (breathe in) every 4 hours as needed shortness of breath/wheezing.  Last Dose:____________________    Medications That Have Not Changed  Other Medications  hydrocortisone topical (hydrocortisone 1% topical cream) 1 Application Topical (on the skin) every day as needed PRN.  Last Dose:____________________  ibuprofen (ibuprofen 200 mg oral tablet) 1-2 tabs Oral (given by mouth) every 6 hours as needed as needed for pain.  Last Dose:____________________  These Medications Were Removed and Should No Longer Be Taken  norethindrone (norethindrone 5 mg oral tablet) 2 Tabs Oral (given by mouth) every day.  Stop Taking Reason: Physician Request         Patient???s Final Home Medication List Upon Discharge:           albuterol (ProAir HFA 90 mcg/inh inhalation aerosol) 2 Puffs Inhale (breathe in) every 4  hours as needed shortness of breath/wheezing.  apixaban (apixaban 5 mg oral tablet) Take 2 tabs BID for 7 days, then 1 tab BID thereafter. Refills: 0.  hydrocortisone topical (hydrocortisone 1% topical cream) 1 Application Topical (on the skin) every day as needed PRN.  ibuprofen (ibuprofen 200 mg oral tablet) 1-2 tabs Oral (given by mouth) every 6 hours as needed as needed for pain.         Comment:       ORDERS          Order Name Order Details

## 2020-02-14 NOTE — Case Communication (Signed)
 CM D/C Planning Assessment Ongoing- Text       CM Admission Assessment Entered On:  2020/02/27 13:44 EST    Performed On:  02/27/20 13:36 EST by [place],  Mona-MSW               CM Admission Assessment   CM Reason for Care Management Referral :   Assess support system, Discharge planning assessment   CM Insurance Information :   Medicaid   CM Insurance Co. Name :   [place]  Medicaid   CM Patient has PCP :   Yes   Discharge Transporation Needs :   No   CM Assessment Discussion With :   Patient   CM Name of PCP :   [facility]    CM Home/Lay Caregiver Name/Relationship :   Nichole - Mother    CM Home/Lay Caregiver Contact Number :   [phone]   CM Living Situation :   Home with no services   CM Initial Tentative Discharge Plan :   Home with family support    CM Alternate Discharge Plan :   Pending clinical course   Anticipated Hosp Related Barriers to DC :   None identified   CM Home Barriers :   None   CM Progress Note :   Feb 27, 2020 MB- Pt admitted for Anemia and Pulmonary Embolism. Per MD notes, pt with pmh of morbid obesity, borderline DM and HTN (not on meds), anemia. CM met with pt bedside, introduced self and purpose for the visit. Pt lives with her family in a [facility]. Pt's daughter was in the room as well. Pt is from [place] and is currently working in [place] for a [employer]. However, pt will be returning back to [place] at discharge. Pt hass been staying at the [facility]. Pt reports, she has  a PCP at [facility]. Pt has [place]  Medicaid. Pt is independent with her  ADLS and has no dme. No other CM discharge needs. Dispositon of needs pending clinical course. CM is available, if needed.        CM Admission Assessment Complete :   Yes   Balooch,  Mona-MSW - 02/27/2020 13:36 EST   Z Codes   Z code documentation :   Not applicable   Balooch,  Mona-MSW - 02/14/2020 13:36 EST

## 2020-02-17 ENCOUNTER — Observation Stay (HOSPITAL_COMMUNITY)
Admission: AD | Admit: 2020-02-17 | Discharge: 2020-02-17 | Disposition: A | Discharge status: Home / Self Care | Payer: Medicaid Other | Attending: Emergency Medicine | Admitting: Emergency Medicine

## 2020-02-17 ENCOUNTER — Other Ambulatory Visit: Payer: Self-pay

## 2020-02-17 ENCOUNTER — Emergency Department (HOSPITAL_COMMUNITY)
Admission: EM | Admit: 2020-02-17 | Discharge: 2020-02-17 | Disposition: A | Discharge status: Home / Self Care | Payer: Medicaid Other | Attending: Emergency Medicine | Admitting: Emergency Medicine

## 2020-02-17 ENCOUNTER — Encounter (HOSPITAL_COMMUNITY): Payer: Self-pay | Admitting: Obstetrics and Gynecology

## 2020-02-17 DIAGNOSIS — D500 Iron deficiency anemia secondary to blood loss (chronic): Secondary | ICD-10-CM | POA: Diagnosis not present

## 2020-02-17 DIAGNOSIS — N924 Excessive bleeding in the premenopausal period: Secondary | ICD-10-CM | POA: Diagnosis not present

## 2020-02-17 DIAGNOSIS — Z7901 Long term (current) use of anticoagulants: Secondary | ICD-10-CM | POA: Insufficient documentation

## 2020-02-17 DIAGNOSIS — R079 Chest pain, unspecified: Secondary | ICD-10-CM | POA: Diagnosis not present

## 2020-02-17 DIAGNOSIS — R030 Elevated blood-pressure reading, without diagnosis of hypertension: Secondary | ICD-10-CM | POA: Insufficient documentation

## 2020-02-17 DIAGNOSIS — R0789 Other chest pain: Secondary | ICD-10-CM | POA: Diagnosis not present

## 2020-02-17 DIAGNOSIS — Z20822 Contact with and (suspected) exposure to covid-19: Secondary | ICD-10-CM | POA: Insufficient documentation

## 2020-02-17 DIAGNOSIS — N920 Excessive and frequent menstruation with regular cycle: Secondary | ICD-10-CM | POA: Diagnosis present

## 2020-02-17 DIAGNOSIS — N939 Abnormal uterine and vaginal bleeding, unspecified: Secondary | ICD-10-CM | POA: Diagnosis present

## 2020-02-17 DIAGNOSIS — I2699 Other pulmonary embolism without acute cor pulmonale: Secondary | ICD-10-CM | POA: Insufficient documentation

## 2020-02-17 DIAGNOSIS — R0602 Shortness of breath: Secondary | ICD-10-CM | POA: Diagnosis not present

## 2020-02-17 DIAGNOSIS — Z5321 Procedure and treatment not carried out due to patient leaving prior to being seen by health care provider: Secondary | ICD-10-CM | POA: Diagnosis not present

## 2020-02-17 DIAGNOSIS — J45909 Unspecified asthma, uncomplicated: Secondary | ICD-10-CM | POA: Diagnosis not present

## 2020-02-17 DIAGNOSIS — D509 Iron deficiency anemia, unspecified: Secondary | ICD-10-CM | POA: Diagnosis not present

## 2020-02-17 DIAGNOSIS — E119 Type 2 diabetes mellitus without complications: Secondary | ICD-10-CM

## 2020-02-17 LAB — IRON AND TIBC
Iron: 16 ug/dL — ABNORMAL LOW (ref 28–170)
Saturation Ratios: 3 % — ABNORMAL LOW (ref 10.4–31.8)
TIBC: 580 ug/dL — ABNORMAL HIGH (ref 250–450)
UIBC: 564 ug/dL

## 2020-02-17 LAB — I-STAT BETA HCG BLOOD, ED (MC, WL, AP ONLY): I-stat hCG, quantitative: <5 m[IU]/mL (ref <5)

## 2020-02-17 LAB — DIFFERENTIAL
Abs Immature Granulocytes: 0.02 10*3/uL (ref 0.00–0.07)
Basophils Absolute: 0 10*3/uL (ref 0.0–0.1)
Basophils Relative: 0 %
Eosinophils Absolute: 0.1 10*3/uL (ref 0.0–0.5)
Eosinophils Relative: 1 %
Immature Granulocytes: 0 %
Lymphocytes Relative: 36 %
Lymphs Abs: 2.7 10*3/uL (ref 0.7–4.0)
Monocytes Absolute: 0.4 10*3/uL (ref 0.1–1.0)
Monocytes Relative: 5 %
Neutro Abs: 4.3 10*3/uL (ref 1.7–7.7)
Neutrophils Relative %: 58 %

## 2020-02-17 LAB — RETICULOCYTES
Immature Retic Fract: 17 % — ABNORMAL HIGH (ref 2.3–15.9)
RBC.: 4.09 MIL/uL (ref 3.87–5.11)
Retic Count, Absolute: 33.1 10*3/uL (ref 19.0–186.0)
Retic Ct Pct: 0.8 % (ref 0.4–3.1)

## 2020-02-17 LAB — RESP PANEL BY RT-PCR (FLU A&B, COVID) ARPGX2
Influenza A by PCR: NEGATIVE
Influenza B by PCR: NEGATIVE
SARS Coronavirus 2 by RT PCR: NEGATIVE

## 2020-02-17 LAB — COMPREHENSIVE METABOLIC PANEL
ALT: 20 U/L (ref 0–44)
AST: 21 U/L (ref 15–41)
Albumin: 3.2 g/dL — ABNORMAL LOW (ref 3.5–5.0)
Alkaline Phosphatase: 43 U/L (ref 38–126)
Anion gap: 10 (ref 5–15)
BUN: 7 mg/dL (ref 6–20)
CO2: 25 mmol/L (ref 22–32)
Calcium: 8.5 mg/dL — ABNORMAL LOW (ref 8.9–10.3)
Chloride: 100 mmol/L (ref 98–111)
Creatinine, Ser: 0.78 mg/dL (ref 0.44–1.00)
GFR, Estimated: >60 mL/min (ref >60)
Glucose, Bld: 201 mg/dL — ABNORMAL HIGH (ref 70–99)
Potassium: 3.5 mmol/L (ref 3.5–5.1)
Sodium: 135 mmol/L (ref 135–145)
Total Bilirubin: 0.3 mg/dL (ref 0.3–1.2)
Total Protein: 7.7 g/dL (ref 6.5–8.1)

## 2020-02-17 LAB — TYPE AND SCREEN
ABO/RH(D): A POS
Antibody Screen: NEGATIVE

## 2020-02-17 LAB — CBC
HCT: 30.7 % — ABNORMAL LOW (ref 36.0–46.0)
HCT: 28.6 % — ABNORMAL LOW (ref 36.0–46.0)
Hemoglobin: 8.2 g/dL — ABNORMAL LOW (ref 12.0–15.0)
Hemoglobin: 8 g/dL — ABNORMAL LOW (ref 12.0–15.0)
MCH: 19.8 pg — ABNORMAL LOW (ref 26.0–34.0)
MCH: 20.4 pg — ABNORMAL LOW (ref 26.0–34.0)
MCHC: 26.7 g/dL — ABNORMAL LOW (ref 30.0–36.0)
MCHC: 28 g/dL — ABNORMAL LOW (ref 30.0–36.0)
MCV: 74 fL — ABNORMAL LOW (ref 80.0–100.0)
MCV: 72.8 fL — ABNORMAL LOW (ref 80.0–100.0)
Platelets: 455 10*3/uL — ABNORMAL HIGH (ref 150–400)
Platelets: 410 10*3/uL — ABNORMAL HIGH (ref 150–400)
RBC: 4.15 MIL/uL (ref 3.87–5.11)
RBC: 3.93 MIL/uL (ref 3.87–5.11)
RDW: 17.8 % — ABNORMAL HIGH (ref 11.5–15.5)
RDW: 17.6 % — ABNORMAL HIGH (ref 11.5–15.5)
WBC: 7.6 10*3/uL (ref 4.0–10.5)
WBC: 7.5 10*3/uL (ref 4.0–10.5)
nRBC: 0 % (ref 0.0–0.2)
nRBC: 0 % (ref 0.0–0.2)

## 2020-02-17 LAB — FERRITIN: Ferritin: 6 ng/mL — ABNORMAL LOW (ref 11–307)

## 2020-02-17 LAB — HIV ANTIBODY (ROUTINE TESTING W REFLEX): HIV Screen 4th Generation wRfx: NONREACTIVE

## 2020-02-17 LAB — SAVE SMEAR(SSMR), FOR PROVIDER SLIDE REVIEW

## 2020-02-17 MED ORDER — APIXABAN 5 MG PO TABS: 10.0000 mg | ORAL_TABLET | Freq: Two times a day (BID) | ORAL | Status: DC

## 2020-02-17 MED ORDER — POLYETHYLENE GLYCOL 3350 17 G PO PACK: 17.0000 g | PACK | Freq: Every day | ORAL | Status: DC | PRN

## 2020-02-17 NOTE — MAU Note (Signed)
Pt reports vaginal bleeding x 3 days, changing a pad q one hour. Just discharged form hospital s/p pulmonary embolism. Pt started on Heparin this past weekend. Also reports left sided chest pain that started when she awakened this am.

## 2020-02-17 NOTE — ED Notes (Signed)
Attempted to call report

## 2020-02-17 NOTE — ED Notes (Signed)
Dinner Tray Ordered @ [phone].

## 2020-02-17 NOTE — Discharge Summary (Shared)
   Name: Jaime Castaneda MRN: [id number] DOB: [birth year] 50 y.o. PCP: Patient, No Pcp Per  Date of Admission: 02/17/2020 12:04 PM Date of Discharge:  Attending Physician: Velna Ochs, MD  Discharge Diagnosis: 1. ***  Discharge Medications: Allergies as of 02/17/2020      Reactions   Cephalexin Nausea And Vomiting, Nausea Only   Moxifloxacin Nausea And Vomiting, Nausea Only    Med Rec must be completed prior to using this [facility]***       Disposition and follow-up:   Jaime Castaneda was discharged from [facility] in {DISCHARGE CONDITION:19696} condition.  At the hospital follow up visit please address:  1.  ***  2.  Labs / imaging needed at time of follow-up: ***  3.  Pending labs/ test needing follow-up: ***  Follow-up Appointments:   Hospital Course by problem list: 1. ***  Discharge Exam:   BP (!) 141/73   Pulse 84   Temp 98.8 F (37.1 C) (Oral)   Resp 18   Ht 5\' 7"  (1.702 m)   Wt (!) 155.1 kg   SpO2 98%   BMI 53.56 kg/m  Discharge exam: ***  Pertinent Labs, Studies, and Procedures:  ***  Discharge Instructions:   SignedDelice Bison, DO 02/17/2020, 8:45 PM   Pager: [phone]

## 2020-02-17 NOTE — ED Triage Notes (Signed)
Pt reports being discharged from hospital on Friday following PE. Pt was started on eliquis, started having vaginal bleeding on Friday which has gotten progressively worse. Reports going through tampon and multiple pads every hours.

## 2020-02-17 NOTE — MAU Provider Note (Signed)
[name] is a 50 y.o. G29P2002 female who presents to MAU today with complaint of CP, SOB, and VB. Reports onset of VB 2 days ago. She was seen 3 days ago in [facility] for CP and SOB and dx with PE and started on heparin and Eloquis. Reports saturating a pad every hour. She was taking Aygestin for AUB. Hx of BTL. She has no concerns for pregnancy.  ROS: +SOB +CP +VB  O BP (!) 162/106 (BP Location: Right Arm)   Pulse 87   Resp 20   Ht 5\' 7"  (1.702 m)   Wt (!) 155.1 kg   SpO2 100%   BMI 53.56 kg/m  Physical Exam Vitals and nursing note reviewed.  Constitutional:      General: She is not in acute distress.    Appearance: Normal appearance.  HENT:     Head: Normocephalic and atraumatic.  Cardiovascular:     Rate and Rhythm: Normal rate.  Pulmonary:     Effort: Pulmonary effort is normal. No respiratory distress.  Skin:    General: Skin is warm and dry.  Neurological:     General: No focal deficit present.     Mental Status: She is alert and oriented to person, place, and time.  Psychiatric:        Mood and Affect: Mood normal.        Behavior: Behavior normal.   MDM: No concerns for pregnancy. Discussed presentation and clinical findings with Dr. Ashok Cordia in ED, stable for transfer.  A Chest pain SOB Vaginal bleeding  P Transfer to [facility] for further evaluation   Patient may return to MAU as needed for pregnancy related complaints  Julianne Handler, CNM 02/17/2020 12:30 PM

## 2020-02-17 NOTE — ED Notes (Signed)
AMA discussion had with pt by doctor and myself.  Pt understands risks and has signed AMA form.  PT DC,.

## 2020-02-17 NOTE — ED Provider Notes (Signed)
[facility] EMERGENCY DEPARTMENT Provider Note   [name]: [id number] Arrival date & time: 02/17/20  1204     History Chief Complaint  Patient presents with  . Vaginal Bleeding  . Chest Pain  . Shortness of Breath    Jaime Castaneda is a 50 y.o. female.  HPI 50 year old female with a history of anemia, depression, fibroids, GERD, obesity presents to the ER with complaints of heavy vaginal bleeding.  Patient states that she has been started on OCPs by her OB/GYN for vaginal bleeding Av[place] months ago.  States that they were working well for her.  She states that she was traveling to and from work in [place] in [place], when she started to develop some chest pain and shortness of breath on Thursday.  She was seen at a clinic on Friday, and states that they diagnosed her with a PE.  She was admitted for 1 night, started on heparin, and then was given a prescription for Eliquis.  She states that she finished a heparin on Saturday, and began Eliquis on the same day.  She reports onset of heavy vaginal bleeding with large clots on Friday.  She reports going through a tampon and 2 pads every hour.  She has reports a history of anemia, has had some dizziness and shortness of breath on exertion.  Denies any current chest pain.  She attempted to contact her OB/GYN, but was told to come to the ER for further evaluation.    Past Medical History:  Diagnosis Date  . Anemia   . Anxiety   . Arthritis of knee    bilateral  . Asthma   . Back pain   . CFS (chronic fatigue syndrome)   . Constipation   . Depression   . Dyspnea   . Fibroid   . GERD (gastroesophageal reflux disease)   . Joint pain   . Lower extremity edema   . Metabolic syndrome   . Migraine   . Obesity   . Prediabetes   . Scleritis   . TMJ arthralgia   . Vitamin D deficiency     Patient Active Problem List   Diagnosis Date Noted  . Other chest pain   . Shortness of breath   . Vaginal bleeding   .  Morbid obesity ([place]) 06/27/2015  . Severe headache 06/27/2015  . Vision changes 06/27/2015  . Metabolic syndrome 29/92/4268  . Migraine 06/27/2015    Past Surgical History:  Procedure Laterality Date  . ABLATION     uterine  . SHOULDER SURGERY    . TUBAL LIGATION  2003     OB History    Gravida  2   Para  2   Term  2   Preterm      AB      Living  2     SAB      IAB      Ectopic      Multiple      Live Births              Family History  Problem Relation Age of Onset  . Diabetes Mother   . Hypertension Mother   . Heart disease Mother   . Depression Mother   . Anxiety disorder Mother   . Obesity Mother   . Heart disease Other   . Thyroid disease Other   . Bone cancer Other   . High blood pressure Other   . Migraines Neg Hx  Social History   Tobacco Use  . Smoking status: Never Smoker  . Smokeless tobacco: Never Used  Vaping Use  . Vaping Use: Never used  Substance Use Topics  . Alcohol use: No  . Drug use: No    Home Medications Prior to Admission medications   Not on File    Allergies    Cephalexin and Moxifloxacin  Review of Systems   Review of Systems  Constitutional: Negative for chills and fever.  HENT: Negative for ear pain and sore throat.   Eyes: Negative for pain and visual disturbance.  Respiratory: Positive for shortness of breath. Negative for cough.   Cardiovascular: Negative for chest pain and palpitations.  Gastrointestinal: Negative for abdominal pain and vomiting.  Genitourinary: Positive for vaginal bleeding. Negative for dysuria and hematuria.  Musculoskeletal: Negative for arthralgias and back pain.  Skin: Negative for color change and rash.  Neurological: Positive for dizziness and light-headedness. Negative for seizures and syncope.  All other systems reviewed and are negative.   Physical Exam Updated Vital Signs BP (!) 157/83   Pulse 80   Temp 98.8 F (37.1 C) (Oral)   Resp 18   Ht 5\' 7"   (1.702 m)   Wt (!) 155.1 kg   SpO2 100%   BMI 53.56 kg/m   Physical Exam Vitals and nursing note reviewed.  Constitutional:      General: She is not in acute distress.    Appearance: She is well-developed and well-nourished.  HENT:     Head: Normocephalic and atraumatic.  Eyes:     Conjunctiva/sclera: Conjunctivae normal.  Cardiovascular:     Rate and Rhythm: Normal rate and regular rhythm.     Heart sounds: Normal heart sounds. No murmur heard.   Pulmonary:     Effort: Pulmonary effort is normal. No respiratory distress.     Breath sounds: No decreased breath sounds.  Abdominal:     Palpations: Abdomen is soft.     Tenderness: There is no abdominal tenderness.  Musculoskeletal:        General: No edema.     Cervical back: Neck supple.     Right lower leg: No tenderness. No edema.     Left lower leg: No tenderness. No edema.  Skin:    General: Skin is warm and dry.  Neurological:     General: No focal deficit present.     Mental Status: She is alert.  Psychiatric:        Mood and Affect: Mood and affect and mood normal.        Behavior: Behavior normal.     ED Results / Procedures / Treatments   Labs (all labs ordered are listed, but only abnormal results are displayed) Labs Reviewed  COMPREHENSIVE METABOLIC PANEL - Abnormal; Notable for the following components:      Result Value   Glucose, Bld 201 (*)    Calcium 8.5 (*)    Albumin 3.2 (*)    All other components within normal limits  CBC - Abnormal; Notable for the following components:   Hemoglobin 8.2 (*)    HCT 30.7 (*)    MCV 74.0 (*)    MCH 19.8 (*)    MCHC 26.7 (*)    RDW 17.8 (*)    Platelets 455 (*)    All other components within normal limits  RESP PANEL BY RT-PCR (FLU A&B, COVID) [id number]  I-STAT BETA HCG BLOOD, ED ([facility], [facility], [facility] ONLY)  I-STAT BETA HCG BLOOD, ED (  [facility], [facility], [facility] ONLY)  TYPE AND SCREEN    EKG None  Radiology No results found.  Procedures Procedures   Medications Ordered in  ED Medications - No data to display  ED Course  I have reviewed the triage vital signs and the nursing notes.  Pertinent labs & imaging results that were available during my care of the patient were reviewed by me and considered in my medical decision making (see chart for details).    MDM Rules/Calculators/A&P                          50 year old female presents with vaginal bleeding in the setting of recent Eliquis initiation.  On arrival, she is well-appearing, resting comfortably in the ER bed.  Mildly hypertensive with a blood pressure 162/106, other vitals are reassuring.  Physical exam with soft and nontender abdomen.  Patient deferred pelvic exam, low suspicion for PID, TOA, ovarian torsion.  Suspect her bleeding is secondary to her fibroids and recent initiation of blood thinners.  Triage notes note chest pain, however patient denies this to me.  Labs ordered in triage, reviewed and interpreted by me.  CBC with a hemoglobin of 8.2, CMP without any significant abnormalities noted.  Pregnancy negative.  Spoke with Suezanne Jacquet PharmD in the ED, unfortunately the patient will need to remain on Eliquis for at least 7 days.  There is concern that if we send her home, her bleeding will get worse and she will get severely anemic.  Plan to Covid test, admit for observation.  Consulted hospitalist team who will admit the patient for further evaluation and treatment.  Final Clinical Impression(s) / ED Diagnoses Final diagnoses:  Vaginal bleeding    Rx / DC Orders ED Discharge Orders    None       Lyndel Safe 02/17/20 1523    Daleen Bo, MD 02/17/20 1540

## 2020-02-17 NOTE — Progress Notes (Signed)
[facility] Progress Note:  Received a page stating that patient is requesting to go home. I called the patient over the phone and discussed that her blood counts are low to the point she may benefit from IV iron transfusion +/- RBC transfusion if her blood counts were to drop further. She does have vaginal bleeding that is ongoing currently, although states she is frustrated she has not yet gotten a hospital bed and feels that she is not receiving adequate help in cleaning herself up. Explained that she would benefit from staying overnight for further monitoring and possible transfusions. Patient understands the risk of leaving and I explained to her that she would be leaving against medical advice, although patient continues to wish to leave.   Patient will be leaving AMA.   Jeralyn Bennett, MD 02/17/2020, 8:43 PM Pager: [phone]

## 2020-02-17 NOTE — ED Notes (Signed)
[facility] called and reported that pt could not come to her assigned room due to staffing issues. Charge nurse made aware, pt made aware.

## 2020-02-17 NOTE — H&P (Addendum)
Date: 02/17/2020               Patient Name:  Jaime Castaneda MRN: [id number]  DOB: 04-11-1970 Age / Sex: 50 y.o., female   PCP: Patient, No Pcp Per         Medical Service: Internal Medicine Teaching Service         Attending Physician: Dr. Philipp Ovens    First Contact: Dr. Darrick Meigs Pager: [phone]  Second Contact: Dr. Coy Saunas Pager: [phone]       After Hours (After 5p/  First Contact Pager: [phone]  weekends / holidays): Second Contact Pager: [phone]   Chief Complaint: Vaginal Bleeding   History of Present Illness: Jaime Castaneda is a 50 year old very pleasant African-American woman with medical history significant for abnormal uterine bleeding, iron deficiency anemia requiring transfusions in the past, uterine fibroids, morbid obesity and GERD presenting to the emergency department with heavy vaginal bleeding.  Per chart review, patient has been following up with her OB/GYN and was last evaluated in December 2021 where Jaime Castaneda was discovered to not have responded to hormone therapy to control her vaginal bleeding.  In July 2021, Jaime Castaneda was on Prometrium which was changed to Provera and currently on norethindrone.  During that visit, her hemoglobin was 8.9.  Pelvic ultrasound performed on December 2021 showed anteverted uterus with multiple fibroids, endometrial stripe of 1.2 cm, largest fibroid measuring 5.5 cm.  There has been discussions about hysterectomy however patient was amenable to trying medical management first.  Jaime Castaneda was recently diagnosed with pulmonary embolism in [place] on Friday, February 14, 2019 when Jaime Castaneda presented to the Clinic/Hospital with chest pain and shortness of breath.  Jaime Castaneda was initially started on heparin infusion and started Eliquis starter pack on Saturday.  Since then, Jaime Castaneda reports that Jaime Castaneda has been noticing more than usual heavy vaginal bleeding with some clots. Jaime Castaneda is changing pads consistently. This morning, Jaime Castaneda has had to wear a large pad and several  tampons today. Jaime Castaneda states that her appointmet with her PCP and OBGYN is further out in February and March and felt Jaime Castaneda had to see a medical provider  On review of systems, Jaime Castaneda feels weak, dizzy, "feeling of trying to pull her body," constant left sided discomfort since Thursday, lower abdominal cramping due to heavy bleeding denies back pain, calf pain, tingling, numbness, hemoptysis, hematochezia, hematemesis.    ED course: BP 140s-160s/80s-100s otherwise unremarkable.  CBC shows hemoglobin of 8.2 (baseline 8.9 from December 2021), hematocrit 30.7, MCV 74, platelets 455   Lab Orders     Resp Panel by RT-PCR (Flu A&B, Covid) Nasopharyngeal Swab     Comprehensive metabolic panel     CBC     Reticulocytes     Iron and TIBC     Ferritin     HIV Antibody (routine testing w rflx)     CBC     Basic metabolic panel     Save Smear     I-Stat beta hCG blood, ED     I-Stat beta hCG blood, ED   Meds:  Current Meds  Medication Sig  . albuterol (VENTOLIN HFA) 108 (90 Base) MCG/ACT inhaler Inhale 2 puffs into the lungs every 6 (six) hours as needed for wheezing or shortness of breath.  . Apixaban Starter Pack, 10mg  and 5mg , (ELIQUIS DVT/PE STARTER PACK) Take 5-10 mg by mouth See admin instructions. Take 10 mg by mouth two times a day for 7 days, then decrease to 5 mg two  times a day on the 8th day  . naproxen sodium (ALEVE) 220 MG tablet Take 220-440 mg by mouth 2 (two) times daily as needed (for pain or headaches).  . norethindrone (AYGESTIN) 5 MG tablet Take 10 mg by mouth daily.     Allergies: Allergies as of 02/17/2020 - Review Complete 02/17/2020  Allergen Reaction Noted  . Cephalexin Nausea And Vomiting and Nausea Only 02/14/2014  . Moxifloxacin Nausea And Vomiting and Nausea Only 02/14/2014   Past Medical History:  Diagnosis Date  . Anemia   . Anxiety   . Arthritis of knee    bilateral  . Asthma   . Back pain   . CFS (chronic fatigue syndrome)   . Constipation   . Depression    . Dyspnea   . Fibroid   . GERD (gastroesophageal reflux disease)   . Joint pain   . Lower extremity edema   . Metabolic syndrome   . Migraine   . Obesity   . Prediabetes   . Scleritis   . TMJ arthralgia   . Vitamin D deficiency     Family History: Several family members that has had hysterectomies due to fibroids. CHF   Social History: Divorced, Lives with daughter. 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 Occasional EtOH use, denies illicit drug use or smoking cigarettes  Review of Systems: A complete ROS was negative except as per HPI.   Physical Exam: Blood pressure (!) 159/77, pulse 79, temperature 98.8 F (37.1 C), temperature source Oral, resp. rate 19, height 5\' 7"  (1.702 m), weight (!) 155.1 kg, SpO2 100 %. Physical Exam Vitals and nursing note reviewed.  Constitutional:      General: Jaime Castaneda is not in acute distress.    Appearance: Jaime Castaneda is obese. Jaime Castaneda is not ill-appearing.  HENT:     Head: Normocephalic and atraumatic.  Cardiovascular:     Rate and Rhythm: Normal rate and regular rhythm.     Heart sounds: Normal heart sounds. No murmur heard.  No systolic murmur is present.  No diastolic murmur is present.   Pulmonary:     Effort: Pulmonary effort is normal.     Breath sounds: No decreased breath sounds, wheezing, rhonchi or rales.  Chest:     Chest wall: No mass, deformity or tenderness.  Abdominal:     General: Bowel sounds are normal.     Palpations: Abdomen is soft.  Musculoskeletal:     Right lower leg: No edema.     Left lower leg: No edema.  Skin:    General: Skin is warm.     Coloration: Skin is not pale.  Neurological:     Mental Status: Jaime Castaneda is alert.  Psychiatric:        Mood and Affect: Mood normal. Mood is not anxious.        Behavior: Behavior normal. Behavior is not agitated.    EKG: personally reviewed my interpretation is SR  Assessment & Plan by  Problem: Principal Problem:   Menorrhagia Active Problems:   Pulmonary emboli (HCC)   Iron deficiency anemia   Jaime Castaneda is a 50 year old woman with medical history significant for abnormal uterine bleeding, uterine fibroids, iron deficiency anemia requiring blood transfusions in the past and recently diagnosed pulmonary embolism on Eliquis presenting with menorrhagia.   #Menorrhagia #AUB Jaime Castaneda denies any family history of bleeding diathesis or mucosal bleeding and would thus not pursue  coagulation work-up.  In addition, her platelet count is consistent with iron deficiency.  Her ongoing menorrhagia/AUB is secondary to longstanding uterine fibroids and enlarged uterus.  From a gynecological standpoint, Jaime Castaneda has been on several hormonal therapy including Prometrium, Provera and currently on norethindrone.  The most definitive therapy would be hysterectomy for which Jaime Castaneda is in discussion with her outpatient OB/GYN -Monitor hemoglobin and symptoms closely   #Provoked pulmonary embolism in the setting of hormone therapy for a AUB Hgb at baseline 8.9 (12/2019) and 10.7 (03/2019). Hgb today is 8.2.  The benefit of continuing anticoagulation in the setting of recently acute pulmonary embolism most definitely is quite imminent given her stable hemoglobin compared to her numbers in December. -Resume Eliquis 10 mg twice daily x6 days while keeping a close eye on hemoglobin and symptoms -Discontinue all OCPs (Norethindrone) -Follow-up daily CBC and transfuse with hemoglobin <7 g/dL   #Microcytic anemia #Iron deficiency anemia Iron studies in March 2021 showed iron 26, TIBC 481, ferritin 8, iron saturation 5 -Follow-up daily CBC and transfuse with hemoglobin <7 g/dL -Follow-up reticulocyte count, Iron, TIBC, Ferritin   **If iron deficient, will benefit from iron supplements (either oral or IV)   #Elevated Blood Pressure Reading  Currently asymptomatic -Continue to monitor   #Diabetes  mellitus Jaime Castaneda had hemoglobin A1c performed March 2021 which resulted at 6.4% -Repeat hemoglobin A1c    FEN: Regular diet VTE ppx: Eliquis 10 mg twice daily x6 days then 5 mg twice daily afterwards CODE STATUS: Full code  Prior to Admission Living Arrangement: Home Anticipated Discharge Location: Home Barriers to Discharge: Treatment of pulmonary embolism, menorrhagia, anemia  Dispo: Admit patient to Observation with expected length of stay less than 2 midnights.  Signed: Jean Rosenthal, MD 02/17/2020, 4:31 PM  Pager: [phone] Internal Medicine Teaching Service After 5pm on weekdays and 1pm on weekends: On Call pager: [phone]

## 2020-02-17 NOTE — MAU Note (Signed)
Spoke with Sara,RN , alerted to pt complaint of chest pain, shortness of breath. Notified pt hopitalized over the weekend in [facility] for pulmonary embolism. Pt states she is also having heavy vaginal bleeding she thinks due to the heparin she was given in the hospital. Pulse 87 range, O2 sat 100%, b/p 160/109. Skin w/d.

## 2020-02-18 LAB — HEMOGLOBIN A1C
Hgb A1c MFr Bld: 7.1 % — ABNORMAL HIGH (ref 4.8–5.6)
Mean Plasma Glucose: 157 mg/dL

## 2020-03-02 DIAGNOSIS — Z86711 Personal history of pulmonary embolism: Secondary | ICD-10-CM | POA: Insufficient documentation

## 2020-03-02 DIAGNOSIS — E113293 Type 2 diabetes mellitus with mild nonproliferative diabetic retinopathy without macular edema, bilateral: Secondary | ICD-10-CM | POA: Insufficient documentation

## 2020-03-16 DIAGNOSIS — I10 Essential (primary) hypertension: Secondary | ICD-10-CM | POA: Insufficient documentation

## 2020-03-16 DIAGNOSIS — R0683 Snoring: Secondary | ICD-10-CM | POA: Insufficient documentation

## 2020-03-16 DIAGNOSIS — E538 Deficiency of other specified B group vitamins: Secondary | ICD-10-CM | POA: Insufficient documentation

## 2020-03-16 DIAGNOSIS — K219 Gastro-esophageal reflux disease without esophagitis: Secondary | ICD-10-CM | POA: Insufficient documentation

## 2020-03-24 DIAGNOSIS — R58 Hemorrhage, not elsewhere classified: Secondary | ICD-10-CM | POA: Insufficient documentation

## 2020-04-17 ENCOUNTER — Emergency Department (HOSPITAL_BASED_OUTPATIENT_CLINIC_OR_DEPARTMENT_OTHER)
Admission: EM | Admit: 2020-04-17 | Discharge: 2020-04-18 | Disposition: A | Discharge status: Home / Self Care | Payer: Medicaid Other | Attending: Emergency Medicine | Admitting: Emergency Medicine

## 2020-04-17 ENCOUNTER — Emergency Department (HOSPITAL_BASED_OUTPATIENT_CLINIC_OR_DEPARTMENT_OTHER): Payer: Medicaid Other

## 2020-04-17 ENCOUNTER — Other Ambulatory Visit: Payer: Self-pay

## 2020-04-17 ENCOUNTER — Encounter (HOSPITAL_BASED_OUTPATIENT_CLINIC_OR_DEPARTMENT_OTHER): Payer: Self-pay | Admitting: Emergency Medicine

## 2020-04-17 DIAGNOSIS — K529 Noninfective gastroenteritis and colitis, unspecified: Secondary | ICD-10-CM

## 2020-04-17 DIAGNOSIS — R112 Nausea with vomiting, unspecified: Secondary | ICD-10-CM | POA: Diagnosis not present

## 2020-04-17 DIAGNOSIS — Z7901 Long term (current) use of anticoagulants: Secondary | ICD-10-CM | POA: Insufficient documentation

## 2020-04-17 DIAGNOSIS — J45909 Unspecified asthma, uncomplicated: Secondary | ICD-10-CM | POA: Insufficient documentation

## 2020-04-17 DIAGNOSIS — R1013 Epigastric pain: Secondary | ICD-10-CM | POA: Diagnosis present

## 2020-04-17 DIAGNOSIS — R42 Dizziness and giddiness: Secondary | ICD-10-CM | POA: Diagnosis not present

## 2020-04-17 DIAGNOSIS — N939 Abnormal uterine and vaginal bleeding, unspecified: Secondary | ICD-10-CM | POA: Insufficient documentation

## 2020-04-17 DIAGNOSIS — D259 Leiomyoma of uterus, unspecified: Secondary | ICD-10-CM

## 2020-04-17 LAB — CBC WITH DIFFERENTIAL/PLATELET
Abs Immature Granulocytes: 0.04 10*3/uL (ref 0.00–0.07)
Basophils Absolute: 0 10*3/uL (ref 0.0–0.1)
Basophils Relative: 0 %
Eosinophils Absolute: 0 10*3/uL (ref 0.0–0.5)
Eosinophils Relative: 0 %
HCT: 46.5 % — ABNORMAL HIGH (ref 36.0–46.0)
Hemoglobin: 12.9 g/dL (ref 12.0–15.0)
Immature Granulocytes: 0 %
Lymphocytes Relative: 26 %
Lymphs Abs: 2.4 10*3/uL (ref 0.7–4.0)
MCH: 21.8 pg — ABNORMAL LOW (ref 26.0–34.0)
MCHC: 27.7 g/dL — ABNORMAL LOW (ref 30.0–36.0)
MCV: 78.5 fL — ABNORMAL LOW (ref 80.0–100.0)
Monocytes Absolute: 0.3 10*3/uL (ref 0.1–1.0)
Monocytes Relative: 4 %
Neutro Abs: 6.4 10*3/uL (ref 1.7–7.7)
Neutrophils Relative %: 70 %
Platelets: 563 10*3/uL — ABNORMAL HIGH (ref 150–400)
RBC: 5.92 MIL/uL — ABNORMAL HIGH (ref 3.87–5.11)
RDW: 27.2 % — ABNORMAL HIGH (ref 11.5–15.5)
WBC: 9.3 10*3/uL (ref 4.0–10.5)
nRBC: 0 % (ref 0.0–0.2)

## 2020-04-17 LAB — COMPREHENSIVE METABOLIC PANEL
ALT: 13 U/L (ref 0–44)
AST: 15 U/L (ref 15–41)
Albumin: 4 g/dL (ref 3.5–5.0)
Alkaline Phosphatase: 46 U/L (ref 38–126)
Anion gap: 11 (ref 5–15)
BUN: 11 mg/dL (ref 6–20)
CO2: 24 mmol/L (ref 22–32)
Calcium: 9.2 mg/dL (ref 8.9–10.3)
Chloride: 103 mmol/L (ref 98–111)
Creatinine, Ser: 1.2 mg/dL — ABNORMAL HIGH (ref 0.44–1.00)
GFR, Estimated: 55 mL/min — ABNORMAL LOW (ref >60)
Glucose, Bld: 152 mg/dL — ABNORMAL HIGH (ref 70–99)
Potassium: 3.8 mmol/L (ref 3.5–5.1)
Sodium: 138 mmol/L (ref 135–145)
Total Bilirubin: 0.5 mg/dL (ref 0.3–1.2)
Total Protein: 7.9 g/dL (ref 6.5–8.1)

## 2020-04-17 LAB — LIPASE, BLOOD: Lipase: 13 U/L (ref 11–51)

## 2020-04-17 MED ORDER — MORPHINE SULFATE (PF) 4 MG/ML IV SOLN
4.0000 mg | Freq: Once | INTRAVENOUS | Status: AC
  Administered 2020-04-17: 4 mg via INTRAVENOUS
  Filled 2020-04-17: qty 1

## 2020-04-17 MED ORDER — IOHEXOL 300 MG/ML  SOLN
100.0000 mL | Freq: Once | INTRAMUSCULAR | Status: AC | PRN
  Administered 2020-04-17: 100 mL via INTRAVENOUS

## 2020-04-17 MED ORDER — ONDANSETRON HCL 4 MG/2ML IJ SOLN
4.0000 mg | Freq: Once | INTRAMUSCULAR | Status: AC
  Administered 2020-04-17: 4 mg via INTRAVENOUS
  Filled 2020-04-17: qty 2

## 2020-04-17 NOTE — ED Provider Notes (Signed)
Care of the patient assumed at the change of shift. Here with vomiting, abdominal discomfort. CT shows significant enteritis and numerous uterine fibroids (patient aware of this, planning hysterectomy in a few weeks). She reports co-workers have been sick with GI illness recently.  Physical Exam  BP 99/63 (BP Location: Right Arm)   Pulse 75   Temp 98.1 F (36.7 C) (Oral)   Resp 20   Ht 5\' 7"  (1.702 m)   Wt (!) 155.1 kg   SpO2 98%   BMI 53.56 kg/m   Physical Exam  ED Course/Procedures   Clinical Course as of 04/17/20 2352  Fri Apr 17, 2020  2159 Lipase is normal.  Metabolic panel is unremarkable.  CBC is normal. [JK]  2159 Patient symptoms are concerning for the possibility obstruction.  We will proceed with CT scan [JK]    Clinical Course User Index [JK] Dorie Rank, MD    Procedures  MDM  Patient feeling better, awaiting UA then anticipate discharge. Given extent of enteritis on CT, plan treatment with Abx to cover bacterial source.      Truddie Hidden, MD 04/18/20 [phone]

## 2020-04-17 NOTE — ED Provider Notes (Signed)
[facility] Provider Note   [name]: [id number] Arrival date & time: 04/17/20  2003     History Chief Complaint  Patient presents with  . Abdominal Pain  . Nausea    Jaime Castaneda is a 50 y.o. female.  HPI   Patient presented to the ED for evaluation of upper abdominal pain.  Patient states the symptoms started a couple days ago.  However today while she was at work she started having increasing pain in her upper abdomen that became more severe.  She started to feel nauseous and lightheaded.  Patient felt like she might pass out.  She does have history of uterine fibroids and is on anticoagulants for prior PE.  She is having persistent vaginal bleeding.  Patient has a large fibroid and is scheduled for surgery on May 10.  She does feel like she has noticed more bleeding in the past week  Past Medical History:  Diagnosis Date  . Anemia   . Anxiety   . Arthritis of knee    bilateral  . Asthma   . Back pain   . CFS (chronic fatigue syndrome)   . Constipation   . Depression   . Dyspnea   . Fibroid   . GERD (gastroesophageal reflux disease)   . Joint pain   . Lower extremity edema   . Metabolic syndrome   . Migraine   . Obesity   . Prediabetes   . Scleritis   . TMJ arthralgia   . Vitamin D deficiency     Patient Active Problem List   Diagnosis Date Noted  . Menorrhagia 02/17/2020  . Pulmonary emboli ([place]) 02/17/2020  . Iron deficiency anemia 02/17/2020  . Other chest pain   . Shortness of breath   . Vaginal bleeding   . Morbid obesity ([place]) 06/27/2015  . Severe headache 06/27/2015  . Vision changes 06/27/2015  . Metabolic syndrome 62/22/9798  . Migraine 06/27/2015    Past Surgical History:  Procedure Laterality Date  . ABLATION     uterine  . SHOULDER SURGERY    . TUBAL LIGATION  2003     OB History    Gravida  2   Para  2   Term  2   Preterm      AB      Living  2     SAB      IAB      Ectopic       Multiple      Live Births              Family History  Problem Relation Age of Onset  . Diabetes Mother   . Hypertension Mother   . Heart disease Mother   . Depression Mother   . Anxiety disorder Mother   . Obesity Mother   . Heart disease Other   . Thyroid disease Other   . Bone cancer Other   . High blood pressure Other   . Migraines Neg Hx     Social History   Tobacco Use  . Smoking status: Never Smoker  . Smokeless tobacco: Never Used  Vaping Use  . Vaping Use: Never used  Substance Use Topics  . Alcohol use: No  . Drug use: No    Home Medications Prior to Admission medications   Medication Sig Start Date End Date Taking? Authorizing Provider  albuterol (VENTOLIN HFA) 108 (90 Base) MCG/ACT inhaler Inhale 2 puffs into the lungs every 6 (six)  hours as needed for wheezing or shortness of breath.    [provider]  Apixaban Starter Pack, 10mg  and 5mg , (ELIQUIS DVT/PE STARTER PACK) Take 5-10 mg by mouth See admin instructions. Take 10 mg by mouth two times a day for 7 days, then decrease to 5 mg two times a day on the 8th day 02/15/20   [provider]  naproxen sodium (ALEVE) 220 MG tablet Take 220-440 mg by mouth 2 (two) times daily as needed (for pain or headaches).    [provider]  norethindrone (AYGESTIN) 5 MG tablet Take 10 mg by mouth daily.    [provider]    Allergies    Cephalexin and Moxifloxacin  Review of Systems   Review of Systems  All other systems reviewed and are negative.   Physical Exam Updated Vital Signs BP 119/83 (BP Location: Right Arm)   Pulse 81   Temp 98.1 F (36.7 C) (Oral)   Resp 20   Ht 1.702 m (5\' 7" )   Wt (!) 155.1 kg   SpO2 100%   BMI 53.56 kg/m   Physical Exam Vitals and nursing note reviewed.  Constitutional:      General: She is not in acute distress.    Appearance: She is well-developed.  HENT:     Head: Normocephalic and atraumatic.     Right Ear: External ear  normal.     Left Ear: External ear normal.  Eyes:     General: No scleral icterus.       Right eye: No discharge.        Left eye: No discharge.     Conjunctiva/sclera: Conjunctivae normal.  Neck:     Trachea: No tracheal deviation.  Cardiovascular:     Rate and Rhythm: Normal rate and regular rhythm.  Pulmonary:     Effort: Pulmonary effort is normal. No respiratory distress.     Breath sounds: Normal breath sounds. No stridor. No wheezing or rales.  Abdominal:     General: Bowel sounds are normal. There is no distension.     Palpations: Abdomen is soft.     Tenderness: There is abdominal tenderness in the epigastric area. There is guarding. There is no rebound.     Hernia: No hernia is present.  Musculoskeletal:        General: No tenderness.     Cervical back: Neck supple.  Skin:    General: Skin is warm and dry.     Findings: No rash.  Neurological:     Mental Status: She is alert.     Cranial Nerves: No cranial nerve deficit (no facial droop, extraocular movements intact, no slurred speech).     Sensory: No sensory deficit.     Motor: No abnormal muscle tone or seizure activity.     Coordination: Coordination normal.     ED Results / Procedures / Treatments   Labs (all labs ordered are listed, but only abnormal results are displayed) Labs Reviewed  COMPREHENSIVE METABOLIC PANEL  LIPASE, BLOOD  CBC WITH DIFFERENTIAL/PLATELET  URINALYSIS, ROUTINE W REFLEX MICROSCOPIC  PREGNANCY, URINE    EKG None  Radiology No results found.  Procedures Procedures   Medications Ordered in ED Medications  morphine 4 MG/ML injection 4 mg (has no administration in time range)  ondansetron (ZOFRAN) injection 4 mg (has no administration in time range)    ED Course  I have reviewed the triage vital signs and the nursing notes.  Pertinent labs & imaging results  that were available during my care of the patient were reviewed by me and considered in my medical decision making  (see chart for details).  Clinical Course as of 04/19/20 1733  Fri Apr 17, 2020  2159 Lipase is normal.  Metabolic panel is unremarkable.  CBC is normal. [JK]  2159 Patient symptoms are concerning for the possibility obstruction.  We will proceed with CT scan [JK]    Clinical Course User Index [JK] Dorie Rank, MD   MDM Rules/Calculators/A&P                          Pt with abd pain, vomiting.  Known history of larger uterine fibroid.  Scheduled for hysterectomy.  Acute onset concerning for possible obstruction.   Care turned over to Dr Karle Starch pending ct results   Dorie Rank, MD 04/19/20 1734

## 2020-04-17 NOTE — ED Notes (Signed)
  Patient BIB EMS for abdominal pain that has been going on for several days.  Patient has hx of a large uterine fibroid that causes bleeding.  Scheduled for surgery and takes estrogen for bleeding.  Patient states she has not had a BM in several days and feels nauseous, with no vomiting.  Patient was at work and had sharp epigastric pain that radiated down the R side of her belly.  Pain 8/10, sharp pain.

## 2020-04-18 LAB — URINALYSIS, ROUTINE W REFLEX MICROSCOPIC
Bilirubin Urine: NEGATIVE
Glucose, UA: NEGATIVE mg/dL
Ketones, ur: NEGATIVE mg/dL
Leukocytes,Ua: NEGATIVE
Nitrite: NEGATIVE
Protein, ur: 100 mg/dL — AB
Specific Gravity, Urine: >1.046 — ABNORMAL HIGH (ref 1.005–1.030)
pH: 6 (ref 5.0–8.0)

## 2020-04-18 LAB — PREGNANCY, URINE: Preg Test, Ur: NEGATIVE

## 2020-04-18 MED ORDER — ONDANSETRON 4 MG PO TBDP: 4.0000 mg | ORAL_TABLET | Freq: Three times a day (TID) | ORAL | 0 refills | Status: AC | PRN

## 2020-04-18 MED ORDER — AMOXICILLIN-POT CLAVULANATE 875-125 MG PO TABS: 1.0000 | ORAL_TABLET | Freq: Two times a day (BID) | ORAL | 0 refills | Status: DC

## 2020-06-11 ENCOUNTER — Other Ambulatory Visit (HOSPITAL_BASED_OUTPATIENT_CLINIC_OR_DEPARTMENT_OTHER): Payer: Self-pay | Admitting: Internal Medicine

## 2020-06-11 DIAGNOSIS — Z1231 Encounter for screening mammogram for malignant neoplasm of breast: Secondary | ICD-10-CM

## 2020-06-22 ENCOUNTER — Inpatient Hospital Stay (HOSPITAL_BASED_OUTPATIENT_CLINIC_OR_DEPARTMENT_OTHER): Admission: RE | Admit: 2020-06-22 | Payer: Medicaid Other | Source: Ambulatory Visit

## 2020-06-22 DIAGNOSIS — F5101 Primary insomnia: Secondary | ICD-10-CM | POA: Insufficient documentation

## 2020-09-14 ENCOUNTER — Inpatient Hospital Stay (HOSPITAL_BASED_OUTPATIENT_CLINIC_OR_DEPARTMENT_OTHER): Admission: RE | Admit: 2020-09-14 | Payer: Medicaid Other | Source: Ambulatory Visit

## 2020-11-16 DIAGNOSIS — G4730 Sleep apnea, unspecified: Secondary | ICD-10-CM | POA: Insufficient documentation

## 2020-11-16 DIAGNOSIS — G8929 Other chronic pain: Secondary | ICD-10-CM | POA: Insufficient documentation

## 2021-06-12 ENCOUNTER — Encounter (HOSPITAL_BASED_OUTPATIENT_CLINIC_OR_DEPARTMENT_OTHER): Payer: Self-pay | Admitting: Emergency Medicine

## 2021-06-12 ENCOUNTER — Other Ambulatory Visit: Payer: Self-pay

## 2021-06-12 ENCOUNTER — Emergency Department (HOSPITAL_BASED_OUTPATIENT_CLINIC_OR_DEPARTMENT_OTHER): Payer: Medicaid Other

## 2021-06-12 ENCOUNTER — Emergency Department (HOSPITAL_BASED_OUTPATIENT_CLINIC_OR_DEPARTMENT_OTHER)
Admission: EM | Admit: 2021-06-12 | Discharge: 2021-06-12 | Disposition: A | Discharge status: Home / Self Care | Payer: Medicaid Other | Attending: Emergency Medicine | Admitting: Emergency Medicine

## 2021-06-12 DIAGNOSIS — R0789 Other chest pain: Secondary | ICD-10-CM | POA: Insufficient documentation

## 2021-06-12 DIAGNOSIS — R0602 Shortness of breath: Secondary | ICD-10-CM | POA: Diagnosis not present

## 2021-06-12 DIAGNOSIS — Z7901 Long term (current) use of anticoagulants: Secondary | ICD-10-CM | POA: Insufficient documentation

## 2021-06-12 DIAGNOSIS — R079 Chest pain, unspecified: Secondary | ICD-10-CM

## 2021-06-12 HISTORY — DX: Other pulmonary embolism without acute cor pulmonale: I26.99

## 2021-06-12 LAB — CBC
HCT: 42.4 % (ref 36.0–46.0)
Hemoglobin: 13.1 g/dL (ref 12.0–15.0)
MCH: 28.4 pg (ref 26.0–34.0)
MCHC: 30.9 g/dL (ref 30.0–36.0)
MCV: 91.8 fL (ref 80.0–100.0)
Platelets: 271 10*3/uL (ref 150–400)
RBC: 4.62 MIL/uL (ref 3.87–5.11)
RDW: 13.6 % (ref 11.5–15.5)
WBC: 4.5 10*3/uL (ref 4.0–10.5)
nRBC: 0 % (ref 0.0–0.2)

## 2021-06-12 LAB — BASIC METABOLIC PANEL
Anion gap: 9 (ref 5–15)
BUN: 13 mg/dL (ref 6–20)
CO2: 30 mmol/L (ref 22–32)
Calcium: 9.5 mg/dL (ref 8.9–10.3)
Chloride: 100 mmol/L (ref 98–111)
Creatinine, Ser: 0.75 mg/dL (ref 0.44–1.00)
GFR, Estimated: >60 mL/min (ref >60)
Glucose, Bld: 134 mg/dL — ABNORMAL HIGH (ref 70–99)
Potassium: 3.7 mmol/L (ref 3.5–5.1)
Sodium: 139 mmol/L (ref 135–145)

## 2021-06-12 LAB — D-DIMER, QUANTITATIVE: D-Dimer, Quant: 0.58 ug/mL-FEU — ABNORMAL HIGH (ref 0.00–0.50)

## 2021-06-12 LAB — TROPONIN I (HIGH SENSITIVITY)
Troponin I (High Sensitivity): 2 ng/L (ref <18)
Troponin I (High Sensitivity): 2 ng/L (ref <18)

## 2021-06-12 MED ORDER — ASPIRIN 325 MG PO TABS
325.0000 mg | ORAL_TABLET | Freq: Every day | ORAL | Status: DC
  Administered 2021-06-12: 325 mg via ORAL
  Filled 2021-06-12: qty 1

## 2021-06-12 MED ORDER — NITROGLYCERIN 0.4 MG SL SUBL
0.4000 mg | SUBLINGUAL_TABLET | SUBLINGUAL | Status: DC | PRN
Start: 2021-06-12 — End: 2021-06-12
  Administered 2021-06-12: 0.4 mg via SUBLINGUAL
  Filled 2021-06-12: qty 1

## 2021-06-12 MED ORDER — IOHEXOL 350 MG/ML SOLN
100.0000 mL | Freq: Once | INTRAVENOUS | Status: AC | PRN
  Administered 2021-06-12: 83 mL via INTRAVENOUS

## 2021-06-12 NOTE — ED Triage Notes (Signed)
Pt had PE in last year in May, came off eliquis this may

## 2021-06-12 NOTE — Discharge Instructions (Addendum)
Please follow-up with cardiology and your PCP to discuss outpatient stress testing.  Return to the emergency department in the event of chest pain and shortness of breath that is persistent, profuse sweatiness, any other concerns.  Cardiac stress testing can restratify you for your risk for an adverse cardiac event in the next 30 days.  Your cardiac enzymes today were normal and your symptoms have resolved.

## 2021-06-12 NOTE — ED Notes (Signed)
Discharge instructions and follow up care reviewed and explained. Pt verbalized understanding and had no other questions at d/c. Pt caox4, ambulatory, and denying CP and SOB on departure.

## 2021-06-12 NOTE — ED Triage Notes (Signed)
Chest pain ,shortness of breath this am. No smoker, does have htn, high cholesterol, pre diabetic.

## 2021-06-12 NOTE — ED Provider Notes (Signed)
[facility] Provider Note   [name]: [id number] Arrival date & time: 06/12/21  0932     History  Chief Complaint  Patient presents with   Chest Pain    Jaime Castaneda is a 51 y.o. female.   Chest Pain   51 year old female with medical history significant for obesity, anxiety, GERD, depression, PE no longer on anticoagulation who presents to the emergency department with chest pain and shortness of breath.  The history is provided by the patient who states that she has had roughly 1 day of chest tightness and shortness of breath that started this morning.  She states that symptoms do not feel similar to when she had her PE.  She has had persistent chest tightness and almost heaviness on her chest that was concerning to her.  She has never had a cardiac ischemic evaluation previously.  She is not a smoker.  She denies any cough, fever, chills.  Home Medications Prior to Admission medications   Medication Sig Start Date End Date Taking? Authorizing Provider  albuterol (VENTOLIN HFA) 108 (90 Base) MCG/ACT inhaler Inhale 2 puffs into the lungs every 6 (six) hours as needed for wheezing or shortness of breath.    [provider]  amoxicillin-clavulanate (AUGMENTIN) 875-125 MG tablet Take 1 tablet by mouth every 12 (twelve) hours. 04/18/20   Truddie Hidden, MD  Apixaban Starter Pack, '10mg'$  and '5mg'$ , (ELIQUIS DVT/PE STARTER PACK) Take 5-10 mg by mouth See admin instructions. Take 10 mg by mouth two times a day for 7 days, then decrease to 5 mg two times a day on the 8th day 02/15/20   [provider]  naproxen sodium (ALEVE) 220 MG tablet Take 220-440 mg by mouth 2 (two) times daily as needed (for pain or headaches).    [provider]  norethindrone (AYGESTIN) 5 MG tablet Take 10 mg by mouth daily.    [provider]  ondansetron (ZOFRAN ODT) 4 MG disintegrating tablet Take 1 tablet (4 mg total) by mouth every 8 (eight) hours as  needed for nausea or vomiting. 04/18/20   Truddie Hidden, MD      Allergies    Cephalexin and Moxifloxacin    Review of Systems   Review of Systems  Cardiovascular:  Positive for chest pain.    Physical Exam Updated Vital Signs BP (!) 148/77   Pulse 65   Temp 98.1 F (36.7 C) (Oral)   Resp 16   Wt (!) 149.7 kg   SpO2 98%   BMI 51.69 kg/m  Physical Exam Vitals and nursing note reviewed.  Constitutional:      General: She is not in acute distress.    Appearance: She is well-developed.  HENT:     Head: Normocephalic and atraumatic.  Eyes:     Conjunctiva/sclera: Conjunctivae normal.  Cardiovascular:     Rate and Rhythm: Normal rate and regular rhythm.     Heart sounds: No murmur heard. Pulmonary:     Effort: Pulmonary effort is normal. No respiratory distress.     Breath sounds: Normal breath sounds.  Chest:     Comments: Reproducible left-sided chest wall tenderness Abdominal:     Palpations: Abdomen is soft.     Tenderness: There is no abdominal tenderness.  Musculoskeletal:        General: No swelling.     Cervical back: Neck supple.  Skin:    General: Skin is warm and dry.     Capillary Refill: Capillary  refill takes less than 2 seconds.  Neurological:     Mental Status: She is alert.  Psychiatric:        Mood and Affect: Mood normal.     ED Results / Procedures / Treatments   Labs (all labs ordered are listed, but only abnormal results are displayed) Labs Reviewed  BASIC METABOLIC PANEL - Abnormal; Notable for the following components:      Result Value   Glucose, Bld 134 (*)    All other components within normal limits  D-DIMER, QUANTITATIVE - Abnormal; Notable for the following components:   D-Dimer, Quant 0.58 (*)    All other components within normal limits  CBC  TROPONIN I (HIGH SENSITIVITY)  TROPONIN I (HIGH SENSITIVITY)    EKG EKG Interpretation  Date/Time:  Saturday June 12 2021 07:55:50 EDT Ventricular Rate:  75 PR  Interval:  184 QRS Duration: 108 QT Interval:  382 QTC Calculation: 427 R Axis:   -25 Text Interpretation: Sinus rhythm Prominent P waves, nondiagnostic Low voltage, precordial leads RSR' in V1 or V2, right VCD or RVH Left ventricular hypertrophy Anterior Q waves, possibly due to LVH Borderline T abnormalities, inferior leads Confirmed by Regan Lemming ([id number]) on 06/12/2021 8:00:31 AM Also confirmed by Regan Lemming ([address], Jeannetta Nap [phone])  on 06/12/2021 9:49:57 AM  Radiology CT Angio Chest PE W and/or Wo Contrast  Result Date: 06/12/2021 CLINICAL DATA:  Shortness of breath for 3 days, positive D-dimer, concern for pulmonary embolus EXAM: CT ANGIOGRAPHY CHEST WITH CONTRAST TECHNIQUE: Multidetector CT imaging of the chest was performed using the standard protocol during bolus administration of intravenous contrast. Multiplanar CT image reconstructions and MIPs were obtained to evaluate the vascular anatomy. RADIATION DOSE REDUCTION: This exam was performed according to the departmental dose-optimization program which includes automated exposure control, adjustment of the mA and/or kV according to patient size and/or use of iterative reconstruction technique. CONTRAST:  75m OMNIPAQUE IOHEXOL 350 MG/ML SOLN COMPARISON:  05/11/2020, 06/12/2021 FINDINGS: Cardiovascular: Satisfactory opacification of the pulmonary arteries to the segmental level. No evidence of pulmonary embolism. Normal heart size. No pericardial effusion. Mediastinum/Nodes: No enlarged mediastinal, hilar, or axillary lymph nodes. Thyroid gland, trachea, and esophagus demonstrate no significant findings. Lungs/Pleura: Lungs are clear. No pleural effusion or pneumothorax. Upper Abdomen: No acute abnormality. Musculoskeletal: Mild diffuse thoracic spondylosis. No acute osseous finding. Intact sternum. Review of the MIP images confirms the above findings. IMPRESSION: Negative for significant acute pulmonary embolus by CTA. No other  acute intrathoracic finding. Electronically Signed   By: MJerilynn Mages  Shick M.D.   On: 06/12/2021 10:03   DG Chest Port 1 View  Result Date: 06/12/2021 CLINICAL DATA:  Chest pain and shortness of breath. EXAM: PORTABLE CHEST 1 VIEW COMPARISON:  04/02/2020 FINDINGS: 0806 hours. Low volume film. The lungs are clear without focal pneumonia, edema, pneumothorax or pleural effusion. Cardiopericardial silhouette is at upper limits of normal for size. The visualized bony structures of the thorax are unremarkable. Telemetry leads overlie the chest. IMPRESSION: Low volume film without acute cardiopulmonary findings. Electronically Signed   By: EMisty StanleyM.D.   On: 06/12/2021 09:05    Procedures Procedures    Medications Ordered in ED Medications  iohexol (OMNIPAQUE) 350 MG/ML injection 100 mL (83 mLs Intravenous Contrast Given 06/12/21 06063    ED Course/ Medical Decision Making/ A&P                           Medical Decision  Making Amount and/or Complexity of Data Reviewed Labs: ordered. Radiology: ordered.  Risk OTC drugs. Prescription drug management.   51 year old female with medical history significant for obesity, anxiety, GERD, depression, PE no longer on anticoagulation who presents to the emergency department with chest pain and shortness of breath.  The history is provided by the patient who states that she has had roughly 1 day of chest tightness and shortness of breath that started this morning.  She states that symptoms do not feel similar to when she had her PE.  She has had persistent chest tightness and almost heaviness on her chest that was concerning to her.  She has never had a cardiac ischemic evaluation previously.  She is not a smoker.  She denies any cough, fever, chills.  Vitals and telemetry on arrival: Afebrile, hemodynamically stable, not tachycardic or tachypneic, saturating well on room air, sinus rhythm noted on cardiac telemetry  Pertinent exam findings include:  Generally unremarkable, no JVD, no lower extremity edema, lungs clear to auscultation bilaterally  Differential diagnosis includes: Anxiety, PE, musculoskeletal chest discomfort felt most likely.  ACS, pneumonia, pneumothorax, pulmonary embolism,pericarditis/myocarditis, GERD, PUD, musculoskeletal. Patient given ASA 325 mg, given nitroglycerin.  EKG: Normal sinus rhythm with a rate of 75 and no evidence of acute ischemic changes, abnormal intervals, or dysrhythmia. Nonspecific t wave changes were present  Lab interpreted by myself results include: Troponins x2 negative, D-dimer positive at 0.58 after adjusting for age, BMP unremarkable, CBC unremarkable, no leukocytosis or anemia   Imaging results include: Chest x-ray interpreted by myself and radiology negative for acute findings.  Given elevated D-dimer, CTA PE study was performed and results include the following: IMPRESSION:  Negative for significant acute pulmonary embolus by CTA.    No other acute intrathoracic finding.    Well's score, low probability. D-dimer positive. Labs unremarkable.  CTA without PE, with no evidence for pneumonia or pneumothorax.Margretta Ditty pericarditis/myocarditis, does not fit clinical picture. Unlikely dissection, no pulse deficit, no tearing chest pain, no neurologic complaints. Due to patient's high risk ACS ruled out with negative troponins x2.   Course of tx has consisted of:ASA and Nitroglycerin   The patient's pain had resolved on repeat assessment.  She is feeling symptomatically improved.  She does have reproducible left-sided chest wall tenderness to palpation however her symptoms of chest pressure and tightness with mild shortness of breath, some improvement with nitroglycerin are concerning. She has no evidence of shingles. I discussed admission for stress testing versus discharge home.  At this time, the patient is comfortable with the plan for continued outpatient management and I think that this is  reasonable.  Recommended the patient follow-up with cardiology and her PCP to discuss outpatient stress testing.  Discussed extensive and strict return precautions in the event of return of symptoms or and/or worsening symptoms.  Patient's clinical presentation is most consistent with atypical chest pain, chest wall pain.   Final Clinical Impression(s) / ED Diagnoses Final diagnoses:  Chest pain, unspecified type  Chest wall pain    Rx / DC Orders ED Discharge Orders          Ordered    Ambulatory referral to Cardiology       Comments: If you have not heard from the Cardiology office within the next 72 hours please call [phone].   06/12/21 1042              Regan Lemming, MD 06/13/21 [phone]

## 2021-06-22 ENCOUNTER — Ambulatory Visit: Payer: Medicaid Other | Admitting: Internal Medicine

## 2021-07-07 ENCOUNTER — Encounter: Payer: Self-pay | Admitting: *Deleted

## 2021-08-11 ENCOUNTER — Encounter (INDEPENDENT_AMBULATORY_CARE_PROVIDER_SITE_OTHER): Payer: Self-pay

## 2021-09-10 ENCOUNTER — Emergency Department (HOSPITAL_BASED_OUTPATIENT_CLINIC_OR_DEPARTMENT_OTHER): Payer: Medicaid Other | Admitting: Radiology

## 2021-09-10 ENCOUNTER — Emergency Department (HOSPITAL_BASED_OUTPATIENT_CLINIC_OR_DEPARTMENT_OTHER): Payer: Medicaid Other

## 2021-09-10 ENCOUNTER — Emergency Department (HOSPITAL_BASED_OUTPATIENT_CLINIC_OR_DEPARTMENT_OTHER)
Admission: EM | Admit: 2021-09-10 | Discharge: 2021-09-10 | Disposition: A | Discharge status: Home / Self Care | Payer: Medicaid Other | Attending: Emergency Medicine | Admitting: Emergency Medicine

## 2021-09-10 ENCOUNTER — Other Ambulatory Visit: Payer: Self-pay

## 2021-09-10 ENCOUNTER — Encounter (HOSPITAL_BASED_OUTPATIENT_CLINIC_OR_DEPARTMENT_OTHER): Payer: Self-pay | Admitting: Emergency Medicine

## 2021-09-10 DIAGNOSIS — R079 Chest pain, unspecified: Secondary | ICD-10-CM | POA: Diagnosis present

## 2021-09-10 DIAGNOSIS — R600 Localized edema: Secondary | ICD-10-CM | POA: Insufficient documentation

## 2021-09-10 DIAGNOSIS — R0602 Shortness of breath: Secondary | ICD-10-CM | POA: Insufficient documentation

## 2021-09-10 LAB — BASIC METABOLIC PANEL
Anion gap: 10 (ref 5–15)
BUN: 9 mg/dL (ref 6–20)
CO2: 29 mmol/L (ref 22–32)
Calcium: 9.2 mg/dL (ref 8.9–10.3)
Chloride: 101 mmol/L (ref 98–111)
Creatinine, Ser: 0.59 mg/dL (ref 0.44–1.00)
GFR, Estimated: >60 mL/min (ref >60)
Glucose, Bld: 91 mg/dL (ref 70–99)
Potassium: 3.6 mmol/L (ref 3.5–5.1)
Sodium: 140 mmol/L (ref 135–145)

## 2021-09-10 LAB — CBC
HCT: 38.9 % (ref 36.0–46.0)
Hemoglobin: 12.6 g/dL (ref 12.0–15.0)
MCH: 28.8 pg (ref 26.0–34.0)
MCHC: 32.4 g/dL (ref 30.0–36.0)
MCV: 89 fL (ref 80.0–100.0)
Platelets: 268 10*3/uL (ref 150–400)
RBC: 4.37 MIL/uL (ref 3.87–5.11)
RDW: 13.4 % (ref 11.5–15.5)
WBC: 5 10*3/uL (ref 4.0–10.5)
nRBC: 0 % (ref 0.0–0.2)

## 2021-09-10 LAB — BRAIN NATRIURETIC PEPTIDE: B Natriuretic Peptide: 13.8 pg/mL (ref 0.0–100.0)

## 2021-09-10 LAB — TROPONIN I (HIGH SENSITIVITY)
Troponin I (High Sensitivity): <2 ng/L (ref <18)
Troponin I (High Sensitivity): 2 ng/L (ref <18)

## 2021-09-10 LAB — CK: Total CK: 168 U/L (ref 38–234)

## 2021-09-10 LAB — HCG, SERUM, QUALITATIVE: Preg, Serum: NEGATIVE

## 2021-09-10 MED ORDER — IOHEXOL 300 MG/ML  SOLN: 100.0000 mL | Freq: Once | INTRAMUSCULAR | Status: DC | PRN

## 2021-09-10 MED ORDER — IOHEXOL 350 MG/ML SOLN
100.0000 mL | Freq: Once | INTRAVENOUS | Status: AC | PRN
  Administered 2021-09-10: 100 mL via INTRAVENOUS

## 2021-09-10 NOTE — ED Notes (Signed)
Pt to CT

## 2021-09-10 NOTE — ED Provider Notes (Signed)
  Physical Exam  BP (!) 149/82 (BP Location: Right Arm)   Pulse 72   Temp 97.9 F (36.6 C) (Oral)   Resp 16   Ht '5\' 6"'$  (1.676 m)   Wt (!) 158.8 kg   SpO2 99%   BMI 56.49 kg/m   Physical Exam  Procedures  Procedures  ED Course / MDM    Medical Decision Making Amount and/or Complexity of Data Reviewed Labs: ordered. Radiology: ordered.  Risk Prescription drug management.   Received patient in signout.  Work-up reassuring.  Negative troponins.  Will discharge.       Davonna Belling, MD 09/10/21 [phone]

## 2021-09-10 NOTE — Discharge Instructions (Signed)
You were seen in the emergency department for evaluation of chest pain shortness of breath and swelling in your legs.  You had an ultrasound of your legs, CAT scan of your chest, lab work EKG that did not show an obvious explanation for your symptoms.  Please continue your regular medications and follow-up with your primary care doctor.  Return to the emergency department if any worsening or concerning symptoms.

## 2021-09-10 NOTE — ED Triage Notes (Signed)
Chest pain since yesterday and retaining fluid in lower legs with dizziness and sob with exertion  all week and today got worse

## 2021-09-10 NOTE — ED Provider Notes (Signed)
[facility] Provider Note   [name]: [id number] Arrival date & time: 09/10/21  1300     History  Chief Complaint  Patient presents with   Chest Pain    Jaime Castaneda is a 51 y.o. female.  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  Not currently on anticoagulation.  She is complaining of 1 week of generalized fatigue.  She has noticed some swelling in her legs bilateral.  Since yesterday she has had some left-sided pressure in her chest radiating to her left shoulder associated with some shortness of breath.  She has tried an over-the-counter diuretic with some diuresis but no significant improvement.  No fevers chills cough nausea or vomiting.  No known cardiac disease non-smoker.  The history is provided by the patient.  Chest Pain Pain location:  Substernal area and L chest Pain quality: pressure   Pain radiates to:  L shoulder Onset quality:  Gradual Duration:  2 days Timing:  Constant Progression:  Unchanged Chronicity:  New Relieved by:  Nothing Worsened by:  Nothing Ineffective treatments:  None tried Associated symptoms: dizziness, fatigue, lower extremity edema and shortness of breath   Associated symptoms: no abdominal pain, no cough, no diaphoresis and no fever   Risk factors: prior DVT/PE   Risk factors: no coronary artery disease and no smoking        Home Medications Prior to Admission medications   Medication Sig Start Date End Date Taking? Authorizing Provider  albuterol (VENTOLIN HFA) 108 (90 Base) MCG/ACT inhaler Inhale 2 puffs into the lungs every 6 (six) hours as needed for wheezing or shortness of breath.    [provider]  amoxicillin-clavulanate (AUGMENTIN) 875-125 MG tablet Take 1 tablet by mouth every 12 (twelve) hours. 04/18/20   Truddie Hidden, MD  Apixaban Starter Pack, '10mg'$  and '5mg'$ , (ELIQUIS DVT/PE STARTER PACK) Take 5-10 mg by mouth See admin instructions. Take 10 mg by mouth two  times a day for 7 days, then decrease to 5 mg two times a day on the 8th day 02/15/20   [provider]  naproxen sodium (ALEVE) 220 MG tablet Take 220-440 mg by mouth 2 (two) times daily as needed (for pain or headaches).    [provider]  norethindrone (AYGESTIN) 5 MG tablet Take 10 mg by mouth daily.    [provider]  ondansetron (ZOFRAN ODT) 4 MG disintegrating tablet Take 1 tablet (4 mg total) by mouth every 8 (eight) hours as needed for nausea or vomiting. 04/18/20   Truddie Hidden, MD      Allergies    Cephalexin and Moxifloxacin    Review of Systems   Review of Systems  Constitutional:  Positive for fatigue. Negative for diaphoresis and fever.  Respiratory:  Positive for shortness of breath. Negative for cough.   Cardiovascular:  Positive for chest pain and leg swelling.  Gastrointestinal:  Negative for abdominal pain.  Genitourinary:  Negative for dysuria.  Skin:  Negative for rash.  Neurological:  Positive for dizziness. Negative for speech difficulty.    Physical Exam Updated Vital Signs BP (!) 149/87   Pulse 78   Temp (!) 97.4 F (36.3 C)   Resp (!) 25   Ht '5\' 6"'$  (1.676 m)   Wt (!) 158.8 kg   SpO2 95%   BMI 56.49 kg/m  Physical Exam Vitals and nursing note reviewed.  Constitutional:      General: She is not  in acute distress.    Appearance: Normal appearance. She is well-developed. She is obese.  HENT:     Head: Normocephalic and atraumatic.  Eyes:     Conjunctiva/sclera: Conjunctivae normal.  Cardiovascular:     Rate and Rhythm: Normal rate and regular rhythm.     Heart sounds: Normal heart sounds. No murmur heard. Pulmonary:     Effort: Pulmonary effort is normal. No respiratory distress.     Breath sounds: Normal breath sounds.  Abdominal:     Palpations: Abdomen is soft.     Tenderness: There is no abdominal tenderness. There is no guarding or rebound.  Musculoskeletal:        General: No swelling. Normal range of  motion.     Cervical back: Neck supple.     Right lower leg: No tenderness. Edema present.     Left lower leg: No tenderness. Edema present.  Skin:    General: Skin is warm and dry.     Capillary Refill: Capillary refill takes less than 2 seconds.  Neurological:     General: No focal deficit present.     Mental Status: She is alert.     Sensory: No sensory deficit.     Motor: No weakness.     ED Results / Procedures / Treatments   Labs (all labs ordered are listed, but only abnormal results are displayed) Labs Reviewed  BASIC METABOLIC PANEL  CBC  HCG, SERUM, QUALITATIVE  BRAIN NATRIURETIC PEPTIDE  CK  TROPONIN I (HIGH SENSITIVITY)  TROPONIN I (HIGH SENSITIVITY)    EKG EKG Interpretation  Date/Time:  Friday September 10 2021 13:14:25 EDT Ventricular Rate:  80 PR Interval:  170 QRS Duration: 100 QT Interval:  396 QTC Calculation: 456 R Axis:   -10 Text Interpretation: Normal sinus rhythm Cannot rule out Anterior infarct , age undetermined Abnormal ECG When compared with ECG of 12-Jun-2021 07:55, No significant change since last tracing Confirmed by Aletta Edouard [phone]) on 09/10/2021 1:41:28 PM  Radiology CT Angio Chest PE W/Cm &/Or Wo Cm  Result Date: 09/10/2021 CLINICAL DATA:  Chest pain. EXAM: CT ANGIOGRAPHY CHEST WITH CONTRAST TECHNIQUE: Multidetector CT imaging of the chest was performed using the standard protocol during bolus administration of intravenous contrast. Multiplanar CT image reconstructions and MIPs were obtained to evaluate the vascular anatomy. RADIATION DOSE REDUCTION: This exam was performed according to the departmental dose-optimization program which includes automated exposure control, adjustment of the mA and/or kV according to patient size and/or use of iterative reconstruction technique. CONTRAST:  129m OMNIPAQUE IOHEXOL 350 MG/ML SOLN COMPARISON:  June 12, 2021. FINDINGS: Cardiovascular: Satisfactory opacification of the pulmonary arteries to the  segmental level. No evidence of pulmonary embolism. Normal heart size. No pericardial effusion. Mediastinum/Nodes: No enlarged mediastinal, hilar, or axillary lymph nodes. Thyroid gland, trachea, and esophagus demonstrate no significant findings. Lungs/Pleura: Lungs are clear. No pleural effusion or pneumothorax. Upper Abdomen: No acute abnormality. Musculoskeletal: No chest wall abnormality. No acute or significant osseous findings. Review of the MIP images confirms the above findings. IMPRESSION: No definite evidence of pulmonary embolus. No definite acute abnormality seen in the chest. Electronically Signed   By: JMarijo ConceptionM.D.   On: 09/10/2021 15:32   [place]Venous Img Lower Bilateral (DVT)  Result Date: 09/10/2021 CLINICAL DATA:  Leg swelling EXAM: BILATERAL LOWER EXTREMITY VENOUS DOPPLER ULTRASOUND TECHNIQUE: Gray-scale sonography with graded compression, as well as color Doppler and duplex ultrasound were performed to evaluate the lower extremity deep venous systems from  the level of the common femoral vein and including the common femoral, femoral, profunda femoral, popliteal and calf veins including the posterior tibial, peroneal and gastrocnemius veins when visible. The superficial great saphenous vein was also interrogated. Spectral Doppler was utilized to evaluate flow at rest and with distal augmentation maneuvers in the common femoral, femoral and popliteal veins. COMPARISON:  Chest XR, concurrent.  CTA PE, 06/12/2021. FINDINGS: Suboptimal evaluation, with poor acoustic penetration secondary to patient habitus. RIGHT LOWER EXTREMITY VENOUS Normal compressibility of the RIGHT common femoral, superficial femoral, and popliteal veins, as well as the visualized calf veins. Visualized portions of profunda femoral vein and great saphenous vein unremarkable. No filling defects to suggest DVT on grayscale or color Doppler imaging. Doppler waveforms show normal direction of venous flow, normal respiratory  plasticity and response to augmentation. OTHER No evidence of superficial thrombophlebitis or abnormal fluid collection. Limitations: Patient body habitus LEFT LOWER EXTREMITY VENOUS Normal compressibility of the LEFT common femoral, superficial femoral, and popliteal veins, as well as the visualized calf veins. Visualized portions of profunda femoral vein and great saphenous vein unremarkable. No filling defects to suggest DVT on grayscale or color Doppler imaging. Doppler waveforms show normal direction of venous flow, normal respiratory plasticity and response to augmentation. OTHER No evidence of superficial thrombophlebitis or abnormal fluid collection. Limitations: Patient body habitus IMPRESSION: Suboptimal evaluation, within these constraints; No evidence of femoropopliteal DVT or superficial thrombophlebitis within either lower extremity. Michaelle Birks, MD [facility] Radiology Electronically Signed   By: Michaelle Birks M.D.   On: 09/10/2021 15:25   DG Chest 2 View  Result Date: 09/10/2021 CLINICAL DATA:  Chest pain since yesterday with bilateral lower extremity edema. Shortness of breath. EXAM: CHEST - 2 VIEW COMPARISON:  06/12/2021 FINDINGS: Lungs are hypoinflated without focal airspace consolidation or effusion. Cardiomediastinal silhouette and remainder of the exam is unchanged. IMPRESSION: Hypoinflation without acute cardiopulmonary disease. Electronically Signed   By: Marin Olp M.D.   On: 09/10/2021 14:08    Procedures Procedures    Medications Ordered in ED Medications  iohexol (OMNIPAQUE) 300 MG/ML solution 100 mL ( Intravenous Canceled Entry 09/10/21 1517)  iohexol (OMNIPAQUE) 350 MG/ML injection 100 mL (100 mLs Intravenous Contrast Given 09/10/21 1516)    ED Course/ Medical Decision Making/ A&P                           Medical Decision Making Amount and/or Complexity of Data Reviewed Labs: ordered. Radiology:  ordered.  Risk Prescription drug management.   This patient complains of chest pain shortness of breath peripheral edema; this involves an extensive number of treatment Options and is a complaint that carries with it a high risk of complications and morbidity. The differential includes pneumonia, ACS, PE, pneumothorax, vascular, reflux, CHF  I ordered, reviewed and interpreted labs, which included CBC normal chemistries normal troponins flat CK normal pregnancy negative BNP normal I ordered imaging studies which included chest x-ray, duplex, CT angio chest and I independently    visualized and interpreted imaging which showed no acute findings.  CT angio chest pending at time of signout Previous records obtained and reviewed in epic, similar work-up 2 months ago with no findings Cardiac monitoring reviewed, normal sinus rhythm Social determinants considered, no significant barriers Critical Interventions: None  After the interventions stated above, I reevaluated the patient and found patient to be oxygenating well with stable vitals on room air. Admission and further testing  considered, her care is signed to Dr. Alvino Chapel to follow-up on delta troponin and CT angio chest.  If negative likely can be discharged and outpatient follow-up with PCP.         Final Clinical Impression(s) / ED Diagnoses Final diagnoses:  Nonspecific chest pain  Shortness of breath  Bilateral lower extremity edema    Rx / DC Orders ED Discharge Orders     None         Hayden Rasmussen, MD 09/10/21 1706

## 2021-10-08 DIAGNOSIS — M170 Bilateral primary osteoarthritis of knee: Secondary | ICD-10-CM | POA: Insufficient documentation

## 2023-01-26 ENCOUNTER — Ambulatory Visit (INDEPENDENT_AMBULATORY_CARE_PROVIDER_SITE_OTHER): Payer: Medicaid Other

## 2023-01-26 ENCOUNTER — Ambulatory Visit
Admission: EM | Admit: 2023-01-26 | Discharge: 2023-01-26 | Disposition: A | Discharge status: Home / Self Care | Payer: Medicaid Other

## 2023-01-26 DIAGNOSIS — R051 Acute cough: Secondary | ICD-10-CM | POA: Diagnosis not present

## 2023-01-26 DIAGNOSIS — E113293 Type 2 diabetes mellitus with mild nonproliferative diabetic retinopathy without macular edema, bilateral: Secondary | ICD-10-CM

## 2023-01-26 DIAGNOSIS — M26609 Unspecified temporomandibular joint disorder, unspecified side: Secondary | ICD-10-CM | POA: Insufficient documentation

## 2023-01-26 DIAGNOSIS — R42 Dizziness and giddiness: Secondary | ICD-10-CM | POA: Insufficient documentation

## 2023-01-26 DIAGNOSIS — J069 Acute upper respiratory infection, unspecified: Secondary | ICD-10-CM

## 2023-01-26 DIAGNOSIS — R0609 Other forms of dyspnea: Secondary | ICD-10-CM | POA: Insufficient documentation

## 2023-01-26 DIAGNOSIS — Z8659 Personal history of other mental and behavioral disorders: Secondary | ICD-10-CM | POA: Insufficient documentation

## 2023-01-26 LAB — POCT FASTING CBG KUC MANUAL ENTRY: POCT Glucose (KUC): 100 mg/dL — AB (ref 70–99)

## 2023-01-26 LAB — POC COVID19/FLU A&B COMBO
Covid Antigen, POC: NEGATIVE
Influenza A Antigen, POC: NEGATIVE
Influenza B Antigen, POC: NEGATIVE

## 2023-01-26 MED ORDER — AMOXICILLIN-POT CLAVULANATE 875-125 MG PO TABS: 1.0000 | ORAL_TABLET | Freq: Two times a day (BID) | ORAL | 0 refills | Status: DC

## 2023-01-26 MED ORDER — MUPIROCIN CALCIUM 2 % EX CREA: 1.0000 | TOPICAL_CREAM | Freq: Two times a day (BID) | CUTANEOUS | 0 refills | Status: DC

## 2023-01-26 NOTE — ED Triage Notes (Signed)
"  This started on Saturday with a tickle in my throat, sweating, cough, sob and now this cough is hurting a lot". 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 "I have been dizzy as well for the last 2 days". Some Fever recently (degree unknown).

## 2023-01-26 NOTE — ED Notes (Signed)
Provider unable to access a computer creating unreasonable inconvenience to the ordering provider with possible delay in patient care. Acknowledged by [employer]. Repeated Verbal order by Brian-Provider.

## 2023-01-31 NOTE — ED Provider Notes (Addendum)
[name]    [name]: [id number] Arrival date & time: 01/26/23  1321      History   Chief Complaint Chief Complaint  Patient presents with   Cough   Dizziness    HPI Jaime Castaneda is a 53 y.o. female.   Patient here today for evaluation of sweating, cough, cough with mild shortness of breath.  She reports that symptoms started a few days ago.  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  She reports she thinks she has had fever but is unsure of temperature.  She has not any vomiting or diarrhea.  Patient reports occasional dizziness. Is a known diabetic.   She reports that she will get lesions in her nose with illness at times- requests bactroban for same as this has helped in the past.   The history is provided by the patient.  Cough Associated symptoms: chills, fever and sore throat   Associated symptoms: no ear pain, no eye discharge, no shortness of breath and no wheezing   Dizziness Associated symptoms: no diarrhea, no nausea, no shortness of breath and no vomiting     Past Medical History:  Diagnosis Date   Anemia    Anxiety    Arthritis of knee    bilateral   Asthma    Back pain    CFS (chronic fatigue syndrome)    Constipation    Depression    Dyspnea    Fibroid    GERD (gastroesophageal reflux disease)    Joint pain    Lower extremity edema    Metabolic syndrome    Migraine    Obesity    Prediabetes    Pulmonary embolus (HCC)    Scleritis    TMJ arthralgia    Vitamin D deficiency     Patient Active Problem List   Diagnosis Date Noted   Dizzy spells 01/26/2023   History of panic attacks 01/26/2023   TMJ (temporomandibular joint disorder) 01/26/2023   DOE (dyspnea on exertion) 01/26/2023   Primary osteoarthritis of both knees 10/08/2021   Chronic bilateral low back pain with bilateral sciatica 11/16/2020   Sleep-disordered breathing 11/16/2020   Primary insomnia 06/22/2020   Bleeding  03/24/2020   Gastroesophageal reflux disease without esophagitis 03/16/2020   Primary hypertension 03/16/2020   Snoring 03/16/2020   Vitamin B12 deficiency 03/16/2020   History of pulmonary embolism 03/02/2020   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 without macular edema, without long-term current use of insulin (HCC) 03/02/2020   Menorrhagia 02/17/2020   Pulmonary emboli (HCC) 02/17/2020   Other chest pain    Shortness of breath    Vaginal bleeding    Hypertensive retinopathy of both eyes 09/26/2017   Nuclear sclerotic cataract of both eyes 09/26/2017   Autoimmune deficiency syndrome (HCC) 09/16/2016   H/O Bell's palsy 09/16/2016   Scleritis 09/16/2016   Morbid obesity (HCC) 06/27/2015   Severe headache 06/27/2015   Vision changes 06/27/2015   Metabolic syndrome 06/27/2015   Migraine 06/27/2015   Mild intermittent asthma, uncomplicated 04/13/2015   Adjustment disorder with anxiety 02/14/2014   BV (bacterial vaginosis) 02/14/2014   Iron deficiency anemia 10/23/2012   Adjustment insomnia 09/26/2012   Candidal vulvovaginitis 06/09/2012   Elevated blood pressure reading 10/18/2010    Past Surgical History:  Procedure Laterality Date   ABLATION     uterine   SHOULDER SURGERY  TUBAL LIGATION  2003    OB History     Gravida  2   Para  2   Term  2   Preterm      AB      Living  2      SAB      IAB      Ectopic      Multiple      Live Births               Home Medications    Prior to Admission medications   Medication Sig Start Date End Date Taking? Authorizing Provider  amoxicillin-clavulanate (AUGMENTIN) 875-125 MG tablet Take 1 tablet by mouth every 12 (twelve) hours. 01/26/23  Yes Tomi Bamberger, PA-C  clobetasol (TEMOVATE) 0.05 % external solution Apply 1 Application topically 2 (two) times daily. 12/21/21  Yes [provider]  DULoxetine (CYMBALTA) 30 MG capsule Take 1 capsule by  mouth daily. 08/31/22  Yes [provider]  fluticasone (FLONASE) 50 MCG/ACT nasal spray Place 1 spray into both nostrils 2 (two) times daily. 06/22/20  Yes [provider]  glipiZIDE (GLUCOTROL XL) 5 MG 24 hr tablet Take 1 tablet by mouth daily. 12/08/22  Yes [provider]  ibuprofen (ADVIL) 200 MG tablet Take 200 mg by mouth every 6 (six) hours as needed. 02/14/20  Yes [provider]  losartan-hydrochlorothiazide (HYZAAR) 50-12.5 MG tablet Take 1 tablet by mouth daily. 09/24/21  Yes [provider]  mupirocin cream (BACTROBAN) 2 % Apply 1 Application topically 2 (two) times daily. 01/26/23  Yes Tomi Bamberger, PA-C  naproxen (NAPROSYN) 500 MG tablet Take 500 mg by mouth 2 (two) times daily with a meal. 07/16/21  Yes [provider]  Semaglutide,0.25 or 0.5MG /DOS, (OZEMPIC, 0.25 OR 0.5 MG/DOSE,) 2 MG/3ML SOPN  09/16/22  Yes [provider]  albuterol (VENTOLIN HFA) 108 (90 Base) MCG/ACT inhaler Inhale 2 puffs into the lungs every 6 (six) hours as needed for wheezing or shortness of breath.    [provider]  hydrOXYzine (ATARAX) 25 MG tablet Take 25 mg by mouth every 8 (eight) hours as needed for anxiety. 04/26/21   [provider]  norethindrone (AYGESTIN) 5 MG tablet Take 10 mg by mouth daily.    [provider]  ondansetron (ZOFRAN ODT) 4 MG disintegrating tablet Take 1 tablet (4 mg total) by mouth every 8 (eight) hours as needed for nausea or vomiting. 04/18/20   Pollyann Savoy, MD  triamcinolone acetonide (KENALOG-40) 40 MG/ML injection Inject 40 mg into the muscle once. 05/04/21   [provider]    Family History Family History  Problem Relation Age of Onset   Diabetes Mother    Hypertension Mother    Heart disease Mother    Depression Mother    Anxiety disorder Mother    Obesity Mother    Heart disease Other    Thyroid disease Other    Bone cancer Other    High blood pressure Other     Migraines Neg Hx     Social History Social History   Tobacco Use   Smoking status: Never   Smokeless tobacco: Never  Vaping Use   Vaping status: Never Used  Substance Use Topics   Alcohol use: No   Drug use: No     Allergies   Ferric carboxymaltose, Cephalexin, and Moxifloxacin   Review of Systems Review of Systems  Constitutional:  Positive for chills and fever.  HENT:  Positive for congestion and sore throat. Negative for ear pain.   Eyes:  Negative for discharge and redness.  Respiratory:  Positive for cough. Negative for shortness of breath and wheezing.   Gastrointestinal:  Negative for abdominal pain, diarrhea, nausea and vomiting.  Neurological:  Positive for dizziness.     Physical Exam Triage Vital Signs ED Triage Vitals  Encounter Vitals Group     BP 01/26/23 1342 (!) 148/81     Systolic BP Percentile --      Diastolic BP Percentile --      Pulse Rate 01/26/23 1342 85     Resp 01/26/23 1342 (!) 24     Temp 01/26/23 1342 98.1 F (36.7 C)     Temp Source 01/26/23 1342 Oral     SpO2 01/26/23 1342 95 %     Weight 01/26/23 1344 (!) 334 lb (151.5 kg)     Height 01/26/23 1344 5' 7.5" (1.715 m)     Head Circumference --      Peak Flow --      Pain Score 01/26/23 1344 0     Pain Loc --      Pain Education --      Exclude from Growth Chart --    No data found.  Updated Vital Signs BP (!) 148/81 (BP Location: Right Arm)   Pulse 85   Temp 98.1 F (36.7 C) (Oral)   Resp (!) 22   Ht 5' 7.5" (1.715 m)   Wt (!) 334 lb (151.5 kg)   LMP  (LMP Unknown)   SpO2 96%   BMI 51.54 kg/m   Visual Acuity Right Eye Distance:   Left Eye Distance:   Bilateral Distance:    Right Eye Near:   Left Eye Near:    Bilateral Near:     Physical Exam Vitals and nursing note reviewed.  Constitutional:      General: She is not in acute distress.    Appearance: Normal appearance. She is not ill-appearing.  HENT:     Head: Normocephalic and atraumatic.     Right Ear:  Tympanic membrane normal.     Left Ear: Tympanic membrane normal.     Nose: Congestion present.     Mouth/Throat:     Mouth: Mucous membranes are moist.     Pharynx: No oropharyngeal exudate or posterior oropharyngeal erythema.  Eyes:     Conjunctiva/sclera: Conjunctivae normal.  Cardiovascular:     Rate and Rhythm: Normal rate and regular rhythm.     Heart sounds: Normal heart sounds. No murmur heard. Pulmonary:     Effort: Pulmonary effort is normal. No respiratory distress.     Breath sounds: Normal breath sounds. No wheezing, rhonchi or rales.  Skin:    General: Skin is warm and dry.  Neurological:     General: No focal deficit present.     Mental Status: She is alert.  Psychiatric:        Mood and Affect: Mood normal.        Thought Content: Thought content normal.      UC Treatments / Results  Labs (all labs ordered are listed, but only abnormal results are displayed) Labs Reviewed  POCT FASTING CBG [facility] MANUAL ENTRY - Abnormal; Notable for the following components:      Result Value   POCT Glucose ([facility]) 100 (*)    All other components within normal limits  POC COVID19/FLU A&B COMBO - Normal    EKG   Radiology  No results found.  Procedures Procedures (including critical care time)  Medications Ordered in UC Medications - No data to display  Initial Impression / Assessment and Plan / UC Course  I have reviewed the triage vital signs and the nursing notes.  Pertinent labs & imaging results that were available during my care of the patient were reviewed by me and considered in my medical decision making (see chart for details).    Glucose normal. Rapid flu and COVID screening negative in office.  Will treat with Augmentin given comorbidities.  Patient requested Bactroban for lesions that develop inside her nose after visit was completed.  Bactroban prescribed.  Encouraged follow-up if no gradual improvement with any further concerns.  Final Clinical  Impressions(s) / UC Diagnoses   Final diagnoses:  Acute cough  Acute upper respiratory infection  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 without macular edema, without long-term current use of insulin [facility])   Discharge Instructions   None    ED Prescriptions     Medication Sig Dispense Auth. Provider   amoxicillin-clavulanate (AUGMENTIN) 875-125 MG tablet Take 1 tablet by mouth every 12 (twelve) hours. 14 tablet Erma Pinto F, PA-C   mupirocin cream (BACTROBAN) 2 % Apply 1 Application topically 2 (two) times daily. 15 g Tomi Bamberger, PA-C      PDMP not reviewed this encounter.   Tomi Bamberger, PA-C 01/31/23 0834    Tomi Bamberger, PA-C 01/31/23 [phone]

## 2023-07-24 ENCOUNTER — Other Ambulatory Visit: Payer: Self-pay

## 2023-07-24 ENCOUNTER — Encounter: Payer: Self-pay | Admitting: *Deleted

## 2023-07-24 ENCOUNTER — Emergency Department (HOSPITAL_COMMUNITY)
Admission: EM | Admit: 2023-07-24 | Discharge: 2023-07-24 | Disposition: A | Discharge status: Home / Self Care | Source: Ambulatory Visit | Attending: Emergency Medicine | Admitting: Emergency Medicine

## 2023-07-24 ENCOUNTER — Encounter (HOSPITAL_COMMUNITY): Payer: Self-pay

## 2023-07-24 ENCOUNTER — Emergency Department (HOSPITAL_COMMUNITY)

## 2023-07-24 ENCOUNTER — Ambulatory Visit
Admission: EM | Admit: 2023-07-24 | Discharge: 2023-07-24 | Disposition: A | Discharge status: Home / Self Care | Attending: Physician Assistant | Admitting: Physician Assistant

## 2023-07-24 DIAGNOSIS — I10 Essential (primary) hypertension: Secondary | ICD-10-CM | POA: Diagnosis not present

## 2023-07-24 DIAGNOSIS — R739 Hyperglycemia, unspecified: Secondary | ICD-10-CM

## 2023-07-24 DIAGNOSIS — Z79899 Other long term (current) drug therapy: Secondary | ICD-10-CM | POA: Insufficient documentation

## 2023-07-24 DIAGNOSIS — R200 Anesthesia of skin: Secondary | ICD-10-CM

## 2023-07-24 DIAGNOSIS — E1165 Type 2 diabetes mellitus with hyperglycemia: Secondary | ICD-10-CM | POA: Diagnosis not present

## 2023-07-24 DIAGNOSIS — Z7984 Long term (current) use of oral hypoglycemic drugs: Secondary | ICD-10-CM | POA: Insufficient documentation

## 2023-07-24 DIAGNOSIS — R42 Dizziness and giddiness: Secondary | ICD-10-CM | POA: Diagnosis not present

## 2023-07-24 DIAGNOSIS — R202 Paresthesia of skin: Secondary | ICD-10-CM

## 2023-07-24 HISTORY — DX: Type 2 diabetes mellitus without complications: E11.9

## 2023-07-24 LAB — I-STAT CHEM 8, ED
BUN: 11 mg/dL (ref 6–20)
Calcium, Ion: 1.13 mmol/L — ABNORMAL LOW (ref 1.15–1.40)
Chloride: 98 mmol/L (ref 98–111)
Creatinine, Ser: 0.7 mg/dL (ref 0.44–1.00)
Glucose, Bld: 331 mg/dL — ABNORMAL HIGH (ref 70–99)
HCT: 42 % (ref 36.0–46.0)
Hemoglobin: 14.3 g/dL (ref 12.0–15.0)
Potassium: 3.7 mmol/L (ref 3.5–5.1)
Sodium: 135 mmol/L (ref 135–145)
TCO2: 25 mmol/L (ref 22–32)

## 2023-07-24 LAB — CBC
HCT: 40 % (ref 36.0–46.0)
Hemoglobin: 12.9 g/dL (ref 12.0–15.0)
MCH: 28.7 pg (ref 26.0–34.0)
MCHC: 32.3 g/dL (ref 30.0–36.0)
MCV: 88.9 fL (ref 80.0–100.0)
Platelets: 307 K/uL (ref 150–400)
RBC: 4.5 MIL/uL (ref 3.87–5.11)
RDW: 13.6 % (ref 11.5–15.5)
WBC: 5.4 K/uL (ref 4.0–10.5)
nRBC: 0 % (ref 0.0–0.2)

## 2023-07-24 LAB — COMPREHENSIVE METABOLIC PANEL WITH GFR
ALT: 20 U/L (ref 0–44)
AST: 20 U/L (ref 15–41)
Albumin: 3.8 g/dL (ref 3.5–5.0)
Alkaline Phosphatase: 83 U/L (ref 38–126)
Anion gap: 15 (ref 5–15)
BUN: 13 mg/dL (ref 6–20)
CO2: 23 mmol/L (ref 22–32)
Calcium: 9.1 mg/dL (ref 8.9–10.3)
Chloride: 98 mmol/L (ref 98–111)
Creatinine, Ser: 0.74 mg/dL (ref 0.44–1.00)
GFR, Estimated: >60 mL/min (ref >60)
Glucose, Bld: 323 mg/dL — ABNORMAL HIGH (ref 70–99)
Potassium: 3.7 mmol/L (ref 3.5–5.1)
Sodium: 136 mmol/L (ref 135–145)
Total Bilirubin: 0.6 mg/dL (ref 0.0–1.2)
Total Protein: 8.3 g/dL — ABNORMAL HIGH (ref 6.5–8.1)

## 2023-07-24 LAB — URINALYSIS, ROUTINE W REFLEX MICROSCOPIC
Bacteria, UA: NONE SEEN
Bilirubin Urine: NEGATIVE
Glucose, UA: 500 mg/dL — AB
Hgb urine dipstick: NEGATIVE
Ketones, ur: NEGATIVE mg/dL
Leukocytes,Ua: NEGATIVE
Nitrite: NEGATIVE
Protein, ur: NEGATIVE mg/dL
Specific Gravity, Urine: 1.029 (ref 1.005–1.030)
pH: 5 (ref 5.0–8.0)

## 2023-07-24 LAB — POCT FASTING CBG KUC MANUAL ENTRY: POCT Glucose (KUC): 319 mg/dL — AB (ref 70–99)

## 2023-07-24 LAB — CBG MONITORING, ED
Glucose-Capillary: 333 mg/dL — ABNORMAL HIGH (ref 70–99)
Glucose-Capillary: 254 mg/dL — ABNORMAL HIGH (ref 70–99)

## 2023-07-24 LAB — POCT URINALYSIS DIP (MANUAL ENTRY)
Bilirubin, UA: NEGATIVE
Blood, UA: NEGATIVE
Glucose, UA: 500 mg/dL — AB
Ketones, POC UA: NEGATIVE mg/dL
Leukocytes, UA: NEGATIVE
Nitrite, UA: NEGATIVE
Protein Ur, POC: NEGATIVE mg/dL
Spec Grav, UA: 1.015 (ref 1.010–1.025)
Urobilinogen, UA: 0.2 U/dL
pH, UA: 5.5 (ref 5.0–8.0)

## 2023-07-24 MED ORDER — SODIUM CHLORIDE 0.9 % IV BOLUS
1000.0000 mL | Freq: Once | INTRAVENOUS | Status: AC
  Administered 2023-07-24: 1000 mL via INTRAVENOUS

## 2023-07-24 NOTE — ED Triage Notes (Addendum)
 Pt reports her blood sugar has been elevated over the weekend in the 300-400. She also is having dizziness, tunnel vision, bilateral leg swelling, and tingling in her left arm. She was unable to reach her PCP to be seen today. She takes glipizide  for diabetes.

## 2023-07-24 NOTE — ED Triage Notes (Signed)
 Patient said she has had hyperglycemia in 400s since Thursday. Takes glipizide  daily. Increased swelling in both legs. Increased urination. Stated the middle of her chest is tight and she has had increased dizziness. Complaining of episodes of tunnel vision since Thursday.

## 2023-07-24 NOTE — Discharge Instructions (Signed)
 Given your symptoms I think it is reasonable to go to the ER for further evaluation and management.

## 2023-07-24 NOTE — Discharge Instructions (Signed)
 Follow-up with your new primary care doctor.  Blood pressure seems to be doing fine.  Would recommend keeping a logbook of your blood pressure checking it once daily and recording the date and what the blood pressure was.  Also regarding your blood sugars.  Repeat blood sugar was 254.  Could consider increasing the Glucotrol  XL or keeping it at the 5 mg once a day and just watching diet very carefully.  Glucotrol  XL can be taken 10 mg once a day.

## 2023-07-24 NOTE — ED Notes (Signed)
 Patient is being discharged from the Urgent Care and sent to the Emergency Department via POV . Per Rocky Gilford, PA-C, patient is in need of higher level of care due to dizziness, high blood sugar, arm tingling. Patient is aware and verbalizes understanding of plan of care.  Vitals:   07/24/23 0903  BP: 127/89  Pulse: 90  Resp: 16  Temp: 97.6 F (36.4 C)  SpO2: 94%

## 2023-07-24 NOTE — ED Provider Notes (Signed)
 [name]    [name]: [id number] Arrival date & time: 07/24/23  0848      History   Chief Complaint Chief Complaint  Patient presents with   Hyperglycemia    HPI Jaime Castaneda is a 53 y.o. female.   Patient presents today with a weeklong history of worsening hyperglycemia.  She reports that over the past several days her blood sugars have been as high as 411.  She denies any change to her diet or medication.  She does report that she has been treated for diabetes for several years and was initially on metformin but this was ineffective so this medication was discontinued.  She was then switched to Ozempic but could not tolerate this and at her last visit her primary care recommended starting Mounjaro but she was concerned about starting this medicine.  She reports that over the past week her symptoms have significantly worsened and she has had intermittent lightheadedness/tunnel vision as well as numbness and tingling in her extremities.  She denies any nausea, vomiting, shortness of breath.  She denies hospitalization related to diabetes.  She does not take any insulin .  She reports just feeling very poorly and has worried about her blood sugar being as elevated as it is.  She reports significant lower extremity edema but denies any history of heart failure.    Past Medical History:  Diagnosis Date   Anemia    Anxiety    Arthritis of knee    bilateral   Asthma    Back pain    CFS (chronic fatigue syndrome)    Constipation    Depression    Diabetes mellitus without complication (HCC)    Dyspnea    Fibroid    GERD (gastroesophageal reflux disease)    Joint pain    Lower extremity edema    Metabolic syndrome    Migraine    Obesity    Prediabetes    Pulmonary embolus (HCC)    Scleritis    TMJ arthralgia    Vitamin D deficiency     Patient Active Problem List   Diagnosis Date Noted   Dizzy spells 01/26/2023   History of panic attacks 01/26/2023   TMJ  (temporomandibular joint disorder) 01/26/2023   DOE (dyspnea on exertion) 01/26/2023   Primary osteoarthritis of both knees 10/08/2021   Chronic bilateral low back pain with bilateral sciatica 11/16/2020   Sleep-disordered breathing 11/16/2020   Primary insomnia 06/22/2020   Bleeding 03/24/2020   Gastroesophageal reflux disease without esophagitis 03/16/2020   Primary hypertension 03/16/2020   Snoring 03/16/2020   Vitamin B12 deficiency 03/16/2020   History of pulmonary embolism 03/02/2020   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 without macular edema, without long-term current use of insulin  (HCC) 03/02/2020   Menorrhagia 02/17/2020   Pulmonary emboli (HCC) 02/17/2020   Other chest pain    Shortness of breath    Vaginal bleeding    Hypertensive retinopathy of both eyes 09/26/2017   Nuclear sclerotic cataract of both eyes 09/26/2017   Autoimmune deficiency syndrome 09/16/2016   H/O Bell's palsy 09/16/2016   Scleritis 09/16/2016   Morbid obesity (HCC) 06/27/2015   Severe headache 06/27/2015   Vision changes 06/27/2015   Metabolic syndrome 06/27/2015   Migraine 06/27/2015   Mild intermittent asthma, uncomplicated 04/13/2015   Adjustment disorder with anxiety 02/14/2014   BV (bacterial vaginosis) 02/14/2014   Iron deficiency anemia 10/23/2012   Adjustment insomnia 09/26/2012   Candidal vulvovaginitis  06/09/2012   Elevated blood pressure reading 10/18/2010    Past Surgical History:  Procedure Laterality Date   ABLATION     uterine   SHOULDER SURGERY     TUBAL LIGATION  2003    OB History     Gravida  2   Para  2   Term  2   Preterm      AB      Living  2      SAB      IAB      Ectopic      Multiple      Live Births               Home Medications    Prior to Admission medications   Medication Sig Start Date End Date Taking? Authorizing Provider  albuterol  (VENTOLIN  HFA) 108 (90 Base) MCG/ACT  inhaler Inhale 2 puffs into the lungs every 6 (six) hours as needed for wheezing or shortness of breath.    [provider]  amoxicillin -clavulanate (AUGMENTIN ) 875-125 MG tablet Take 1 tablet by mouth every 12 (twelve) hours. 01/26/23   Billy Asberry FALCON, PA-C  clobetasol (TEMOVATE) 0.05 % external solution Apply 1 Application topically 2 (two) times daily. 12/21/21   [provider]  DULoxetine  (CYMBALTA ) 30 MG capsule Take 1 capsule by mouth daily. 08/31/22   [provider]  fluticasone  (FLONASE ) 50 MCG/ACT nasal spray Place 1 spray into both nostrils 2 (two) times daily. 06/22/20   [provider]  glipiZIDE  (GLUCOTROL  XL) 5 MG 24 hr tablet Take 1 tablet by mouth daily. 12/08/22   [provider]  hydrOXYzine  (ATARAX ) 25 MG tablet Take 25 mg by mouth every 8 (eight) hours as needed for anxiety. 04/26/21   [provider]  ibuprofen  (ADVIL ) 200 MG tablet Take 200 mg by mouth every 6 (six) hours as needed. 02/14/20   [provider]  losartan -hydrochlorothiazide (HYZAAR) 50-12.5 MG tablet Take 1 tablet by mouth daily. 09/24/21   [provider]  mupirocin  cream (BACTROBAN ) 2 % Apply 1 Application topically 2 (two) times daily. 01/26/23   Billy Asberry FALCON, PA-C  naproxen  (NAPROSYN ) 500 MG tablet Take 500 mg by mouth 2 (two) times daily with a meal. 07/16/21   [provider]  norethindrone (AYGESTIN) 5 MG tablet Take 10 mg by mouth daily.    [provider]  ondansetron  (ZOFRAN  ODT) 4 MG disintegrating tablet Take 1 tablet (4 mg total) by mouth every 8 (eight) hours as needed for nausea or vomiting. 04/18/20   Roselyn Carlin NOVAK, MD  Semaglutide,0.25 or 0.5MG /DOS, (OZEMPIC, 0.25 OR 0.5 MG/DOSE,) 2 MG/3ML [facility]  09/16/22   [provider]  triamcinolone  acetonide (KENALOG -40) 40 MG/ML injection Inject 40 mg into the muscle once. 05/04/21   [provider]    Family History Family History  Problem Relation  Age of Onset   Diabetes Mother    Hypertension Mother    Heart disease Mother    Depression Mother    Anxiety disorder Mother    Obesity Mother    Heart disease Other    Thyroid disease Other    Bone cancer Other    High blood pressure Other    Migraines Neg Hx     Social History Social History   Tobacco Use   Smoking status: Never   Smokeless tobacco: Never  Vaping Use   Vaping status: Never Used  Substance Use Topics   Alcohol use: No  Drug use: No     Allergies   Ferric carboxymaltose, Cephalexin , and Moxifloxacin   Review of Systems Review of Systems  Constitutional:  Positive for activity change. Negative for appetite change, fatigue and fever.  Eyes:  Negative for visual disturbance.  Respiratory:  Negative for shortness of breath.   Cardiovascular:  Positive for leg swelling. Negative for chest pain and palpitations.  Gastrointestinal:  Negative for abdominal pain, diarrhea, nausea and vomiting.  Neurological:  Positive for dizziness, weakness (Generalized) and light-headedness. Negative for syncope, speech difficulty and headaches.     Physical Exam Triage Vital Signs ED Triage Vitals  Encounter Vitals Group     BP 07/24/23 0903 127/89     Girls Systolic BP Percentile --      Girls Diastolic BP Percentile --      Boys Systolic BP Percentile --      Boys Diastolic BP Percentile --      Pulse Rate 07/24/23 0903 90     Resp 07/24/23 0903 16     Temp 07/24/23 0903 97.6 F (36.4 C)     Temp Source 07/24/23 0903 Oral     SpO2 07/24/23 0903 94 %     Weight --      Height --      Head Circumference --      Peak Flow --      Pain Score 07/24/23 0904 8     Pain Loc --      Pain Education --      Exclude from Growth Chart --    No data found.  Updated Vital Signs BP 127/89 (BP Location: Right Arm)   Pulse 90   Temp 97.6 F (36.4 C) (Oral)   Resp 16   SpO2 94%   Visual Acuity Right Eye Distance:   Left Eye Distance:   Bilateral Distance:     Right Eye Near:   Left Eye Near:    Bilateral Near:     Physical Exam Vitals reviewed.  Constitutional:      General: She is awake. She is not in acute distress.    Appearance: Normal appearance. She is well-developed. She is not ill-appearing.     Comments: Very pleasant female stated age in no acute distress sitting comfortably in exam room  HENT:     Head: Normocephalic and atraumatic.  Cardiovascular:     Rate and Rhythm: Normal rate and regular rhythm.     Heart sounds: Normal heart sounds, S1 normal and S2 normal. No murmur heard.    Comments: Nonpitting edema to mid anterior tibia bilaterally Pulmonary:     Effort: Pulmonary effort is normal.     Breath sounds: Normal breath sounds. No wheezing, rhonchi or rales.     Comments: Clear to auscultation bilaterally Abdominal:     General: Bowel sounds are normal.     Palpations: Abdomen is soft.     Tenderness: There is no abdominal tenderness. There is no right CVA tenderness, left CVA tenderness, guarding or rebound.  Psychiatric:        Behavior: Behavior is cooperative.      UC Treatments / Results  Labs (all labs ordered are listed, but only abnormal results are displayed) Labs Reviewed  POCT FASTING CBG [facility] MANUAL ENTRY - Abnormal; Notable for the following components:      Result Value   POCT Glucose ([facility]) 319 (*)    All other components within normal limits  POCT URINALYSIS DIP (MANUAL ENTRY) -  Abnormal; Notable for the following components:   Glucose, UA =500 (*)    All other components within normal limits    EKG   Radiology No results found.  Procedures Procedures (including critical care time)  Medications Ordered in UC Medications - No data to display  Initial Impression / Assessment and Plan / UC Course  I have reviewed the triage vital signs and the nursing notes.  Pertinent labs & imaging results that were available during my care of the patient were reviewed by me and considered in my  medical decision making (see chart for details).     Patient is well-appearing, afebrile, nontoxic, nontachycardic.  UA showed glucosuria without any evidence of ketones but she was hyperglycemic.  Given she is significantly symptomatic we discussed potential utility of going to the emergency room since we do not have stat lab capabilities in urgent care.  She reports that this is reasonable as she is feeling poorly and is concerned about her ongoing symptoms.  I did offer her EMS transport but she declined this.  She will go directly to the emergency room for further evaluation and management.  She was stable at time of discharge.  Final Clinical Impressions(s) / UC Diagnoses   Final diagnoses:  Hyperglycemia  Episodic lightheadedness  Numbness and tingling in both hands     Discharge Instructions      Given your symptoms I think it is reasonable to go to the ER for further evaluation and management.    ED Prescriptions   None    PDMP not reviewed this encounter.   Sherrell Rocky POUR, PA-C 07/24/23 9065

## 2023-07-24 NOTE — ED Provider Notes (Addendum)
 [place] [facility] Provider Note   [name]: [id number] Arrival date & time: 07/24/23  9045     Patient presents with: Hyperglycemia   Jaime Castaneda is a 53 y.o. female.   Patient sent in from the [facility] urgent care.  Patient known diabetic had been sort of followed at [facility] but not very closely.  States that her blood sugars have been in the 400s since Thursday.  Here the blood sugars 333.  Also with a complaint of some leg swelling occasionally gets some dizziness some blurred vision increased urination.  She has noticed that that is what happens when her blood sugars get high.  Patient is taking glipizide  daily.  Denies any fevers denies any abdominal pain any chest pain or shortness of breath.  Temp here 98 pulse 94 respirations 19 blood pressure 167/93 oxygen saturation is 99% on room air.  Patient's glipizide  XL seems to be 5 mg 24-hour tablet.  Past medical history significant for asthma history of migraines lower extremity edema chronic fatigue syndrome gastroesophageal reflux disease pulmonary embolism listed but not sure when and diabetes without complications.  Past surgical history significant for tubal ligation.  Patient is never used tobacco products.  Patient last seen in urgent care or emergency department other than today in January 2025.       Prior to Admission medications   Medication Sig Start Date End Date Taking? Authorizing Provider  albuterol  (VENTOLIN  HFA) 108 (90 Base) MCG/ACT inhaler Inhale 2 puffs into the lungs every 6 (six) hours as needed for wheezing or shortness of breath.    [provider]  amoxicillin -clavulanate (AUGMENTIN ) 875-125 MG tablet Take 1 tablet by mouth every 12 (twelve) hours. 01/26/23   Billy Asberry FALCON, PA-C  clobetasol (TEMOVATE) 0.05 % external solution Apply 1 Application topically 2 (two) times daily. 12/21/21   [provider]  DULoxetine  (CYMBALTA ) 30 MG capsule Take 1  capsule by mouth daily. 08/31/22   [provider]  fluticasone  (FLONASE ) 50 MCG/ACT nasal spray Place 1 spray into both nostrils 2 (two) times daily. 06/22/20   [provider]  glipiZIDE  (GLUCOTROL  XL) 5 MG 24 hr tablet Take 1 tablet by mouth daily. 12/08/22   [provider]  hydrOXYzine  (ATARAX ) 25 MG tablet Take 25 mg by mouth every 8 (eight) hours as needed for anxiety. 04/26/21   [provider]  ibuprofen  (ADVIL ) 200 MG tablet Take 200 mg by mouth every 6 (six) hours as needed. 02/14/20   [provider]  losartan -hydrochlorothiazide (HYZAAR) 50-12.5 MG tablet Take 1 tablet by mouth daily. 09/24/21   [provider]  mupirocin  cream (BACTROBAN ) 2 % Apply 1 Application topically 2 (two) times daily. 01/26/23   Billy Asberry FALCON, PA-C  naproxen  (NAPROSYN ) 500 MG tablet Take 500 mg by mouth 2 (two) times daily with a meal. 07/16/21   [provider]  norethindrone (AYGESTIN) 5 MG tablet Take 10 mg by mouth daily.    [provider]  ondansetron  (ZOFRAN  ODT) 4 MG disintegrating tablet Take 1 tablet (4 mg total) by mouth every 8 (eight) hours as needed for nausea or vomiting. 04/18/20   Roselyn Carlin NOVAK, MD  Semaglutide,0.25 or 0.5MG /DOS, (OZEMPIC, 0.25 OR 0.5 MG/DOSE,) 2 MG/3ML [facility]  09/16/22   [provider]  triamcinolone  acetonide (KENALOG -40) 40 MG/ML injection Inject 40 mg into the muscle once. 05/04/21   [provider]    Allergies: Ferric carboxymaltose, Sulfa  antibiotics, Cephalexin , and  Moxifloxacin    Review of Systems  Constitutional:  Negative for chills and fever.  HENT:  Negative for ear pain and sore throat.   Eyes:  Positive for visual disturbance. Negative for pain.  Respiratory:  Negative for cough and shortness of breath.   Cardiovascular:  Positive for leg swelling. Negative for chest pain and palpitations.  Gastrointestinal:  Negative for abdominal pain and vomiting.  Genitourinary:   Positive for frequency. Negative for dysuria and hematuria.  Musculoskeletal:  Negative for arthralgias and back pain.  Skin:  Negative for color change and rash.  Neurological:  Negative for seizures and syncope.  All other systems reviewed and are negative.   Updated Vital Signs BP (!) 167/93   Pulse 94   Temp 98 F (36.7 C) (Oral)   Resp 19   Ht 1.702 m (5' 7)   Wt (!) 154.2 kg   SpO2 99%   BMI 53.25 kg/m   Physical Exam Vitals and nursing note reviewed.  Constitutional:      General: She is not in acute distress.    Appearance: Normal appearance. She is well-developed.  HENT:     Head: Normocephalic and atraumatic.  Eyes:     Extraocular Movements: Extraocular movements intact.     Conjunctiva/sclera: Conjunctivae normal.     Pupils: Pupils are equal, round, and reactive to light.  Cardiovascular:     Rate and Rhythm: Normal rate and regular rhythm.     Heart sounds: No murmur heard. Pulmonary:     Effort: Pulmonary effort is normal. No respiratory distress.     Breath sounds: Normal breath sounds.  Abdominal:     Palpations: Abdomen is soft.     Tenderness: There is no abdominal tenderness.  Musculoskeletal:        General: No swelling.     Cervical back: Neck supple.     Right lower leg: No edema.     Left lower leg: No edema.     Comments: No significant lower extremity edema  Skin:    General: Skin is warm and dry.     Capillary Refill: Capillary refill takes less than 2 seconds.  Neurological:     General: No focal deficit present.     Mental Status: She is alert and oriented to person, place, and time.  Psychiatric:        Mood and Affect: Mood normal.     (all labs ordered are listed, but only abnormal results are displayed) Labs Reviewed  URINALYSIS, ROUTINE W REFLEX MICROSCOPIC - Abnormal; Notable for the following components:      Result Value   Color, Urine STRAW (*)    Glucose, UA 500 (*)    All other components within normal limits   COMPREHENSIVE METABOLIC PANEL WITH GFR - Abnormal; Notable for the following components:   Glucose, Bld 323 (*)    Total Protein 8.3 (*)    All other components within normal limits  CBG MONITORING, ED - Abnormal; Notable for the following components:   Glucose-Capillary 333 (*)    All other components within normal limits  I-STAT CHEM 8, ED - Abnormal; Notable for the following components:   Glucose, Bld 331 (*)    Calcium , Ion 1.13 (*)    All other components within normal limits  CBC    EKG: EKG Interpretation Date/Time:  Monday July 24 2023 11:09:48 EDT Ventricular Rate:  91 PR Interval:  168 QRS Duration:  106 QT Interval:  381 QTC Calculation:  469 R Axis:   -30  Text Interpretation: Sinus rhythm Low voltage, precordial leads Abnormal R-wave progression, early transition Left ventricular hypertrophy No significant change since last tracing Confirmed by Effrey Davidow [phone]) on 07/24/2023 11:14:32 AM  Radiology: [facility] Chest Port 1 View Result Date: 07/24/2023 CLINICAL DATA:  Hypertension and hyperglycemia. Mid chest tightness and increased dizziness. Episodes of tunnel vision. EXAM: PORTABLE CHEST 1 VIEW COMPARISON:  01/26/2023 FINDINGS: Poor inspiration. Normal sized heart. Clear lungs with normal vascularity. Thoracic spine degenerative changes. IMPRESSION: No acute abnormality. Electronically Signed   By: Elspeth Bathe M.D.   On: 07/24/2023 11:18     Procedures   Medications Ordered in the ED  sodium chloride  0.9 % bolus 1,000 mL (1,000 mLs Intravenous New Bag/Given 07/24/23 1059)                                    Medical Decision Making Amount and/or Complexity of Data Reviewed Labs: ordered. Radiology: ordered.   Patient's blood sugar here is 333.  Patient's i-STAT electrolytes are normal.  CO2 25 renal function is normal as well.  All very reassuring.  Will give some IV fluids.  Will check CBC complete metabolic panel.  No evidence of any DKA or any  significant hyperglycemia currently.  Feel the patient's symptoms mostly prickly the vision and increased urination probably secondary to blood sugars being high.  Patient states that the [facility] the urgent care has referred her to a new primary care doctor that they are going to have in that building.  She will start being followed by [facility] primary care.  Initial vital signs significant for blood pressure of 167/93 oxygen saturation 99%.  Temp 98 pulse 94.  Looks as if in 2023 patient was on losartan  hydrochlorothiazide.  Not clear whether she is on any antihypertensive meds currently.  May need that started as well.  Urinalysis negative for urinary tract infection.  I-STAT Chem-8 as mentioned above.  CBC white count 5.4 hemoglobin 12.9 platelets 307 complete metabolic panel CO2 is 23 blood sugar 323.  Renal functions normal.  LFTs normal.  Will recheck her blood sugar.  Chest x-ray no acute abnormalities.  Patient's repeat blood sugar after some fluids was 254.  And repeat blood pressure 129/78 which is quite normal.  So no adjustment needed based on that with her blood pressure medicine.  Patient states she still taking her glipizide  XL 5 mg a day.  In theory could double that to 10 mg a day.  And follow her blood sugars at home.  Or she could just trend it and follow-up with her new primary care doctor    Final diagnoses:  Hyperglycemia  Primary hypertension    ED Discharge Orders     None          Dorion Petillo, MD 07/24/23 1039    Geraldene Hamilton, MD 07/24/23 1040    Geraldene Hamilton, MD 07/24/23 1214    Geraldene Hamilton, MD 07/24/23 [phone]

## 2023-07-26 ENCOUNTER — Ambulatory Visit: Admitting: Family

## 2023-07-26 ENCOUNTER — Ambulatory Visit: Payer: Self-pay | Admitting: Family

## 2023-07-26 ENCOUNTER — Encounter: Payer: Self-pay | Admitting: Family

## 2023-07-26 VITALS — BP 133/86 | HR 79 | Temp 98.5°F | Resp 18 | Ht 67.5 in | Wt 349.4 lb

## 2023-07-26 DIAGNOSIS — Z7984 Long term (current) use of oral hypoglycemic drugs: Secondary | ICD-10-CM

## 2023-07-26 DIAGNOSIS — M25562 Pain in left knee: Secondary | ICD-10-CM

## 2023-07-26 DIAGNOSIS — E119 Type 2 diabetes mellitus without complications: Secondary | ICD-10-CM

## 2023-07-26 DIAGNOSIS — M549 Dorsalgia, unspecified: Secondary | ICD-10-CM

## 2023-07-26 DIAGNOSIS — M25561 Pain in right knee: Secondary | ICD-10-CM

## 2023-07-26 DIAGNOSIS — F418 Other specified anxiety disorders: Secondary | ICD-10-CM | POA: Diagnosis not present

## 2023-07-26 DIAGNOSIS — G8929 Other chronic pain: Secondary | ICD-10-CM

## 2023-07-26 DIAGNOSIS — Z23 Encounter for immunization: Secondary | ICD-10-CM

## 2023-07-26 DIAGNOSIS — F32A Depression, unspecified: Secondary | ICD-10-CM

## 2023-07-26 DIAGNOSIS — Z7689 Persons encountering health services in other specified circumstances: Secondary | ICD-10-CM

## 2023-07-26 DIAGNOSIS — I10 Essential (primary) hypertension: Secondary | ICD-10-CM | POA: Diagnosis not present

## 2023-07-26 DIAGNOSIS — R739 Hyperglycemia, unspecified: Secondary | ICD-10-CM

## 2023-07-26 DIAGNOSIS — B3731 Acute candidiasis of vulva and vagina: Secondary | ICD-10-CM

## 2023-07-26 DIAGNOSIS — E1165 Type 2 diabetes mellitus with hyperglycemia: Secondary | ICD-10-CM

## 2023-07-26 LAB — POCT GLYCOSYLATED HEMOGLOBIN (HGB A1C): Hemoglobin A1C: 10.8 % — AB (ref 4.0–5.6)

## 2023-07-26 MED ORDER — LOSARTAN POTASSIUM-HCTZ 50-12.5 MG PO TABS: 1.0000 | ORAL_TABLET | Freq: Every day | ORAL | 0 refills | Status: AC

## 2023-07-26 MED ORDER — INSULIN GLARGINE 100 UNIT/ML SOLOSTAR PEN: 10.0000 [IU] | PEN_INJECTOR | Freq: Every day | SUBCUTANEOUS | 0 refills | Status: AC

## 2023-07-26 MED ORDER — TRIAMCINOLONE ACETONIDE 40 MG/ML IJ SUSP
40.0000 mg | Freq: Once | INTRAMUSCULAR | Status: AC
  Administered 2023-07-27: 40 mg via INTRAMUSCULAR

## 2023-07-26 MED ORDER — GLIPIZIDE ER 10 MG PO TB24: 10.0000 mg | ORAL_TABLET | Freq: Every day | ORAL | 2 refills | Status: DC

## 2023-07-26 MED ORDER — FLUCONAZOLE 150 MG PO TABS: 150.0000 mg | ORAL_TABLET | ORAL | 0 refills | Status: AC

## 2023-07-26 MED ORDER — NAPROXEN 500 MG PO TABS: 500.0000 mg | ORAL_TABLET | Freq: Two times a day (BID) | ORAL | 0 refills | Status: AC

## 2023-07-26 MED ORDER — DULOXETINE HCL 30 MG PO CPEP: 30.0000 mg | ORAL_CAPSULE | Freq: Every day | ORAL | 0 refills | Status: AC

## 2023-07-26 NOTE — Progress Notes (Signed)
 Subjective:    Jaime Castaneda - 53 y.o. female MRN [id number]  Date of birth: [birth year]  HPI  RANEY Castaneda is to establish care and Emergency Department follow-up.   Current issues and/or concerns: - Patient recently seen on 07/24/2023 (3 hours) at [facility] Emergency Department at [facility] for primary hypertension and hyperglycemia. Today she denies red flag symptoms. She is doing well on medication regimen.  - High blood pressure. Doing well on Losartan -Hydrochlorothiazide, no issues/concerns. She limits salt intake. Exercise limited by chronic back/bilateral knee pain. She does not complain of red flag symptoms such as but not limited to chest pain, shortness of breath, worst headache of life, nausea/vomiting.  - Type 2 diabetes. Doing well on Glipizide  10 mg daily. She limits sugar intake. Exercise limited by chronic back/bilateral knee pain. Home blood sugars 300's - 400's. Denies red flag symptoms associated with diabetes.  - States up to date on diabetic eye exam.  - Due for diabetic foot exam.  - Anxiety depression. Doing well on Duloxetine , no issues/concerns. She denies thoughts of self-harm, suicidal ideations, homicidal ideations. - Chronic back and chronic bilateral knee pain. Denies recent trauma/injury and red flag symptoms. Taking Naproxen  and Duloxetine  to help. Requests referral to specialist.  - States frequent yeast infections. Requests Fluconazole .  - No further issues/concerns for discussion today.    ROS per HPI     Health Maintenance:  Health Maintenance Due  Topic Date Due   FOOT EXAM  Never done   OPHTHALMOLOGY EXAM  Never done   Diabetic kidney evaluation - Urine ACR  Never done   Hepatitis C Screening  Never done   Colonoscopy  Never done   MAMMOGRAM  Never done   HEMOGLOBIN A1C  08/16/2020   Cervical Cancer Screening (HPV/Pap Cotest)  01/06/2021     Past Medical History: Patient Active Problem List   Diagnosis Date Noted    Dizzy spells 01/26/2023   History of panic attacks 01/26/2023   TMJ (temporomandibular joint disorder) 01/26/2023   DOE (dyspnea on exertion) 01/26/2023   Primary osteoarthritis of both knees 10/08/2021   Chronic bilateral low back pain with bilateral sciatica 11/16/2020   Sleep-disordered breathing 11/16/2020   Primary insomnia 06/22/2020   Bleeding 03/24/2020   Gastroesophageal reflux disease without esophagitis 03/16/2020   Primary hypertension 03/16/2020   Snoring 03/16/2020   Vitamin B12 deficiency 03/16/2020   History of pulmonary embolism 03/02/2020   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 without macular edema, without long-term current use of insulin  (HCC) 03/02/2020   Menorrhagia 02/17/2020   Pulmonary emboli (HCC) 02/17/2020   Other chest pain    Shortness of breath    Vaginal bleeding    Hypertensive retinopathy of both eyes 09/26/2017   Nuclear sclerotic cataract of both eyes 09/26/2017   Autoimmune deficiency syndrome 09/16/2016   H/O Bell's palsy 09/16/2016   Scleritis 09/16/2016   Morbid obesity (HCC) 06/27/2015   Severe headache 06/27/2015   Vision changes 06/27/2015   Metabolic syndrome 06/27/2015   Migraine 06/27/2015   Mild intermittent asthma, uncomplicated 04/13/2015   Adjustment disorder with anxiety 02/14/2014   BV (bacterial vaginosis) 02/14/2014   Iron deficiency anemia 10/23/2012   Adjustment insomnia 09/26/2012   Candidal vulvovaginitis 06/09/2012   Elevated blood pressure reading 10/18/2010      Social History   reports that she has never smoked. She has never used smokeless tobacco. She reports that she does not drink alcohol  and does not use drugs.   Family History  family history includes Anxiety disorder in her mother; Bone cancer in an other family member; Depression in her mother; Diabetes in her mother; Heart disease in her mother and another family member; High blood pressure in an other  family member; Hypertension in her mother; Obesity in her mother; Thyroid disease in an other family member.   Medications: reviewed and updated   Objective:   Physical Exam BP 133/86   Pulse 79   Temp 98.5 F (36.9 C) (Oral)   Resp 18   Ht 5' 7.5 (1.715 m)   Wt (!) 349 lb 6.4 oz (158.5 kg)   LMP  (Approximate)   SpO2 96%   BMI 53.92 kg/m   Physical Exam HENT:     Head: Normocephalic and atraumatic.     Nose: Nose normal.     Mouth/Throat:     Mouth: Mucous membranes are moist.     Pharynx: Oropharynx is clear.  Eyes:     Extraocular Movements: Extraocular movements intact.     Conjunctiva/sclera: Conjunctivae normal.     Pupils: Pupils are equal, round, and reactive to light.  Cardiovascular:     Rate and Rhythm: Normal rate and regular rhythm.     Pulses: Normal pulses.     Heart sounds: Normal heart sounds.  Pulmonary:     Effort: Pulmonary effort is normal.     Breath sounds: Normal breath sounds.  Musculoskeletal:        General: Normal range of motion.     Right shoulder: Normal.     Left shoulder: Normal.     Right upper arm: Normal.     Left upper arm: Normal.     Right elbow: Normal.     Left elbow: Normal.     Right forearm: Normal.     Left forearm: Normal.     Right wrist: Normal.     Left wrist: Normal.     Right hand: Normal.     Left hand: Normal.     Cervical back: Normal, normal range of motion and neck supple.     Thoracic back: Normal.     Lumbar back: Normal.     Right hip: Normal.     Left hip: Normal.     Right upper leg: Normal.     Left upper leg: Normal.     Right knee: Normal.     Left knee: Normal.     Right lower leg: Normal.     Left lower leg: Normal.     Right ankle: Normal.     Left ankle: Normal.     Right foot: Normal.     Left foot: Normal.  Neurological:     General: No focal deficit present.     Mental Status: She is alert and oriented to person, place, and time.  Psychiatric:        Mood and Affect: Mood  normal.        Behavior: Behavior normal.    Assessment & Plan:  1. Encounter to establish care (Primary) - Patient presents today to establish care. During the interim follow-up with primary provider as scheduled.  - Return for annual physical examination, labs, and health maintenance. Arrive fasting meaning having no food for at least 8 hours prior to appointment. You may have only water or black coffee. Please take scheduled medications as normal.  2. Primary hypertension - Continue Losartan -Hydrochlorothiazide as prescribed.  - Counseled on  blood pressure goal of less than 130/80, low-sodium, DASH diet, medication compliance, and 150 minutes of moderate intensity exercise per week as tolerated. Counseled on medication adherence and adverse effects. - Follow-up with primary provider in 3 months or sooner if needed. - losartan -hydrochlorothiazide (HYZAAR) 50-12.5 MG tablet; Take 1 tablet by mouth daily.  Dispense: 90 tablet; Refill: 0  3. Type 2 diabetes mellitus with hyperglycemia, without long-term current use of insulin  (HCC) 4. Hyperglycemia - Continue Glipizide  as prescribed. - Hemoglobin A1c result pending.  - Routine screening.  - Discussed the importance of healthy eating habits, low-carbohydrate diet, low-sugar diet, regular aerobic exercise (at least 150 minutes a week as tolerated) and medication compliance to achieve or maintain control of diabetes. Counseled on medication adherence/adverse effects. - Follow-up with primary provider as scheduled.  - Microalbumin / creatinine urine ratio - POCT glycosylated hemoglobin (Hb A1C); Future - glipiZIDE  (GLUCOTROL  XL) 10 MG 24 hr tablet; Take 1 tablet (10 mg total) by mouth daily.  Dispense: 30 tablet; Refill: 2 - POCT glycosylated hemoglobin (Hb A1C)  5. Diabetic eye exam [facility]) - Patient reports up to date.   6. Encounter for diabetic foot exam [facility]) - Referral to Podiatry for evaluation/management. - Ambulatory referral to  Podiatry  7. Anxiety and depression - Patient denies thoughts of self-harm, suicidal ideations, homicidal ideations. - Continue Duloxetine  as prescribed. Counseled on medication adherence/adverse effects.  - Follow-up with primary provider in 3 months or sooner if needed.  - DULoxetine  (CYMBALTA ) 30 MG capsule; Take 1 capsule (30 mg total) by mouth daily.  Dispense: 90 capsule; Refill: 0  8. Chronic back pain, unspecified back location, unspecified back pain laterality 9. Chronic pain of both knees - Continue Duloxetine  and Naproxen  as prescribed. Counseled on medication adherence/adverse effects.  - Triamcinolone  Acetonide injection administered in office.  - Referral to Orthopedic Surgery for evaluation/management.  - Follow-up with primary provider as scheduled. - Ambulatory referral to Orthopedic Surgery - triamcinolone  acetonide (KENALOG -40) injection 40 mg - DULoxetine  (CYMBALTA ) 30 MG capsule; Take 1 capsule (30 mg total) by mouth daily.  Dispense: 90 capsule; Refill: 0 - naproxen  (NAPROSYN ) 500 MG tablet; Take 1 tablet (500 mg total) by mouth 2 (two) times daily with a meal.  Dispense: 180 tablet; Refill: 0  10. Candida vaginitis - Empiric treatment with Fluconazole  as prescribed. Counseled on medication adherence/adverse effects.  - Follow-up with primary provider as scheduled.  - fluconazole  (DIFLUCAN ) 150 MG tablet; Take 1 tablet (150 mg total) by mouth every 3 (three) days for 3 doses.  Dispense: 3 tablet; Refill: 0  11. Immunization due - Administered. - Tdap vaccine greater than or equal to 7yo IM    Patient was given clear instructions to go to Emergency Department or return to medical center if symptoms don't improve, worsen, or new problems develop.The patient verbalized understanding.  I discussed the assessment and treatment plan with the patient. The patient was provided an opportunity to ask questions and all were answered. The patient agreed with the plan and  demonstrated an understanding of the instructions.   The patient was advised to call back or seek an in-person evaluation if the symptoms worsen or if the condition fails to improve as anticipated.    Greig Drones, NP 07/26/2023, 2:36 PM Primary Care at [facility]

## 2023-07-26 NOTE — Progress Notes (Signed)
 Nutrient  with diabetes and she does not want to take the ozempic any more.  Lower body and lower back pain management, patient to know if you wants to change her medication dosage that the ER physician prescribed. Patient scored a 10 on the PHQ-9, Patient scored a 8 on GAD-7

## 2023-07-27 ENCOUNTER — Ambulatory Visit: Admitting: Emergency Medicine

## 2023-07-27 DIAGNOSIS — G8929 Other chronic pain: Secondary | ICD-10-CM

## 2023-07-27 DIAGNOSIS — M25561 Pain in right knee: Secondary | ICD-10-CM

## 2023-07-27 DIAGNOSIS — M25562 Pain in left knee: Secondary | ICD-10-CM

## 2023-07-27 DIAGNOSIS — M549 Dorsalgia, unspecified: Secondary | ICD-10-CM | POA: Diagnosis not present

## 2023-07-27 LAB — MICROALBUMIN / CREATININE URINE RATIO
Creatinine, Urine: 188.6 mg/dL
Microalb/Creat Ratio: 10 mg/g{creat} (ref 0–29)
Microalbumin, Urine: 19.6 ug/mL

## 2023-07-31 ENCOUNTER — Encounter: Payer: Self-pay | Admitting: Family

## 2023-07-31 NOTE — Telephone Encounter (Signed)
 Schedule appointment for Toradol  injection.

## 2023-08-01 NOTE — Telephone Encounter (Signed)
 Copied from CRM #[id number]. Topic: General - Other >> Aug 01, 2023 11:16 AM Turkey B wrote: Reason for CRM: pt called back, says she wants to see if she can get the pill form of Toradol . Pt is already getting the injection.

## 2023-08-01 NOTE — Telephone Encounter (Signed)
Not recommended.

## 2023-08-03 NOTE — Telephone Encounter (Unsigned)
 Copied from CRM [id number]. Topic: Clinical - Medication Question >> Aug 03, 2023 11:55 AM Berwyn MATSU wrote: Reason for CRM:  Patient called in to request a form of medication that can equivalent to Toradol  or naproxen  as it is not working to keep her going as patient is holding up fine and the shot that was given on  07/26/23 did provide relief however she is still having slight pain however she was told to stop the naproxen  so she is requesting to know what to drink for onset relief.   CB#: [phone]  May you please advise.

## 2023-08-04 NOTE — Telephone Encounter (Signed)
 Continue Duloxetine  and Naproxen  as prescribed. Provide patient with referral contact information to Orthopedics. During the interim report to the Emergency Department/Urgent Care/call 911 for immediate medical evaluation.

## 2023-08-17 ENCOUNTER — Other Ambulatory Visit (INDEPENDENT_AMBULATORY_CARE_PROVIDER_SITE_OTHER): Payer: Self-pay

## 2023-08-17 ENCOUNTER — Ambulatory Visit (INDEPENDENT_AMBULATORY_CARE_PROVIDER_SITE_OTHER): Admitting: Physician Assistant

## 2023-08-17 DIAGNOSIS — M5442 Lumbago with sciatica, left side: Secondary | ICD-10-CM

## 2023-08-17 DIAGNOSIS — G8929 Other chronic pain: Secondary | ICD-10-CM

## 2023-08-17 DIAGNOSIS — M25562 Pain in left knee: Secondary | ICD-10-CM

## 2023-08-17 NOTE — Progress Notes (Signed)
 Office Visit Note   Patient: Jaime Castaneda           Date of Birth: [birth year]           MRN: [id number] Visit Date: 08/17/2023              Requested by: Lorren Greig PARAS, NP [address] [place],  [place] [address] PCP: Lorren Greig PARAS, NP   Assessment & Plan: Visit Diagnoses:  1. Chronic pain of left knee   2. Chronic left-sided low back pain with left-sided sciatica   3. Morbid obesity (HCC)     Plan: Given her failure of conservative treatment for her low back recommend MRI to evaluate HNP as a source of her radicular symptoms down the left leg.  In regards to her knees are unable to perform a cortisone injection in her knees today given her last hemoglobin A1c of around 10.  Recommend continued physical activity for weight loss.  Given the significant arthritis in the left knee is not felt that she would benefit from viscosupplementation injection.  She most likely will require a left total knee arthroplasty at sometime in the future.  But given her weight she is not a candidate for surgery at this point in time.  Therefore we will send her to weight loss clinic to see if they can help her in her weight loss journey.  Follow-Up Instructions: Return Follow-up after MRI to go over results.   Orders:  Orders Placed This Encounter  Procedures   XR Lumbar Spine 2-3 Views   XR Knee 1-2 Views Left   No orders of the defined types were placed in this encounter.     Procedures: No procedures performed   Clinical Data: No additional findings.   Subjective: Chief Complaint  Patient presents with   Lower Back - Pain   Left Knee - Pain    HPI Ms. Jaime Castaneda 53 year old female comes in today for back pain and bilateral knee pain left greater than right.  She has had back pain on and off for 2 years worse over the last year and a half.  Pain is across the low back.  Rates pain to be 9 out of 10 at worst 7 out of 10 most of the time.  She states that the back pain can  awaken her.  Denies fevers, chills, weight change, or saddle anesthesia like symptoms.  She is being treated for some urinary incontinence and constipation is unclear if this is acute or not.  She is had physical therapy a year ago for low back pain and that helped some.  She does stretches and as taught by therapy and continues to go to water aerobics.  She has had no epidural injections in her back. Regards to her knees she has left knee pain mostly medial aspect knee buckles/gives way.  She has tried naproxen  with no relief.  She has had therapy a year ago really did not help.  Had an injection in her knee cortisone that only lasted about a week and a half.  States her knee pain is 9 out of 10 pain constant with any mobility.  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  She is also tried [employer] offloading braces that really have not given her any relief.  She is diabetic last hemoglobin A1c is reported around 10.  Review of Systems See HPI otherwise negative  Objective:  Vital Signs: There were no vitals taken for this visit.  Physical Exam Constitutional:      Appearance: She is not ill-appearing or diaphoretic.  Cardiovascular:     Pulses: Normal pulses.  Neurological:     Mental Status: She is alert and oriented to person, place, and time.  Psychiatric:        Mood and Affect: Mood normal.     Ortho Exam Bilateral hips: Good range of motion without pain. Lower extremities: 5 out of 5 strength throughout lower extremities against resistance.  Straight leg raise is negative bilaterally.  She has full flexion lumbar spine and able to touch her toes.  Extension of the lumbar spine causes low back pain.  Dorsal pedal pulses bilateral feet are 2+.  Sensation intact bilateral feet light touch. Bilateral knees: Good range of motion of both knees.  No significant crepitus.  No instability valgus varus stressing.  Left knee she is tender along the medial  lateral joint line.  Right knee tender medial joint line only.  No abnormal warmth erythema or effusion.  Specialty Comments:  No specialty comments available.  Imaging: XR Lumbar Spine 2-3 Views Result Date: 08/17/2023 Lumbar spine 2 views: Facet changes throughout.  Grade 1 spondylolisthesis L4 on 5.  Degenerative changes at L5-S1.  No acute fractures.  No acute findings otherwise.  XR Knee 1-2 Views Left Result Date: 08/17/2023 Left knee 2 views: Shows tricompartmental arthritis with bone-on-bone medial compartment.  Lateral compartment with moderate arthritis with periarticular spurring.  Patellofemoral compartment with moderate to severe changes.  No acute fractures.  Knee is well located.    PMFS History: Patient Active Problem List   Diagnosis Date Noted   Dizzy spells 01/26/2023   History of panic attacks 01/26/2023   TMJ (temporomandibular joint disorder) 01/26/2023   DOE (dyspnea on exertion) 01/26/2023   Primary osteoarthritis of both knees 10/08/2021   Chronic bilateral low back pain with bilateral sciatica 11/16/2020   Sleep-disordered breathing 11/16/2020   Primary insomnia 06/22/2020   Bleeding 03/24/2020   Gastroesophageal reflux disease without esophagitis 03/16/2020   Primary hypertension 03/16/2020   Snoring 03/16/2020   Vitamin B12 deficiency 03/16/2020   History of pulmonary embolism 03/02/2020   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 without macular edema, without long-term current use of insulin  (HCC) 03/02/2020   Menorrhagia 02/17/2020   Pulmonary emboli (HCC) 02/17/2020   Other chest pain    Shortness of breath    Vaginal bleeding    Hypertensive retinopathy of both eyes 09/26/2017   Nuclear sclerotic cataract of both eyes 09/26/2017   Autoimmune deficiency syndrome 09/16/2016   H/O Bell's palsy 09/16/2016   Scleritis 09/16/2016   Morbid obesity (HCC) 06/27/2015   Severe headache 06/27/2015   Vision  changes 06/27/2015   Metabolic syndrome 06/27/2015   Migraine 06/27/2015   Mild intermittent asthma, uncomplicated 04/13/2015   Adjustment disorder with anxiety 02/14/2014   BV (bacterial vaginosis) 02/14/2014   Iron deficiency anemia 10/23/2012   Adjustment insomnia 09/26/2012   Candidal vulvovaginitis 06/09/2012   Elevated blood pressure reading 10/18/2010   Past Medical History:  Diagnosis Date   Anemia    Anxiety    Arthritis of knee    bilateral   Asthma    Back pain    CFS (chronic fatigue syndrome)    Constipation    Depression    Diabetes mellitus without complication (HCC)    Dyspnea    Fibroid  GERD (gastroesophageal reflux disease)    Joint pain    Lower extremity edema    Metabolic syndrome    Migraine    Obesity    Prediabetes    Pulmonary embolus (HCC)    Scleritis    TMJ arthralgia    Vitamin D deficiency     Family History  Problem Relation Age of Onset   Diabetes Mother    Hypertension Mother    Heart disease Mother    Depression Mother    Anxiety disorder Mother    Obesity Mother    Heart disease Other    Thyroid disease Other    Bone cancer Other    High blood pressure Other    Migraines Neg Hx     Past Surgical History:  Procedure Laterality Date   ABLATION     uterine   SHOULDER SURGERY     TUBAL LIGATION  2003   Social History   Occupational History   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

## 2023-08-18 NOTE — Addendum Note (Signed)
 Addended by: PETER FRIEZE B on: 08/18/2023 08:30 AM   Modules accepted: Orders

## 2023-08-21 ENCOUNTER — Encounter: Payer: Self-pay | Admitting: Physician Assistant

## 2023-08-22 ENCOUNTER — Encounter: Admitting: Family

## 2023-08-22 NOTE — Progress Notes (Signed)
 Erroneous encounter-disregard

## 2023-08-24 ENCOUNTER — Encounter (INDEPENDENT_AMBULATORY_CARE_PROVIDER_SITE_OTHER): Payer: Self-pay

## 2023-08-24 ENCOUNTER — Other Ambulatory Visit

## 2023-08-31 ENCOUNTER — Telehealth: Payer: Self-pay | Admitting: Physician Assistant

## 2023-08-31 NOTE — Telephone Encounter (Signed)
 Patient called and said that no one has contacted her yet. CB#[phone]

## 2023-08-31 NOTE — Telephone Encounter (Signed)
 Called patient. Referral is still being processed, noted on 8/21. Advised it takes 3-5 business days once referral is processed to be contacted and she is still within that time frame.

## 2023-09-18 ENCOUNTER — Institutional Professional Consult (permissible substitution): Admitting: Family Medicine

## 2023-10-05 ENCOUNTER — Other Ambulatory Visit: Payer: Self-pay | Admitting: Family

## 2023-10-05 DIAGNOSIS — Z1231 Encounter for screening mammogram for malignant neoplasm of breast: Secondary | ICD-10-CM

## 2023-10-09 ENCOUNTER — Ambulatory Visit: Admitting: Bariatrics

## 2023-10-11 ENCOUNTER — Ambulatory Visit

## 2023-10-20 ENCOUNTER — Observation Stay (HOSPITAL_COMMUNITY)

## 2023-10-20 ENCOUNTER — Observation Stay (HOSPITAL_COMMUNITY)
Admission: EM | Admit: 2023-10-20 | Discharge: 2023-10-20 | Disposition: A | Discharge status: Home / Self Care | Attending: Internal Medicine | Admitting: Internal Medicine

## 2023-10-20 ENCOUNTER — Other Ambulatory Visit: Payer: Self-pay

## 2023-10-20 ENCOUNTER — Encounter (HOSPITAL_COMMUNITY): Payer: Self-pay

## 2023-10-20 ENCOUNTER — Emergency Department (HOSPITAL_COMMUNITY)

## 2023-10-20 DIAGNOSIS — R7989 Other specified abnormal findings of blood chemistry: Principal | ICD-10-CM | POA: Insufficient documentation

## 2023-10-20 DIAGNOSIS — Z86711 Personal history of pulmonary embolism: Secondary | ICD-10-CM | POA: Insufficient documentation

## 2023-10-20 DIAGNOSIS — J45909 Unspecified asthma, uncomplicated: Secondary | ICD-10-CM | POA: Insufficient documentation

## 2023-10-20 DIAGNOSIS — E1165 Type 2 diabetes mellitus with hyperglycemia: Secondary | ICD-10-CM | POA: Insufficient documentation

## 2023-10-20 DIAGNOSIS — Z794 Long term (current) use of insulin: Secondary | ICD-10-CM | POA: Insufficient documentation

## 2023-10-20 DIAGNOSIS — R739 Hyperglycemia, unspecified: Secondary | ICD-10-CM

## 2023-10-20 DIAGNOSIS — I10 Essential (primary) hypertension: Secondary | ICD-10-CM | POA: Insufficient documentation

## 2023-10-20 DIAGNOSIS — R55 Syncope and collapse: Principal | ICD-10-CM | POA: Diagnosis present

## 2023-10-20 LAB — COMPREHENSIVE METABOLIC PANEL WITH GFR
ALT: 21 U/L (ref 0–44)
AST: 24 U/L (ref 15–41)
Albumin: 4 g/dL (ref 3.5–5.0)
Alkaline Phosphatase: 112 U/L (ref 38–126)
Anion gap: 12 (ref 5–15)
BUN: 11 mg/dL (ref 6–20)
CO2: 28 mmol/L (ref 22–32)
Calcium: 9 mg/dL (ref 8.9–10.3)
Chloride: 96 mmol/L — ABNORMAL LOW (ref 98–111)
Creatinine, Ser: 0.86 mg/dL (ref 0.44–1.00)
GFR, Estimated: >60 mL/min (ref >60)
Glucose, Bld: 428 mg/dL — ABNORMAL HIGH (ref 70–99)
Potassium: 3.8 mmol/L (ref 3.5–5.1)
Sodium: 135 mmol/L (ref 135–145)
Total Bilirubin: 0.5 mg/dL (ref 0.0–1.2)
Total Protein: 8 g/dL (ref 6.5–8.1)

## 2023-10-20 LAB — CBC WITH DIFFERENTIAL/PLATELET
Abs Immature Granulocytes: 0.01 K/uL (ref 0.00–0.07)
Basophils Absolute: 0 K/uL (ref 0.0–0.1)
Basophils Relative: 0 %
Eosinophils Absolute: 0 K/uL (ref 0.0–0.5)
Eosinophils Relative: 1 %
HCT: 43.4 % (ref 36.0–46.0)
Hemoglobin: 13.1 g/dL (ref 12.0–15.0)
Immature Granulocytes: 0 %
Lymphocytes Relative: 49 %
Lymphs Abs: 2.6 K/uL (ref 0.7–4.0)
MCH: 27.5 pg (ref 26.0–34.0)
MCHC: 30.2 g/dL (ref 30.0–36.0)
MCV: 91.2 fL (ref 80.0–100.0)
Monocytes Absolute: 0.3 K/uL (ref 0.1–1.0)
Monocytes Relative: 6 %
Neutro Abs: 2.3 K/uL (ref 1.7–7.7)
Neutrophils Relative %: 44 %
Platelets: 314 K/uL (ref 150–400)
RBC: 4.76 MIL/uL (ref 3.87–5.11)
RDW: 13.3 % (ref 11.5–15.5)
WBC: 5.3 K/uL (ref 4.0–10.5)
nRBC: 0 % (ref 0.0–0.2)

## 2023-10-20 LAB — URINALYSIS, W/ REFLEX TO CULTURE (INFECTION SUSPECTED)
Bacteria, UA: NONE SEEN
Bilirubin Urine: NEGATIVE
Glucose, UA: >=500 mg/dL — AB
Hgb urine dipstick: NEGATIVE
Ketones, ur: NEGATIVE mg/dL
Leukocytes,Ua: NEGATIVE
Nitrite: NEGATIVE
Protein, ur: NEGATIVE mg/dL
Specific Gravity, Urine: 1.031 — ABNORMAL HIGH (ref 1.005–1.030)
pH: 5 (ref 5.0–8.0)

## 2023-10-20 LAB — BLOOD GAS, VENOUS
Acid-Base Excess: 6.8 mmol/L — ABNORMAL HIGH (ref 0.0–2.0)
Bicarbonate: 33.7 mmol/L — ABNORMAL HIGH (ref 20.0–28.0)
O2 Saturation: 40.8 %
Patient temperature: 37
pCO2, Ven: 57 mmHg (ref 44–60)
pH, Ven: 7.38 (ref 7.25–7.43)
pO2, Ven: <31 mmHg — CL (ref 32–45)

## 2023-10-20 LAB — CREATININE, SERUM
Creatinine, Ser: 0.72 mg/dL (ref 0.44–1.00)
GFR, Estimated: >60 mL/min (ref >60)

## 2023-10-20 LAB — CBG MONITORING, ED
Glucose-Capillary: 408 mg/dL — ABNORMAL HIGH (ref 70–99)
Glucose-Capillary: 292 mg/dL — ABNORMAL HIGH (ref 70–99)
Glucose-Capillary: 201 mg/dL — ABNORMAL HIGH (ref 70–99)
Glucose-Capillary: 146 mg/dL — ABNORMAL HIGH (ref 70–99)

## 2023-10-20 LAB — TROPONIN T, HIGH SENSITIVITY
Troponin T High Sensitivity: 42 ng/L — ABNORMAL HIGH (ref 0–19)
Troponin T High Sensitivity: 39 ng/L — ABNORMAL HIGH (ref 0–19)
Troponin T High Sensitivity: 32 ng/L — ABNORMAL HIGH (ref 0–19)

## 2023-10-20 LAB — MAGNESIUM: Magnesium: 2.1 mg/dL (ref 1.7–2.4)

## 2023-10-20 LAB — LIPASE, BLOOD: Lipase: 24 U/L (ref 11–51)

## 2023-10-20 MED ORDER — INSULIN ASPART 100 UNIT/ML IJ SOLN
8.0000 [IU] | Freq: Once | INTRAMUSCULAR | Status: AC
  Administered 2023-10-20: 8 [IU] via SUBCUTANEOUS
  Filled 2023-10-20: qty 0.08

## 2023-10-20 MED ORDER — LOSARTAN POTASSIUM 50 MG PO TABS
50.0000 mg | ORAL_TABLET | Freq: Every day | ORAL | Status: DC
  Administered 2023-10-20: 50 mg via ORAL
  Filled 2023-10-20: qty 1

## 2023-10-20 MED ORDER — ENOXAPARIN SODIUM 80 MG/0.8ML IJ SOSY
0.5000 mg/kg | PREFILLED_SYRINGE | INTRAMUSCULAR | Status: DC
  Administered 2023-10-20: 80 mg via SUBCUTANEOUS
  Filled 2023-10-20: qty 0.8

## 2023-10-20 MED ORDER — LOSARTAN POTASSIUM-HCTZ 50-12.5 MG PO TABS: 1.0000 | ORAL_TABLET | Freq: Every day | ORAL | Status: DC

## 2023-10-20 MED ORDER — SODIUM CHLORIDE 0.9% FLUSH
3.0000 mL | Freq: Two times a day (BID) | INTRAVENOUS | Status: DC
  Administered 2023-10-20: 3 mL via INTRAVENOUS

## 2023-10-20 MED ORDER — HYDROCHLOROTHIAZIDE 12.5 MG PO TABS
12.5000 mg | ORAL_TABLET | Freq: Every day | ORAL | Status: DC
  Administered 2023-10-20: 12.5 mg via ORAL
  Filled 2023-10-20: qty 1

## 2023-10-20 MED ORDER — ACETAMINOPHEN 325 MG PO TABS: 650.0000 mg | ORAL_TABLET | Freq: Four times a day (QID) | ORAL | Status: DC | PRN

## 2023-10-20 MED ORDER — INSULIN ASPART 100 UNIT/ML IJ SOLN
0.0000 [IU] | Freq: Three times a day (TID) | INTRAMUSCULAR | Status: DC
  Administered 2023-10-20: 3 [IU] via SUBCUTANEOUS
  Filled 2023-10-20: qty 0.2

## 2023-10-20 MED ORDER — INSULIN ASPART 100 UNIT/ML IJ SOLN
0.0000 [IU] | Freq: Every day | INTRAMUSCULAR | Status: DC
  Filled 2023-10-20: qty 0.05

## 2023-10-20 MED ORDER — INSULIN ASPART 100 UNIT/ML IJ SOLN
0.0000 [IU] | Freq: Three times a day (TID) | INTRAMUSCULAR | Status: DC
  Administered 2023-10-20: 5 [IU] via SUBCUTANEOUS
  Filled 2023-10-20: qty 0.15

## 2023-10-20 MED ORDER — ASPIRIN 81 MG PO CHEW
324.0000 mg | CHEWABLE_TABLET | Freq: Once | ORAL | Status: AC
  Administered 2023-10-20: 324 mg via ORAL
  Filled 2023-10-20: qty 4

## 2023-10-20 MED ORDER — INSULIN GLARGINE-YFGN 100 UNIT/ML ~~LOC~~ SOLN
10.0000 [IU] | Freq: Every day | SUBCUTANEOUS | Status: DC
  Administered 2023-10-20: 10 [IU] via SUBCUTANEOUS
  Filled 2023-10-20: qty 0.1

## 2023-10-20 MED ORDER — HYDROXYZINE HCL 25 MG PO TABS
25.0000 mg | ORAL_TABLET | Freq: Once | ORAL | Status: AC
  Administered 2023-10-20: 25 mg via ORAL
  Filled 2023-10-20: qty 1

## 2023-10-20 MED ORDER — DULOXETINE HCL 30 MG PO CPEP
30.0000 mg | ORAL_CAPSULE | Freq: Every day | ORAL | Status: DC
  Administered 2023-10-20: 30 mg via ORAL
  Filled 2023-10-20: qty 1

## 2023-10-20 MED ORDER — SODIUM CHLORIDE 0.9 % IV BOLUS
1000.0000 mL | Freq: Once | INTRAVENOUS | Status: AC
  Administered 2023-10-20: 1000 mL via INTRAVENOUS

## 2023-10-20 MED ORDER — ACETAMINOPHEN 650 MG RE SUPP: 650.0000 mg | Freq: Four times a day (QID) | RECTAL | Status: DC | PRN

## 2023-10-20 MED ORDER — POTASSIUM CHLORIDE CRYS ER 20 MEQ PO TBCR
40.0000 meq | EXTENDED_RELEASE_TABLET | Freq: Once | ORAL | Status: AC
  Administered 2023-10-20: 40 meq via ORAL
  Filled 2023-10-20: qty 2

## 2023-10-20 MED ORDER — ONDANSETRON HCL 4 MG/2ML IJ SOLN
4.0000 mg | Freq: Four times a day (QID) | INTRAMUSCULAR | Status: DC | PRN
  Administered 2023-10-20: 4 mg via INTRAVENOUS
  Filled 2023-10-20: qty 2

## 2023-10-20 MED ORDER — MELATONIN 3 MG PO TABS: 3.0000 mg | ORAL_TABLET | Freq: Every evening | ORAL | Status: DC | PRN

## 2023-10-20 MED ORDER — MIRABEGRON ER 25 MG PO TB24
25.0000 mg | ORAL_TABLET | Freq: Every day | ORAL | Status: DC
  Administered 2023-10-20: 25 mg via ORAL
  Filled 2023-10-20: qty 1

## 2023-10-20 NOTE — ED Provider Notes (Signed)
 [facility] AT [facility] Provider Note  [name]: [id number] Arrival date & time: 10/20/23 0230  Chief Complaint(s) Near Syncope  HPI Jaime Castaneda is a 53 y.o. female who presented to the emergency department after near syncopal/syncopal episode while at work.  Patient reports that they had just finish doing rounds and she had sat down when the symptoms began.  Reports tunnel vision and hearing distortion.  Patient believes she was semiconscious during the episode but her coworkers reported she had completely passed out.  Episode reportedly lasted less than a minute.  When she came to, she reported left-sided chest discomfort described as pressure that was nonradiating.  It subsided after 5 to 10 minutes.  She has not been ambulatory since the episode but denies any exertional pain prior to that.  Patient has chronic shortness of breath and denies any acute exacerbation.  No recent fevers or infections.  No cough or congestion.  No vomiting or diarrhea.  No bloody bowel movements.  Reports she is currently being treated for urinary tract infection   Near Syncope    Past Medical History Past Medical History:  Diagnosis Date   Anemia    Anxiety    Arthritis of knee    bilateral   Asthma    Back pain    CFS (chronic fatigue syndrome)    Constipation    Depression    Diabetes mellitus without complication (HCC)    Dyspnea    Fibroid    GERD (gastroesophageal reflux disease)    Joint pain    Lower extremity edema    Metabolic syndrome    Migraine    Obesity    Prediabetes    Pulmonary embolus (HCC)    Scleritis    TMJ arthralgia    Vitamin D deficiency    Patient Active Problem List   Diagnosis Date Noted   Syncope 10/20/2023   Dizzy spells 01/26/2023   History of panic attacks 01/26/2023   TMJ (temporomandibular joint disorder) 01/26/2023   DOE (dyspnea on exertion) 01/26/2023   Primary osteoarthritis of both knees 10/08/2021   Chronic  bilateral low back pain with bilateral sciatica 11/16/2020   Sleep-disordered breathing 11/16/2020   Primary insomnia 06/22/2020   Bleeding 03/24/2020   Gastroesophageal reflux disease without esophagitis 03/16/2020   Primary hypertension 03/16/2020   Snoring 03/16/2020   Vitamin B12 deficiency 03/16/2020   History of pulmonary embolism 03/02/2020   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 without macular edema, without long-term current use of insulin  (HCC) 03/02/2020   Menorrhagia 02/17/2020   Pulmonary emboli (HCC) 02/17/2020   Other chest pain    Shortness of breath    Vaginal bleeding    Hypertensive retinopathy of both eyes 09/26/2017   Nuclear sclerotic cataract of both eyes 09/26/2017   Autoimmune deficiency syndrome 09/16/2016   H/O Bell's palsy 09/16/2016   Scleritis 09/16/2016   Morbid obesity (HCC) 06/27/2015   Severe headache 06/27/2015   Vision changes 06/27/2015   Metabolic syndrome 06/27/2015   Migraine 06/27/2015   Mild intermittent asthma, uncomplicated 04/13/2015   Adjustment disorder with anxiety 02/14/2014   BV (bacterial vaginosis) 02/14/2014   Iron deficiency anemia 10/23/2012   Adjustment insomnia 09/26/2012   Candidal vulvovaginitis 06/09/2012   Elevated blood pressure reading 10/18/2010   Home Medication(s) Prior to Admission medications   Medication Sig Start Date End Date Taking? Authorizing Provider  albuterol  (VENTOLIN  HFA) 108 (90 Base) MCG/ACT inhaler Inhale  2 puffs into the lungs every 6 (six) hours as needed for wheezing or shortness of breath.    [provider]  amoxicillin -clavulanate (AUGMENTIN ) 875-125 MG tablet Take 1 tablet by mouth every 12 (twelve) hours. 01/26/23   Billy Asberry FALCON, PA-C  clobetasol (TEMOVATE) 0.05 % external solution Apply 1 Application topically 2 (two) times daily. 12/21/21   [provider]  DULoxetine  (CYMBALTA ) 30 MG capsule Take 1 capsule (30 mg  total) by mouth daily. 07/26/23 10/24/23  Lorren Greig PARAS, NP  fluticasone  (FLONASE ) 50 MCG/ACT nasal spray Place 1 spray into both nostrils 2 (two) times daily. 06/22/20   [provider]  glipiZIDE  (GLUCOTROL  XL) 10 MG 24 hr tablet Take 1 tablet (10 mg total) by mouth daily. 07/26/23 10/24/23  Lorren Greig PARAS, NP  hydrOXYzine  (ATARAX ) 25 MG tablet Take 25 mg by mouth every 8 (eight) hours as needed for anxiety. 04/26/21   [provider]  ibuprofen  (ADVIL ) 200 MG tablet Take 200 mg by mouth every 6 (six) hours as needed. 02/14/20   [provider]  insulin  glargine (LANTUS ) 100 UNIT/ML Solostar Pen Inject 10 Units into the skin daily. 07/26/23   Lorren Greig PARAS, NP  losartan -hydrochlorothiazide (HYZAAR) 50-12.5 MG tablet Take 1 tablet by mouth daily. 07/26/23 10/24/23  Lorren Greig PARAS, NP  mupirocin  cream (BACTROBAN ) 2 % Apply 1 Application topically 2 (two) times daily. 01/26/23   Billy Asberry FALCON, PA-C  naproxen  (NAPROSYN ) 500 MG tablet Take 1 tablet (500 mg total) by mouth 2 (two) times daily with a meal. 07/26/23 10/24/23  Lorren Greig PARAS, NP  norethindrone (AYGESTIN) 5 MG tablet Take 10 mg by mouth daily.    [provider]  ondansetron  (ZOFRAN  ODT) 4 MG disintegrating tablet Take 1 tablet (4 mg total) by mouth every 8 (eight) hours as needed for nausea or vomiting. 04/18/20   Roselyn Carlin NOVAK, MD  Semaglutide,0.25 or 0.5MG /DOS, (OZEMPIC, 0.25 OR 0.5 MG/DOSE,) 2 MG/3ML [facility]  09/16/22   [provider]  triamcinolone  acetonide (KENALOG -40) 40 MG/ML injection Inject 40 mg into the muscle once. Patient not taking: Reported on 07/26/2023 05/04/21   [provider]                                                                                                                                    Allergies Ferric carboxymaltose, Sulfa  antibiotics, Cephalexin , and Moxifloxacin  Review of Systems Review of Systems  Cardiovascular:  Positive for near-syncope.    As noted in HPI  Physical Exam Vital Signs  I have reviewed the triage vital signs BP 136/87   Pulse 85   Temp 98.2 F (36.8 C) (Oral)   Resp 18   Ht 5' 7 (1.702 m)   Wt (!) 158.5 kg   SpO2 97%   BMI 54.73 kg/m   Physical Exam Vitals reviewed.  Constitutional:      General: She is not  in acute distress.    Appearance: She is well-developed. She is morbidly obese. She is not diaphoretic.  HENT:     Head: Normocephalic and atraumatic.     Nose: Nose normal.  Eyes:     General: No scleral icterus.       Right eye: No discharge.        Left eye: No discharge.     Conjunctiva/sclera: Conjunctivae normal.     Pupils: Pupils are equal, round, and reactive to light.  Cardiovascular:     Rate and Rhythm: Normal rate and regular rhythm.     Heart sounds: No murmur heard.    No friction rub. No gallop.  Pulmonary:     Effort: Pulmonary effort is normal. No respiratory distress.     Breath sounds: Normal breath sounds. No stridor. No rales.  Abdominal:     General: There is no distension.     Palpations: Abdomen is soft.     Tenderness: There is no abdominal tenderness.  Musculoskeletal:        General: No tenderness.     Cervical back: Normal range of motion and neck supple.  Skin:    General: Skin is warm and dry.     Findings: No erythema or rash.  Neurological:     Mental Status: She is alert and oriented to person, place, and time.     ED Results and Treatments Labs (all labs ordered are listed, but only abnormal results are displayed) Labs Reviewed  COMPREHENSIVE METABOLIC PANEL WITH GFR - Abnormal; Notable for the following components:      Result Value   Chloride 96 (*)    Glucose, Bld 428 (*)    All other components within normal limits  BLOOD GAS, VENOUS - Abnormal; Notable for the following components:   pO2, Ven <31 (*)    Bicarbonate 33.7 (*)    Acid-Base Excess 6.8 (*)    All other components within normal limits  URINALYSIS, W/ REFLEX TO  CULTURE (INFECTION SUSPECTED) - Abnormal; Notable for the following components:   APPearance HAZY (*)    Specific Gravity, Urine 1.031 (*)    Glucose, UA >=500 (*)    All other components within normal limits  CBG MONITORING, ED - Abnormal; Notable for the following components:   Glucose-Capillary 408 (*)    All other components within normal limits  TROPONIN T, HIGH SENSITIVITY - Abnormal; Notable for the following components:   Troponin T High Sensitivity 42 (*)    All other components within normal limits  CBC WITH DIFFERENTIAL/PLATELET  MAGNESIUM  LIPASE, BLOOD  TROPONIN T, HIGH SENSITIVITY                                                                                                                         EKG  EKG Interpretation Date/Time:  Friday October 20 2023 03:01:16 EDT Ventricular Rate:  88 PR Interval:  166 QRS Duration:  107 QT Interval:  392 QTC  Calculation: 475 R Axis:   -18  Text Interpretation: Sinus rhythm Probable left atrial enlargement Low voltage, precordial leads RSR' in V1 or V2, right VCD or RVH Left ventricular hypertrophy Anterior Q waves, possibly due to LVH No significant change was found Confirmed by Trine Likes [phone]) on 10/20/2023 3:22:20 AM       Radiology DG Chest 2 View Result Date: 10/20/2023 EXAM: 2 VIEW(S) XRAY OF THE CHEST 10/20/2023 03:18:00 AM COMPARISON: 07/24/2023 CLINICAL HISTORY: near syncope. near syncope FINDINGS: LUNGS AND PLEURA: Mild eventration of the right hemidiaphragm. No focal pulmonary opacity. No pulmonary edema. No pleural effusion. No pneumothorax. HEART AND MEDIASTINUM: No acute abnormality of the cardiac and mediastinal silhouettes. BONES AND SOFT TISSUES: Mild degenerative changes of the mid thoracic spine. No acute osseous abnormality. IMPRESSION: 1. No acute cardiopulmonary process. Electronically signed by: Pinkie Pebbles MD 10/20/2023 03:21 AM EDT RP Workstation: [id number]    Medications Ordered in  ED Medications  acetaminophen  (TYLENOL ) tablet 650 mg (has no administration in time range)    Or  acetaminophen  (TYLENOL ) suppository 650 mg (has no administration in time range)  melatonin tablet 3 mg (has no administration in time range)  ondansetron  (ZOFRAN ) injection 4 mg (has no administration in time range)  potassium chloride SA (KLOR-CON M) CR tablet 40 mEq (has no administration in time range)  insulin  aspart (novoLOG) injection 0-15 Units (has no administration in time range)  insulin  aspart (novoLOG) injection 0-5 Units (has no administration in time range)  insulin  aspart (novoLOG) injection 8 Units (has no administration in time range)  sodium chloride  0.9 % bolus 1,000 mL (1,000 mLs Intravenous New Bag/Given 10/20/23 0303)  aspirin  chewable tablet 324 mg (324 mg Oral Given 10/20/23 0410)  hydrOXYzine  (ATARAX ) tablet 25 mg (25 mg Oral Given 10/20/23 0410)   Procedures Procedures  (including critical care time) Medical Decision Making / ED Course   Medical Decision Making Amount and/or Complexity of Data Reviewed Labs: ordered. Decision-making details documented in ED Course. Radiology: ordered and independent interpretation performed. Decision-making details documented in ED Course. ECG/medicine tests: ordered and independent interpretation performed. Decision-making details documented in ED Course.  Risk OTC drugs. Prescription drug management. Decision regarding hospitalization.    Near syncope/syncope episode.  Differential diagnosis considered.  Workup below.  EKG without acute ischemic changes, dysrhythmias or blocks.  Initial troponin slightly elevated at 42.  No prior for comparison.  Heart score of 5.  Elevated troponin may be chronic leak due to elevated blood pressure however will need to admit for ACS rule out.  She was given 324 mg of aspirin .  CBC without leukocytosis or anemia. CMP without significant electrolyte derangements or renal sufficiency.   Hyperglycemia without DKA. UA without evidence of infection. Chest x-ray without evidence of pneumonia, pneumothorax, pulmonary edema pleural effusions.  No mediastinal abnormalities.  Doubt aortic dissection, esophageal perforation or pulmonary embolism.  Discussed with Dr. Marcene from Hospitalist team who will admit    Final Clinical Impression(s) / ED Diagnoses Final diagnoses:  Elevated troponin  Syncope, unspecified syncope type  Hyperglycemia    This chart was dictated using voice recognition software.  Despite best efforts to proofread,  errors can occur which can change the documentation meaning.    Trine Likes Moder, MD 10/20/23 [phone]

## 2023-10-20 NOTE — H&P (Signed)
 History and Physical    Jaime Castaneda FMW:[id number] DOB: [birth year] DOA: 10/20/2023  PCP: Lorren Greig PARAS, NP   Chief Complaint: Syncope  HPI: Jaime Castaneda is a 53 y.o. female with medical history significant of poorly controlled diabetes type 2 insulin -dependent, hypertension with reported episode of syncope with notable prodrome.  Unclear if patient truly had full loss of consciousness or simply became lightheaded -after further discussion with patient it appears she has been eating and drinking poorly as well as sleeping poorly with increased stress.  Given her uncontrolled glucose here it is likely she had an episode of hypoglycemia versus orthostatic hypotension given poor p.o. intake and dehydration.  At this time given negative imaging labs and telemetry she is otherwise requesting discharge home which is certainly reasonable.  Patient had minimally elevated troponin, peak at 42 but downtrending appropriately.  EKG and telemetry are reassuring, patient's only real abnormality while here is profound hyperglycemia which we discussed at length given patient's poor p.o. intake she likely has rebounding hyperglycemia with a peak at 408.  Lengthy discussion in regards to patient's improved lifestyle regimen dietary intake.  She is otherwise stable and agreeable for discharge home, medication changes as below otherwise recommend seeing PCP in the next 1 to 2 weeks, which she already has scheduled.  Patient would likely benefit from echocardiogram versus stress test in the near future to rule out any cardiac abnormalities but again given her prodrome this appears to be consistent with hypoglycemia. Family bedside agrees patient appears back to baseline no further episodes or symptoms and agreeable for discharge.  Review of Systems: As per HPI denies nausea vomiting diarrhea constipation headache fevers chills chest pain shortness of breath.   Assessment/Plan Principal Problem:   Syncope    Syncope of unclear etiology Cannot rule out hypoglycemia versus orthostatic event -As above reassuring labs/physical exam and evaluation she is otherwise stable and agreeable for discharge home. - Discussed potential further workup with PCP including but not limited to echocardiogram, stress test or Holter monitor if recurrent episodes.  Code Status: Full Family Communication: At bedside  Dispo: The patient is from: Home              Anticipated d/c is to: Home  Consultants:  None  Procedures:  None   Past Medical History:  Diagnosis Date   Anemia    Anxiety    Arthritis of knee    bilateral   Asthma    Back pain    CFS (chronic fatigue syndrome)    Constipation    Depression    Diabetes mellitus without complication (HCC)    Dyspnea    Fibroid    GERD (gastroesophageal reflux disease)    Joint pain    Lower extremity edema    Metabolic syndrome    Migraine    Obesity    Prediabetes    Pulmonary embolus (HCC)    Scleritis    TMJ arthralgia    Vitamin D deficiency     Past Surgical History:  Procedure Laterality Date   ABLATION     uterine   SHOULDER SURGERY     TUBAL LIGATION  2003     reports that she has never smoked. She has never used smokeless tobacco. She reports that she does not drink alcohol and does not use drugs.  Allergies  Allergen Reactions   Ferric Carboxymaltose     Other Reaction(s): Other (See Comments)  Severe disabling leg cramps   Sulfa  Antibiotics  Cephalexin  Nausea And Vomiting and Nausea Only    Other Reaction(s): GI Intolerance   Moxifloxacin Nausea And Vomiting and Nausea Only    Other Reaction(s): GI Intolerance    Family History  Problem Relation Age of Onset   Diabetes Mother    Hypertension Mother    Heart disease Mother    Depression Mother    Anxiety disorder Mother    Obesity Mother    Heart disease Other    Thyroid disease Other    Bone cancer Other    High blood pressure Other    Migraines Neg Hx      Prior to Admission medications   Medication Sig Start Date End Date Taking? Authorizing Provider  albuterol  (VENTOLIN  HFA) 108 (90 Base) MCG/ACT inhaler Inhale 2 puffs into the lungs every 6 (six) hours as needed for wheezing or shortness of breath.   Yes [provider]  ciprofloxacin (CIPRO) 500 MG tablet Take 500 mg by mouth 2 (two) times daily. 10/14/23  Yes [provider]  DULoxetine  (CYMBALTA ) 30 MG capsule Take 1 capsule (30 mg total) by mouth daily. 07/26/23 10/24/23 Yes Lorren, Amy J, NP  fluticasone  (FLONASE ) 50 MCG/ACT nasal spray Place 1 spray into both nostrils as needed for allergies. 06/22/20  Yes [provider]  glipiZIDE  (GLUCOTROL  XL) 10 MG 24 hr tablet Take 1 tablet (10 mg total) by mouth daily. 07/26/23 10/24/23 Yes Lorren, Amy J, NP  hydrOXYzine  (ATARAX ) 25 MG tablet Take 25 mg by mouth every 8 (eight) hours as needed for anxiety. 04/26/21  Yes [provider]  ibuprofen  (ADVIL ) 200 MG tablet Take 200 mg by mouth every 6 (six) hours as needed. 02/14/20  Yes [provider]  insulin  glargine (LANTUS ) 100 UNIT/ML Solostar Pen Inject 10 Units into the skin daily. 07/26/23  Yes Lorren, Amy J, NP  losartan -hydrochlorothiazide (HYZAAR) 50-12.5 MG tablet Take 1 tablet by mouth daily. 07/26/23 10/24/23 Yes Stephens, Amy J, NP  MYRBETRIQ 25 MG TB24 tablet Take 25 mg by mouth daily. 10/06/23  Yes [provider]  naproxen  (NAPROSYN ) 500 MG tablet Take 1 tablet (500 mg total) by mouth 2 (two) times daily with a meal. 07/26/23 10/24/23 Yes Lorren, Amy J, NP  ondansetron  (ZOFRAN  ODT) 4 MG disintegrating tablet Take 1 tablet (4 mg total) by mouth every 8 (eight) hours as needed for nausea or vomiting. 04/18/20  Yes Roselyn Carlin NOVAK, MD  clobetasol (TEMOVATE) 0.05 % external solution Apply 1 Application topically 2 (two) times daily. Patient not taking: Reported on 10/20/2023 12/21/21   [provider]  mupirocin  cream  (BACTROBAN ) 2 % Apply 1 Application topically 2 (two) times daily. Patient not taking: Reported on 10/20/2023 01/26/23   Billy Asberry FALCON, PA-C  norethindrone (AYGESTIN) 5 MG tablet Take 10 mg by mouth daily. Patient not taking: Reported on 10/20/2023    [provider]    Physical Exam: Vitals:   10/20/23 0245 10/20/23 0300 10/20/23 0315 10/20/23 0615  BP:  (!) 145/99 136/87 116/82  Pulse:  89 85 77  Resp:  18  20  Temp: 98.2 F (36.8 C)     TempSrc: Oral     SpO2:  99% 97% 100%  Weight:      Height:        Constitutional: NAD, calm, comfortable Vitals:   10/20/23 0245 10/20/23 0300 10/20/23 0315 10/20/23 0615  BP:  (!) 145/99 136/87 116/82  Pulse:  89 85 77  Resp:  18  20  Temp: 98.2 F (36.8 C)     TempSrc: Oral     SpO2:  99% 97% 100%  Weight:      Height:       General:  Pleasantly resting in bed, No acute distress. HEENT:  Normocephalic atraumatic.  Sclerae nonicteric, noninjected.  Extraocular movements intact bilaterally. Neck:  Without mass or deformity.  Trachea is midline. Lungs:  Clear to auscultate bilaterally without rhonchi, wheeze, or rales. Heart:  Regular rate and rhythm.  Without murmurs, rubs, or gallops. Abdomen:  Soft, nontender, nondistended.  Without guarding or rebound. Extremities: Without cyanosis, clubbing, edema, or obvious deformity. Vascular:  Dorsalis pedis and posterior tibial pulses palpable bilaterally. Skin:  Warm and dry, no erythema, no ulcerations.  Labs on Admission: I have personally reviewed following labs and imaging studies  CBC: Recent Labs  Lab 10/20/23 0258  WBC 5.3  NEUTROABS 2.3  HGB 13.1  HCT 43.4  MCV 91.2  PLT 314   Basic Metabolic Panel: Recent Labs  Lab 10/20/23 0042 10/20/23 0258  NA  --  135  K  --  3.8  CL  --  96*  CO2  --  28  GLUCOSE  --  428*  BUN  --  11  CREATININE  --  0.86  CALCIUM   --  9.0  MG 2.1  --    GFR: Estimated Creatinine Clearance: 118.5 mL/min (by C-G formula  based on SCr of 0.86 mg/dL). Liver Function Tests: Recent Labs  Lab 10/20/23 0258  AST 24  ALT 21  ALKPHOS 112  BILITOT 0.5  PROT 8.0  ALBUMIN 4.0   Recent Labs  Lab 10/20/23 0042  LIPASE 24   No results for input(s): AMMONIA in the last 168 hours. Coagulation Profile: No results for input(s): INR, PROTIME in the last 168 hours. Cardiac Enzymes: No results for input(s): CKTOTAL, CKMB, CKMBINDEX, TROPONINI in the last 168 hours. BNP (last 3 results) No results for input(s): PROBNP in the last 8760 hours. HbA1C: No results for input(s): HGBA1C in the last 72 hours. CBG: Recent Labs  Lab 10/20/23 0252 10/20/23 0643  GLUCAP 408* 292*   Urine analysis:    Component Value Date/Time   COLORURINE YELLOW 10/20/2023 0256   APPEARANCEUR HAZY (A) 10/20/2023 0256   LABSPEC 1.031 (H) 10/20/2023 0256   PHURINE 5.0 10/20/2023 0256   GLUCOSEU >=500 (A) 10/20/2023 0256   HGBUR NEGATIVE 10/20/2023 0256   BILIRUBINUR NEGATIVE 10/20/2023 0256   BILIRUBINUR negative 07/24/2023 0922   KETONESUR NEGATIVE 10/20/2023 0256   PROTEINUR NEGATIVE 10/20/2023 0256   UROBILINOGEN 0.2 07/24/2023 0922   UROBILINOGEN 0.2 11/03/2014 1319   NITRITE NEGATIVE 10/20/2023 0256   LEUKOCYTESUR NEGATIVE 10/20/2023 0256    Radiological Exams on Admission: DG Chest 2 View Result Date: 10/20/2023 EXAM: 2 VIEW(S) XRAY OF THE CHEST 10/20/2023 03:18:00 AM COMPARISON: 07/24/2023 CLINICAL HISTORY: near syncope. near syncope FINDINGS: LUNGS AND PLEURA: Mild eventration of the right hemidiaphragm. No focal pulmonary opacity. No pulmonary edema. No pleural effusion. No pneumothorax. HEART AND MEDIASTINUM: No acute abnormality of the cardiac and mediastinal silhouettes. BONES AND SOFT TISSUES: Mild degenerative changes of the mid thoracic spine. No acute osseous abnormality. IMPRESSION: 1. No acute cardiopulmonary process. Electronically signed by: Pinkie Pebbles MD 10/20/2023 03:21 AM EDT RP  Workstation: [id number]    EKG: Independently reviewed.  Without acute ST elevation   Elsie JAYSON Montclair DO [facility] For contact please use secure messenger on Epic  If 7PM-7AM, please contact night-coverage located on  [email or web address]   10/20/2023, 8:02 AM

## 2023-10-20 NOTE — Progress Notes (Signed)
  Carryover admission to the Day Admitter.  I discussed this case with the EDP, Dr. Trine.  Per these discussions:   This is a 53 year old female with history of essential hypertension, poorly controlled type 2 diabetes mellitus with most recent hemoglobin A1c 10.8% when checked in July 2025, who is being admitted for a single episode of syncope associated with prodrome after presenting to the ED complaining of 1 episode of loss of consciousness earlier today.   The patient reports that job entails a significant amount of and that she had been on her feet ambulating for much of the morning, and completely asymptomatic fashion, noting no exertional chest pain, shortness of breath, palpitations, diaphoresis, nausea, or vomiting.  However, upon sitting down, she noted development of dizziness, lightheadedness, subjective sensation of impending loss of consciousness, in the absence of any chest pain, before losing consciousness, as witnessed by her coworker, who confirmed no evidence of tonic-clonic activity.  No associated any acute focal weakness.  Upon awakening from this episode of loss consciousness, the patient noted some transient left-sided chest discomfort, which spontaneously resolved, without any ensuing recurrence.  In the ED this evening, EKGs report showed no evidence of acute ischemic changes, including no evidence of STEMI.  Chest x-ray shows no evidence of acute cardiopulmonary process, including no evidence of pneumothorax.  Initial high-sensitivity troponin T was noted to be 42, without any previous high-sensitivity troponin T values available for point comparison.  Repeat troponin currently pending.  CMP was also notable for potassium 3.8, bicarbonate 28, anion gap 12, glucose 428.  I have placed an order for observation for further evaluation management of single episode of syncope.  I have placed some additional preliminary admit orders via the adult multi-morbid admission order set.  I have also ordered troponin to be checked around 8 AM this morning, echocardiogram in the morning, fall precautions orthostatic vital signs x 1 set.  Have also ordered a add on magnesium level ordered potassium chloride 40 mill equivalents p.o. x 1 dose now, and added on a lipase level.  Regarding the patient's hyperglycemia without any evidence of anion gap metabolic acidosis, presenting blood sugar of 428 is slightly average blood sugar of 280 has conveyed by hemoglobin A1c of nearly 11%.  I ordered Accu-Cheks before every meal and at bedtime with moderate dose sliding scale insulin .  I also ordered a one-time additional dose of NovoLog 8 units subcu x 1 dose now, anticipated to occur around 8 AM per scheduled breakfast CBG.     Eva Pore, DO Hospitalist

## 2023-10-20 NOTE — ED Triage Notes (Addendum)
 Pt BIB [facility] from work for a near syncopal episode while sitting. Pt stated she felt like she was fading out and had tunnel vision. Pt had a syncopal episode 2 months ago as well- R/t new diabetes diagnosis. Pt also reports chest pain during exertion.   CBG 433 78 NSR 99 RA 142/74 18 LAC 400 NS

## 2023-10-20 NOTE — ED Notes (Signed)
   10/20/23 0254  Vitals  Patient Position (if appropriate) Orthostatic Vitals  Orthostatic Lying   BP- Lying (!) 157/100  Pulse- Lying 80  Orthostatic Sitting  BP- Sitting (!) 175/114  Pulse- Sitting 76  Orthostatic Standing at 0 minutes  BP- Standing at 0 minutes (!) 159/120  Pulse- Standing at 0 minutes 80

## 2023-10-23 ENCOUNTER — Ambulatory Visit: Admitting: Bariatrics

## 2023-10-30 ENCOUNTER — Encounter: Payer: Self-pay | Admitting: Emergency Medicine

## 2023-10-30 ENCOUNTER — Ambulatory Visit
Admission: RE | Admit: 2023-10-30 | Discharge: 2023-10-30 | Disposition: A | Discharge status: Home / Self Care | Source: Ambulatory Visit

## 2023-10-30 ENCOUNTER — Ambulatory Visit
Admission: RE | Admit: 2023-10-30 | Discharge: 2023-10-30 | Disposition: A | Discharge status: Home / Self Care | Source: Ambulatory Visit | Attending: Student | Admitting: Student

## 2023-10-30 DIAGNOSIS — S39012A Strain of muscle, fascia and tendon of lower back, initial encounter: Secondary | ICD-10-CM | POA: Diagnosis not present

## 2023-10-30 DIAGNOSIS — M170 Bilateral primary osteoarthritis of knee: Secondary | ICD-10-CM

## 2023-10-30 MED ORDER — KETOROLAC TROMETHAMINE 15 MG/ML IJ SOLN
30.0000 mg | Freq: Once | INTRAMUSCULAR | Status: AC
  Administered 2023-10-30: 30 mg via INTRAMUSCULAR

## 2023-10-30 NOTE — ED Triage Notes (Signed)
 Pt presents c/o knee pain and back pain x 4 weeks. Pt states,  I just want a Toradol  inj. The last one lasted a while but it has worn off and the aches and pains are back. The last one was in April or May this year.  Pt denies injury and/or fall.

## 2023-10-30 NOTE — Discharge Instructions (Signed)
-   We administered a shot of Toradol  today.  This is an NSAID pain medication, similar to ibuprofen . - Because we administered the Toradol  today, please avoid NSAIDs like ibuprofen  or Advil  for the rest of the day. -You can still take Tylenol  1000mg  up to 3x daily -Follow-up with weight management and ortho as planned

## 2023-10-30 NOTE — ED Provider Notes (Signed)
 [name]    [name]: [id number] Arrival date & time: 10/30/23  9057      History   Chief Complaint Chief Complaint  Patient presents with   Medication Refill    HPI Jaime Castaneda is a 53 y.o. female presenting with back and knee pain.  Medical history bilateral knee arthritis, degenerative disc disease (per patient), pulmonary embolism, asthma, chronic fatigue syndrome, diabetes.  - Patient describes chronic back pain, with current exacerbation.  Denies current radiation of the pain, though she does sometimes have left sciatica.  Denies shortness of breath, pedal edema. Denies urinary or bowel symptoms. - Bilateral knee arthritis.  She is followed by Ortho for this.  She needs a bilateral knee replacement, but this will have to wait until she has achieved weight loss goal.  She is also following with weight management, and hoping to start GLP-1 medication. - She has had similar symptoms in the past, which have been well-managed with Toradol  injection.  She does not have a history of kidney disease.  HPI  Past Medical History:  Diagnosis Date   Anemia    Anxiety    Arthritis of knee    bilateral   Asthma    Back pain    CFS (chronic fatigue syndrome)    Constipation    Depression    Diabetes mellitus without complication (HCC)    Dyspnea    Fibroid    GERD (gastroesophageal reflux disease)    Joint pain    Lower extremity edema    Metabolic syndrome    Migraine    Obesity    Prediabetes    Pulmonary embolus (HCC)    Scleritis    TMJ arthralgia    Vitamin D deficiency     Patient Active Problem List   Diagnosis Date Noted   Syncope 10/20/2023   Dizzy spells 01/26/2023   History of panic attacks 01/26/2023   TMJ (temporomandibular joint disorder) 01/26/2023   DOE (dyspnea on exertion) 01/26/2023   Primary osteoarthritis of both knees 10/08/2021   Chronic bilateral low back pain with bilateral sciatica 11/16/2020   Sleep-disordered breathing  11/16/2020   Primary insomnia 06/22/2020   Bleeding 03/24/2020   Gastroesophageal reflux disease without esophagitis 03/16/2020   Primary hypertension 03/16/2020   Snoring 03/16/2020   Vitamin B12 deficiency 03/16/2020   History of pulmonary embolism 03/02/2020   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 without macular edema, without long-term current use of insulin  (HCC) 03/02/2020   Menorrhagia 02/17/2020   Pulmonary emboli (HCC) 02/17/2020   Other chest pain    Shortness of breath    Vaginal bleeding    Hypertensive retinopathy of both eyes 09/26/2017   Nuclear sclerotic cataract of both eyes 09/26/2017   Autoimmune deficiency syndrome 09/16/2016   H/O Bell's palsy 09/16/2016   Scleritis 09/16/2016   Morbid obesity (HCC) 06/27/2015   Severe headache 06/27/2015   Vision changes 06/27/2015   Metabolic syndrome 06/27/2015   Migraine 06/27/2015   Mild intermittent asthma, uncomplicated 04/13/2015   Adjustment disorder with anxiety 02/14/2014   BV (bacterial vaginosis) 02/14/2014   Iron deficiency anemia 10/23/2012   Adjustment insomnia 09/26/2012   Candidal vulvovaginitis 06/09/2012   Elevated blood pressure reading 10/18/2010    Past Surgical History:  Procedure Laterality Date   ABLATION     uterine   SHOULDER SURGERY     TUBAL LIGATION  2003    OB History     Gravida  2   Para  2   Term  2   Preterm      AB      Living  2      SAB      IAB      Ectopic      Multiple      Live Births               Home Medications    Prior to Admission medications   Medication Sig Start Date End Date Taking? Authorizing Provider  albuterol  (VENTOLIN  HFA) 108 (90 Base) MCG/ACT inhaler Inhale 2 puffs into the lungs every 6 (six) hours as needed for wheezing or shortness of breath.    [provider]  DULoxetine  (CYMBALTA ) 30 MG capsule Take 1 capsule (30 mg total) by mouth daily. 07/26/23 10/24/23   Lorren Greig PARAS, NP  fluticasone  (FLONASE ) 50 MCG/ACT nasal spray Place 1 spray into both nostrils as needed for allergies. 06/22/20   [provider]  glipiZIDE  (GLUCOTROL  XL) 10 MG 24 hr tablet Take 1 tablet (10 mg total) by mouth daily. 07/26/23 10/24/23  Lorren Greig PARAS, NP  hydrOXYzine  (ATARAX ) 25 MG tablet Take 25 mg by mouth every 8 (eight) hours as needed for anxiety. 04/26/21   [provider]  ibuprofen  (ADVIL ) 200 MG tablet Take 200 mg by mouth every 6 (six) hours as needed. 02/14/20   [provider]  insulin  glargine (LANTUS ) 100 UNIT/ML Solostar Pen Inject 10 Units into the skin daily. 07/26/23   Lorren Greig PARAS, NP  losartan -hydrochlorothiazide (HYZAAR) 50-12.5 MG tablet Take 1 tablet by mouth daily. 07/26/23 10/24/23  Lorren Greig PARAS, NP  MYRBETRIQ 25 MG TB24 tablet Take 25 mg by mouth daily. 10/06/23   [provider]  ondansetron  (ZOFRAN  ODT) 4 MG disintegrating tablet Take 1 tablet (4 mg total) by mouth every 8 (eight) hours as needed for nausea or vomiting. 04/18/20   Roselyn Carlin NOVAK, MD    Family History Family History  Problem Relation Age of Onset   Diabetes Mother    Hypertension Mother    Heart disease Mother    Depression Mother    Anxiety disorder Mother    Obesity Mother    Heart disease Other    Thyroid disease Other    Bone cancer Other    High blood pressure Other    Migraines Neg Hx     Social History Social History   Tobacco Use   Smoking status: Never    Passive exposure: Never   Smokeless tobacco: Never  Vaping Use   Vaping status: Never Used  Substance Use Topics   Alcohol use: No   Drug use: No     Allergies   Ferric carboxymaltose, Sulfa  antibiotics, Cephalexin , and Moxifloxacin   Review of Systems Review of Systems  Musculoskeletal:  Positive for back pain.       Bilateral knee pain     Physical Exam Triage Vital Signs ED Triage Vitals  Encounter Vitals Group     BP 10/30/23 1008 (!) 135/94      Girls Systolic BP Percentile --      Girls Diastolic BP Percentile --      Boys Systolic BP Percentile --      Boys Diastolic BP Percentile --      Pulse Rate 10/30/23 1008 76     Resp 10/30/23 1008 (!) 22     Temp 10/30/23 1008 97.7 F (36.5 C)  Temp Source 10/30/23 1008 Oral     SpO2 10/30/23 1008 96 %     Weight 10/30/23 1007 (!) 349 lb 6.9 oz (158.5 kg)     Height --      Head Circumference --      Peak Flow --      Pain Score 10/30/23 1006 8     Pain Loc --      Pain Education --      Exclude from Growth Chart --    No data found.  Updated Vital Signs BP (!) 135/94 (BP Location: Left Arm)   Pulse 76   Temp 97.7 F (36.5 C) (Oral)   Resp (!) 22   Wt (!) 349 lb 6.9 oz (158.5 kg)   LMP  (LMP Unknown)   SpO2 96%   BMI 54.73 kg/m   Visual Acuity Right Eye Distance:   Left Eye Distance:   Bilateral Distance:    Right Eye Near:   Left Eye Near:    Bilateral Near:     Physical Exam Vitals reviewed.  Constitutional:      General: She is not in acute distress.    Appearance: Normal appearance. She is not ill-appearing.  HENT:     Head: Normocephalic and atraumatic.  Cardiovascular:     Rate and Rhythm: Normal rate and regular rhythm.     Heart sounds: Normal heart sounds.  Pulmonary:     Effort: Pulmonary effort is normal.     Breath sounds: Normal breath sounds and air entry.  Abdominal:     Tenderness: There is no abdominal tenderness. There is no right CVA tenderness, left CVA tenderness, guarding or rebound.  Musculoskeletal:     Cervical back: Normal range of motion. No swelling, deformity, signs of trauma, rigidity, spasms, tenderness, bony tenderness or crepitus. No pain with movement.     Thoracic back: No swelling, deformity, signs of trauma, spasms, tenderness or bony tenderness. Normal range of motion. No scoliosis.     Lumbar back: Spasms and tenderness present. No swelling, deformity, signs of trauma or bony tenderness. Normal range of  motion. Negative right straight leg raise test and negative left straight leg raise test. No scoliosis.     Comments: Lumbar paraspinous tenderness and spasms elicited with going from sitting to standing, and forward flexion of lumbar spine. No bony midline spinous tenderness, deformity, stepoff.  Strength and sensation intact upper and lower extremities.  Gait intact, but with pain.  Bilateral knees with crepitus and stiffness.  Knees are equal and symmetric.  There is no joint laxity.  Calves are equal and symmetric, without tenderness, swelling, or venous distention.  Neurological:     General: No focal deficit present.     Mental Status: She is alert.     Cranial Nerves: No cranial nerve deficit.  Psychiatric:        Mood and Affect: Mood normal.        Behavior: Behavior normal.        Thought Content: Thought content normal.        Judgment: Judgment normal.      UC Treatments / Results  Labs (all labs ordered are listed, but only abnormal results are displayed) Labs Reviewed - No data to display  EKG   Radiology No results found.  Procedures Procedures (including critical care time)  Medications Ordered in UC Medications  ketorolac  (TORADOL ) 15 MG/ML injection 30 mg (has no administration in time range)    Initial Impression /  Assessment and Plan / UC Course  I have reviewed the triage vital signs and the nursing notes.  Pertinent labs & imaging results that were available during my care of the patient were reviewed by me and considered in my medical decision making (see chart for details).     Patient is a pleasant 53 year old female presenting with lumbar strain and knee arthritis.  No red flag symptoms.  No recent trauma. She does not have a history of kidney disease.  GFR >60 on 10/2023 labwork.  Her knees and calves are equal and symmetric, wells score for DVT is -1 (low risk) due to prior h/o PE.  Will manage with Toradol  IM.  Continue to follow with Ortho and  weight management.   Final Clinical Impressions(s) / UC Diagnoses   Final diagnoses:  Bilateral primary osteoarthritis of knee  Strain of lumbar region, initial encounter     Discharge Instructions      - We administered a shot of Toradol  today.  This is an NSAID pain medication, similar to ibuprofen . - Because we administered the Toradol  today, please avoid NSAIDs like ibuprofen  or Advil  for the rest of the day. -You can still take Tylenol  1000mg  up to 3x daily -Follow-up with weight management and ortho as planned     ED Prescriptions   None    PDMP not reviewed this encounter.   Arlyss Leita BRAVO, PA-C 10/30/23 1047

## 2023-11-06 ENCOUNTER — Encounter: Payer: Self-pay | Admitting: Radiology

## 2023-11-06 ENCOUNTER — Ambulatory Visit: Admitting: Bariatrics

## 2023-11-14 ENCOUNTER — Ambulatory Visit (INDEPENDENT_AMBULATORY_CARE_PROVIDER_SITE_OTHER): Payer: Self-pay | Admitting: Nurse Practitioner

## 2023-11-14 ENCOUNTER — Encounter (INDEPENDENT_AMBULATORY_CARE_PROVIDER_SITE_OTHER): Payer: Self-pay

## 2023-11-14 ENCOUNTER — Ambulatory Visit: Admitting: Physician Assistant

## 2023-11-28 ENCOUNTER — Ambulatory Visit (INDEPENDENT_AMBULATORY_CARE_PROVIDER_SITE_OTHER): Payer: Self-pay | Admitting: Nurse Practitioner

## 2023-12-10 ENCOUNTER — Other Ambulatory Visit: Payer: Self-pay | Admitting: Family

## 2023-12-10 DIAGNOSIS — R739 Hyperglycemia, unspecified: Secondary | ICD-10-CM

## 2023-12-10 DIAGNOSIS — E1165 Type 2 diabetes mellitus with hyperglycemia: Secondary | ICD-10-CM

## 2023-12-11 NOTE — Telephone Encounter (Signed)
 Complete
